# Patient Record
Sex: Female | Born: 1995 | Race: Black or African American | Hispanic: No | Marital: Single | Smoking: Never smoker
Health system: Southern US, Community
[De-identification: ages and names within clinical notes are randomized; demographics above are authoritative.]

## PROBLEM LIST (undated history)

## (undated) ENCOUNTER — Inpatient Hospital Stay (HOSPITAL_COMMUNITY): Payer: Self-pay

## (undated) ENCOUNTER — Ambulatory Visit (HOSPITAL_COMMUNITY): Admission: EM | Discharge status: Absent without leave | Payer: Managed Care, Other (non HMO)

## (undated) DIAGNOSIS — A549 Gonococcal infection, unspecified: Secondary | ICD-10-CM

## (undated) DIAGNOSIS — B999 Unspecified infectious disease: Secondary | ICD-10-CM

## (undated) DIAGNOSIS — A599 Trichomoniasis, unspecified: Secondary | ICD-10-CM

## (undated) DIAGNOSIS — N200 Calculus of kidney: Secondary | ICD-10-CM

## (undated) DIAGNOSIS — N390 Urinary tract infection, site not specified: Secondary | ICD-10-CM

## (undated) DIAGNOSIS — Z206 Contact with and (suspected) exposure to human immunodeficiency virus [HIV]: Secondary | ICD-10-CM

## (undated) HISTORY — PX: INDUCED ABORTION: SHX677

## (undated) HISTORY — DX: Contact with and (suspected) exposure to human immunodeficiency virus (hiv): Z20.6

## (undated) HISTORY — PX: WISDOM TOOTH EXTRACTION: SHX21

## (undated) HISTORY — DX: Urinary tract infection, site not specified: N39.0

---
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
## 2013-09-26 ENCOUNTER — Encounter (HOSPITAL_COMMUNITY): Payer: Self-pay | Admitting: Emergency Medicine

## 2013-09-26 ENCOUNTER — Other Ambulatory Visit (HOSPITAL_COMMUNITY)
Admission: RE | Admit: 2013-09-26 | Discharge: 2013-09-26 | Disposition: A | Discharge status: Home / Self Care | Payer: Managed Care, Other (non HMO) | Source: Ambulatory Visit | Attending: Family Medicine | Admitting: Family Medicine

## 2013-09-26 ENCOUNTER — Emergency Department (INDEPENDENT_AMBULATORY_CARE_PROVIDER_SITE_OTHER)
Admission: EM | Admit: 2013-09-26 | Discharge: 2013-09-26 | Disposition: A | Discharge status: Home / Self Care | Payer: Managed Care, Other (non HMO) | Source: Home / Self Care

## 2013-09-26 ENCOUNTER — Emergency Department (INDEPENDENT_AMBULATORY_CARE_PROVIDER_SITE_OTHER): Payer: Managed Care, Other (non HMO)

## 2013-09-26 DIAGNOSIS — N760 Acute vaginitis: Secondary | ICD-10-CM | POA: Insufficient documentation

## 2013-09-26 DIAGNOSIS — R109 Unspecified abdominal pain: Secondary | ICD-10-CM

## 2013-09-26 DIAGNOSIS — Z113 Encounter for screening for infections with a predominantly sexual mode of transmission: Secondary | ICD-10-CM | POA: Insufficient documentation

## 2013-09-26 DIAGNOSIS — R52 Pain, unspecified: Secondary | ICD-10-CM

## 2013-09-26 LAB — POCT URINALYSIS DIP (DEVICE)
Bilirubin Urine: NEGATIVE
Glucose, UA: NEGATIVE mg/dL
Ketones, ur: NEGATIVE mg/dL
Nitrite: NEGATIVE
Protein, ur: 30 mg/dL — AB
Specific Gravity, Urine: >=1.03 (ref 1.005–1.030)
Urobilinogen, UA: 1 mg/dL (ref 0.0–1.0)
pH: 5.5 (ref 5.0–8.0)

## 2013-09-26 LAB — POCT PREGNANCY, URINE: Preg Test, Ur: NEGATIVE

## 2013-09-26 MED ORDER — CIPROFLOXACIN HCL 500 MG PO TABS: 500.0000 mg | ORAL_TABLET | Freq: Two times a day (BID) | ORAL | Status: DC

## 2013-09-26 MED ORDER — ALIGN 4 MG PO CAPS: 1.0000 | ORAL_CAPSULE | Freq: Every day | ORAL | Status: DC

## 2013-09-26 MED ORDER — CEFTRIAXONE SODIUM 1 G IJ SOLR
1.0000 g | Freq: Once | INTRAMUSCULAR | Status: AC
  Administered 2013-09-26: 1 g via INTRAMUSCULAR

## 2013-09-26 MED ORDER — ACETAMINOPHEN 325 MG PO TABS
650.0000 mg | ORAL_TABLET | Freq: Once | ORAL | Status: AC
  Administered 2013-09-26: 650 mg via ORAL

## 2013-09-26 MED ORDER — CEFTRIAXONE SODIUM 1 G IJ SOLR
INTRAMUSCULAR | Status: AC
  Filled 2013-09-26: qty 10

## 2013-09-26 MED ORDER — ACETAMINOPHEN 325 MG PO TABS
ORAL_TABLET | ORAL | Status: AC
  Filled 2013-09-26: qty 2

## 2013-09-26 MED ORDER — LIDOCAINE HCL (PF) 1 % IJ SOLN
INTRAMUSCULAR | Status: AC
  Filled 2013-09-26: qty 5

## 2013-09-26 NOTE — ED Notes (Signed)
Call back number for lab issues verified 

## 2013-09-26 NOTE — Discharge Instructions (Signed)
Pyelonephritis, Adult °Pyelonephritis is a kidney infection. In general, there are 2 main types of pyelonephritis: °· Infections that come on quickly without any warning (acute pyelonephritis). °· Infections that persist for a long period of time (chronic pyelonephritis). °CAUSES  °Two main causes of pyelonephritis are: °· Bacteria traveling from the bladder to the kidney. This is a problem especially in pregnant women. The urine in the bladder can become filled with bacteria from multiple causes, including: °¨ Inflammation of the prostate gland (prostatitis). °¨ Sexual intercourse in females. °¨ Bladder infection (cystitis). °· Bacteria traveling from the bloodstream to the tissue part of the kidney. °Problems that may increase your risk of getting a kidney infection include: °· Diabetes. °· Kidney stones or bladder stones. °· Cancer. °· Catheters placed in the bladder. °· Other abnormalities of the kidney or ureter. °SYMPTOMS  °· Abdominal pain. °· Pain in the side or flank area. °· Fever. °· Chills. °· Upset stomach. °· Blood in the urine (dark urine). °· Frequent urination. °· Strong or persistent urge to urinate. °· Burning or stinging when urinating. °DIAGNOSIS  °Your caregiver may diagnose your kidney infection based on your symptoms. A urine sample may also be taken. °TREATMENT  °In general, treatment depends on how severe the infection is.  °· If the infection is mild and caught early, your caregiver may treat you with oral antibiotics and send you home. °· If the infection is more severe, the bacteria may have gotten into the bloodstream. This will require intravenous (IV) antibiotics and a hospital stay. Symptoms may include: °¨ High fever. °¨ Severe flank pain. °¨ Shaking chills. °· Even after a hospital stay, your caregiver may require you to be on oral antibiotics for a period of time. °· Other treatments may be required depending upon the cause of the infection. °HOME CARE INSTRUCTIONS  °· Take your  antibiotics as directed. Finish them even if you start to feel better. °· Make an appointment to have your urine checked to make sure the infection is gone. °· Drink enough fluids to keep your urine clear or pale yellow. °· Take medicines for the bladder if you have urgency and frequency of urination as directed by your caregiver. °SEEK IMMEDIATE MEDICAL CARE IF:  °· You have a fever or persistent symptoms for more than 2-3 days. °· You have a fever and your symptoms suddenly get worse. °· You are unable to take your antibiotics or fluids. °· You develop shaking chills. °· You experience extreme weakness or fainting. °· There is no improvement after 2 days of treatment. °MAKE SURE YOU: °· Understand these instructions. °· Will watch your condition. °· Will get help right away if you are not doing well or get worse. °Document Released: 02/18/2005 Document Revised: 08/20/2011 Document Reviewed: 07/25/2010 °ExitCare® Patient Information ©2015 [employer]. This information is not intended to replace advice given to you by your health care provider. Make sure you discuss any questions you have with your health care provider. ° °

## 2013-09-26 NOTE — ED Notes (Signed)
Concern for poss STD; has pain w urination and vaginal d/c; NAD

## 2013-09-26 NOTE — ED Provider Notes (Signed)
CSN: [id number]     Arrival date & time 09/26/13  1140 History   None    Chief Complaint  Patient presents with  . SEXUALLY TRANSMITTED DISEASE   (Consider location/radiation/quality/duration/timing/severity/associated sxs/prior Treatment)  HPI  Patient is a 18 year old female presenting today with complaints of left-sided abdominal pain.  Patient states she had an onset of abdominal pain with fever and blood in her urine approximately 2 weeks ago. Patient states she thought it was "getting better", but now has left flank pain.  History reviewed. No pertinent past medical history. History reviewed. No pertinent past surgical history. History reviewed. No pertinent family history. History  Substance Use Topics  . Smoking status: Passive Smoke Exposure - Never Smoker  . Smokeless tobacco: Not on file  . Alcohol Use: No   OB History   Grav Para Term Preterm Abortions TAB SAB Ect Mult Living   1              Review of Systems  Constitutional: Positive for fever, chills and fatigue.  HENT: Negative.   Eyes: Negative.   Respiratory: Negative.  Negative for shortness of breath.   Gastrointestinal: Positive for abdominal pain. Negative for nausea, vomiting, diarrhea and constipation.  Endocrine: Negative.   Genitourinary: Positive for vaginal discharge. Negative for dysuria, vaginal pain, menstrual problem and pelvic pain.       Patient denies burning or frequency with urination.  Reports some vaginal discharge, with history of unprotected sex.  Musculoskeletal: Negative.   Skin: Negative.   Allergic/Immunologic: Negative.   Neurological: Negative.   Hematological: Negative.   Psychiatric/Behavioral: Negative.     Allergies  Review of patient's allergies indicates no known allergies.  Home Medications   Prior to Admission medications   Medication Sig Start Date End Date Taking? Authorizing Provider  ciprofloxacin (CIPRO) 500 MG tablet Take 1 tablet (500 mg total) by mouth 2  (two) times daily. 09/26/13   Weber Cooksatherine Charlane Westry, NP  Probiotic Product (ALIGN) 4 MG CAPS Take 1 capsule (4 mg total) by mouth daily. 09/26/13   Weber Cooksatherine Tamre Cass, NP   BP 114/65  Pulse 105  Temp(Src) 101.5 F (38.6 C) (Oral)  Resp 24  SpO2 99%  LMP 09/19/2013  Breastfeeding? Unknown  Physical Exam  Nursing note and vitals reviewed. Cardiovascular: Regular rhythm, normal heart sounds and intact distal pulses.  Exam reveals no gallop and no friction rub.   No murmur heard. Patient is tachycardic, most likely related to fever.  Pulmonary/Chest: Effort normal and breath sounds normal. No respiratory distress. She has no wheezes. She has no rales. She exhibits no tenderness.  Abdominal: Soft. Bowel sounds are normal. She exhibits no distension and no mass. There is tenderness. There is no rebound and no guarding.  Patient reports tenderness with palpation of left kidney. Positive for CVA tenderness on left side.  Genitourinary: Vagina normal and uterus normal.  Patient has nulliparous appearing cervix consistent with a retroverted uterus. Scant amount of white discharge noted in posterior fornix. Negative for cervical motion tenderness. No evidence of discomfort with palpation of either ovary or fallopian tube.    Skin: Skin is warm and dry.    ED Course  Procedures (including critical care time) Labs Review Labs Reviewed  POCT URINALYSIS DIP (DEVICE) - Abnormal; Notable for the following:    Hgb urine dipstick TRACE (*)    Protein, ur 30 (*)    Leukocytes, UA TRACE (*)    All other components within normal limits  URINE CULTURE  POCT PREGNANCY, URINE  CERVICOVAGINAL ANCILLARY ONLY   Results for orders placed during the hospital encounter of 09/26/13  POCT URINALYSIS DIP (DEVICE)      Result Value Ref Range   Glucose, UA NEGATIVE  NEGATIVE mg/dL   Bilirubin Urine NEGATIVE  NEGATIVE   Ketones, ur NEGATIVE  NEGATIVE mg/dL   Specific Gravity, Urine >=1.030  1.005 - 1.030   Hgb urine  dipstick TRACE (*) NEGATIVE   pH 5.5  5.0 - 8.0   Protein, ur 30 (*) NEGATIVE mg/dL   Urobilinogen, UA 1.0  0.0 - 1.0 mg/dL   Nitrite NEGATIVE  NEGATIVE   Leukocytes, UA TRACE (*) NEGATIVE  POCT PREGNANCY, URINE      Result Value Ref Range   Preg Test, Ur NEGATIVE  NEGATIVE    Imaging Review Dg Abd 1 View  09/26/2013   CLINICAL DATA:  Lower abdominal pain  EXAM: ABDOMEN - 1 VIEW  COMPARISON:  None.  FINDINGS: There is moderate stool throughout the colon. The bowel gas pattern is unremarkable. No obstruction or free air is seen on this supine examination. There is a probable small phlebolith in the lower left pelvis.  IMPRESSION: Moderate stool in colon.  Overall bowel gas pattern unremarkable.   Electronically Signed   By: Bretta Bang M.D.   On: 09/26/2013 13:19    MDM   1. Flank pain, acute    Examination is consistent with probable pyelonephritis. Dr. Denyse Amass consulted on plan of care. Patient be treated with IM Rocephin and Cipro. Patient advised to return if symptoms fail to improve or worsen. Tylenol as needed for fever and discomfort. The patient verbalizes understanding and agrees to plan of care.       Weber Cooks, NP 09/26/13 1408

## 2013-09-27 LAB — URINE CULTURE: Colony Count: 4000

## 2013-09-28 NOTE — ED Notes (Addendum)
GC/chlamydia neg., Affirm: Candida and Trich neg., Gardnerella pos. Message sent to Dr. Denyse Amassorey. Vassie MoselleYork, Madeline Wilkins M 09/28/2013 Dr. Denyse Amassorey wrote that he called in Rx. and notified the pt. 10/01/2013

## 2013-09-30 NOTE — ED Provider Notes (Signed)
Medical screening examination/treatment/procedure(s) were performed by a resident physician or non-physician practitioner and as the supervising physician I was immediately available for consultation/collaboration.  Dirk Vanaman, MD    Tyshauna Finkbiner S Lanecia Sliva, MD 09/30/13 0752 

## 2013-10-01 ENCOUNTER — Telehealth (HOSPITAL_COMMUNITY): Payer: Self-pay | Admitting: Family Medicine

## 2013-10-01 MED ORDER — METRONIDAZOLE 500 MG PO TABS: 500.0000 mg | ORAL_TABLET | Freq: Two times a day (BID) | ORAL | Status: DC

## 2013-10-01 NOTE — ED Notes (Signed)
Flagyl called in for BV  Rodolph BongEvan S Casey Maxfield, MD 10/01/13 [phone]

## 2013-10-11 ENCOUNTER — Telehealth (HOSPITAL_COMMUNITY): Payer: Self-pay | Admitting: *Deleted

## 2014-01-03 ENCOUNTER — Encounter (HOSPITAL_COMMUNITY): Payer: Self-pay | Admitting: Emergency Medicine

## 2014-03-15 ENCOUNTER — Other Ambulatory Visit (HOSPITAL_COMMUNITY)
Admission: RE | Admit: 2014-03-15 | Discharge: 2014-03-15 | Disposition: A | Discharge status: Home / Self Care | Payer: Managed Care, Other (non HMO) | Source: Ambulatory Visit | Attending: Emergency Medicine | Admitting: Emergency Medicine

## 2014-03-15 ENCOUNTER — Emergency Department (INDEPENDENT_AMBULATORY_CARE_PROVIDER_SITE_OTHER)
Admission: EM | Admit: 2014-03-15 | Discharge: 2014-03-15 | Disposition: A | Discharge status: Home / Self Care | Payer: Managed Care, Other (non HMO) | Source: Home / Self Care | Attending: Emergency Medicine | Admitting: Emergency Medicine

## 2014-03-15 ENCOUNTER — Encounter (HOSPITAL_COMMUNITY): Payer: Self-pay | Admitting: *Deleted

## 2014-03-15 DIAGNOSIS — N760 Acute vaginitis: Secondary | ICD-10-CM

## 2014-03-15 DIAGNOSIS — Z113 Encounter for screening for infections with a predominantly sexual mode of transmission: Secondary | ICD-10-CM | POA: Diagnosis present

## 2014-03-15 LAB — POCT PREGNANCY, URINE: Preg Test, Ur: NEGATIVE

## 2014-03-15 MED ORDER — METRONIDAZOLE 500 MG PO TABS: 500.0000 mg | ORAL_TABLET | Freq: Two times a day (BID) | ORAL | Status: DC

## 2014-03-15 NOTE — ED Notes (Signed)
3 gold tubes drawn for HIV/RPR, labeled and taken to the lab.

## 2014-03-15 NOTE — ED Notes (Signed)
STates she wants a pregnancy check.  LMP 11/18.  Missed period in Dec. Tried [facility] pregnancy test  and was neg. X 2.  Also wants checked for STD.  C/o vaginal discharge for 1 week.  D/C is white and creamy.  No UTI symptoms.

## 2014-03-15 NOTE — ED Provider Notes (Signed)
   Chief Complaint   Exposure to STD and Possible Pregnancy   History of Present Illness   Madeline Wilkins is a 19 year old female who comes in tonight for a pregnancy test also be checked for STDs and vaginal discharge and her last menstrual period was November 17 or 18th. She is sexually active with inconsistent use of condoms. She denies any pregnancy symptoms such as morning sickness, breast tenderness, breast swelling. She also has had a one-week history of a white vaginal discharge. She denies any itching or odor. No pelvic or lower back pain. No urinary symptoms. No fever, chills, nausea, or vomiting. The patient has a history of BV in the past and Chlamydia or gonorrhea. She had a pregnancy termination about a year ago.  Review of Systems   Other than as noted above, the patient denies any of the following symptoms: Systemic:  No fever or chills GI:  No abdominal pain, nausea, vomiting, diarrhea, constipation, melena or hematochezia. GU:  No dysuria, frequency, urgency, hematuria, vaginal discharge, itching, or abnormal vaginal bleeding.  PMFSH   Past medical history, family history, social history, meds, and allergies were reviewed.    Physical Examination    Vital signs:  BP 118/58 mmHg  Pulse 82  Temp(Src) 98.4 F (36.9 C) (Oral)  Resp 18  SpO2 100%  LMP 01/21/2014  Breastfeeding? No General:  Alert, oriented and in no distress. Lungs:  Breath sounds clear and equal bilaterally.  No wheezes, rales or rhonchi. Heart:  Regular rhythm.  No gallops or murmers. Abdomen:  Soft, flat and non-distended.  No organomegaly or mass.  No tenderness, guarding or rebound.  Bowel sounds normally active. Pelvic exam:  Normal external genitalia, vaginal and cervical mucosa were normal. There was a small amount of white discharge, this was non-frothy and non-malodorous. No pain on cervical motion. Uterus was normal in size and shape and nontender. No adnexal masses or tenderness.  DNA probes  for gonorrhea, Chlamydia, Trichomonas, Gardnerella, Candida were obtained. Skin:  Clear, warm and dry.  Chaperoned by Cherly AndersonSuzanne York, RN who was present throughout the pelvic exam.   Labs   Results for orders placed or performed during the hospital encounter of 03/15/14  Pregnancy, urine POC  Result Value Ref Range   Preg Test, Ur NEGATIVE NEGATIVE    Serologies for HIV and RPR were obtained.  Assessment   The encounter diagnosis was Vaginitis.       Plan    1.  Meds:  The following meds were prescribed:   Discharge Medication List as of 03/15/2014  6:58 PM    START taking these medications   Details  !! metroNIDAZOLE (FLAGYL) 500 MG tablet Take 1 tablet (500 mg total) by mouth 2 (two) times daily., Starting 03/15/2014, Until Discontinued, Normal     !! - Potential duplicate medications found. Please discuss with provider.      2.  Patient Education/Counseling:  The patient was given appropriate handouts, self care instructions, and instructed in symptomatic relief.    3.  Follow up:  The patient was told to follow up here if no better in 3 to 4 days, or sooner if becoming worse in any way, and given some red flag symptoms such as worsening pain, fever, persistent vomiting, or heavy vaginal bleeding which would prompt immediate return.       Reuben Likesavid C Lige Lakeman, MD 03/15/14 [phone]

## 2014-03-15 NOTE — Discharge Instructions (Signed)
Bacterial Vaginosis °Bacterial vaginosis is a vaginal infection that occurs when the normal balance of bacteria in the vagina is disrupted. It results from an overgrowth of certain bacteria. This is the most common vaginal infection in women of childbearing age. Treatment is important to prevent complications, especially in pregnant women, as it can cause a premature delivery. °CAUSES  °Bacterial vaginosis is caused by an increase in harmful bacteria that are normally present in smaller amounts in the vagina. Several different kinds of bacteria can cause bacterial vaginosis. However, the reason that the condition develops is not fully understood. °RISK FACTORS °Certain activities or behaviors can put you at an increased risk of developing bacterial vaginosis, including: °· Having a new sex partner or multiple sex partners. °· Douching. °· Using an intrauterine device (IUD) for contraception. °Women do not get bacterial vaginosis from toilet seats, bedding, swimming pools, or contact with objects around them. °SIGNS AND SYMPTOMS  °Some women with bacterial vaginosis have no signs or symptoms. Common symptoms include: °· Grey vaginal discharge. °· A fishlike odor with discharge, especially after sexual intercourse. °· Itching or burning of the vagina and vulva. °· Burning or pain with urination. °DIAGNOSIS  °Your health care provider will take a medical history and examine the vagina for signs of bacterial vaginosis. A sample of vaginal fluid may be taken. Your health care provider will look at this sample under a microscope to check for bacteria and abnormal cells. A vaginal pH test may also be done.  °TREATMENT  °Bacterial vaginosis may be treated with antibiotic medicines. These may be given in the form of a pill or a vaginal cream. A second round of antibiotics may be prescribed if the condition comes back after treatment.  °HOME CARE INSTRUCTIONS  °· Only take over-the-counter or prescription medicines as  directed by your health care provider. °· If antibiotic medicine was prescribed, take it as directed. Make sure you finish it even if you start to feel better. °· Do not have sex until treatment is completed. °· Tell all sexual partners that you have a vaginal infection. They should see their health care provider and be treated if they have problems, such as a mild rash or itching. °· Practice safe sex by using condoms and only having one sex partner. °SEEK MEDICAL CARE IF:  °· Your symptoms are not improving after 3 days of treatment. °· You have increased discharge or pain. °· You have a fever. °MAKE SURE YOU:  °· Understand these instructions. °· Will watch your condition. °· Will get help right away if you are not doing well or get worse. °FOR MORE INFORMATION  °Centers for Disease Control and Prevention, Division of STD Prevention: www.cdc.gov/std °American Sexual Health Association (ASHA): www.ashastd.org  °Document Released: 02/18/2005 Document Revised: 12/09/2012 Document Reviewed: 09/30/2012 °ExitCare® Patient Information ©2015 [employer]. This information is not intended to replace advice given to you by your health care provider. Make sure you discuss any questions you have with your health care provider. ° °To restore the normal balance of "good bacteria" in your system.  Take a probiotic once daily.  These can be gotten over the counter at the drug store without a prescription and come under various brand names such as Culturelle, Align, Florastore, and Phillips.  The best thing to do is to ask your pharmacist to recommend a good probiotic that is not too expensive.  ° °

## 2014-03-16 LAB — CERVICOVAGINAL ANCILLARY ONLY
Chlamydia: NEGATIVE
Neisseria Gonorrhea: NEGATIVE

## 2014-03-17 LAB — CERVICOVAGINAL ANCILLARY ONLY
Wet Prep (BD Affirm): NEGATIVE
Wet Prep (BD Affirm): NEGATIVE
Wet Prep (BD Affirm): POSITIVE — AB

## 2014-03-17 LAB — RPR: RPR Ser Ql: NONREACTIVE

## 2014-03-17 LAB — HIV ANTIBODY (ROUTINE TESTING W REFLEX)
HIV 1/O/2 Abs-Index Value: <1 (ref <1.00)
HIV-1/HIV-2 Ab: NONREACTIVE

## 2014-03-19 NOTE — ED Notes (Addendum)
GC/Chlamydia neg., Affirm: Candida and Trich neg., Gardnerella pos., HIV/RPR non-reactive.  Pt. adequately treated with Flagyl. Madeline Wilkins, Cameryn Chrisley M 03/19/2014

## 2014-08-26 ENCOUNTER — Other Ambulatory Visit (HOSPITAL_COMMUNITY)
Admission: RE | Admit: 2014-08-26 | Discharge: 2014-08-26 | Disposition: A | Discharge status: Home / Self Care | Payer: Managed Care, Other (non HMO) | Source: Ambulatory Visit | Attending: Family Medicine | Admitting: Family Medicine

## 2014-08-26 ENCOUNTER — Emergency Department (INDEPENDENT_AMBULATORY_CARE_PROVIDER_SITE_OTHER)
Admission: EM | Admit: 2014-08-26 | Discharge: 2014-08-26 | Disposition: A | Discharge status: Home / Self Care | Payer: Managed Care, Other (non HMO) | Source: Home / Self Care | Attending: Family Medicine | Admitting: Family Medicine

## 2014-08-26 ENCOUNTER — Encounter (HOSPITAL_COMMUNITY): Payer: Self-pay | Admitting: Emergency Medicine

## 2014-08-26 DIAGNOSIS — N760 Acute vaginitis: Secondary | ICD-10-CM | POA: Diagnosis present

## 2014-08-26 DIAGNOSIS — Z113 Encounter for screening for infections with a predominantly sexual mode of transmission: Secondary | ICD-10-CM | POA: Diagnosis present

## 2014-08-26 DIAGNOSIS — A749 Chlamydial infection, unspecified: Secondary | ICD-10-CM

## 2014-08-26 LAB — POCT PREGNANCY, URINE: Preg Test, Ur: NEGATIVE

## 2014-08-26 MED ORDER — LIDOCAINE HCL (PF) 1 % IJ SOLN
INTRAMUSCULAR | Status: AC
  Filled 2014-08-26: qty 5

## 2014-08-26 MED ORDER — CEFTRIAXONE SODIUM 250 MG IJ SOLR
INTRAMUSCULAR | Status: AC
  Filled 2014-08-26: qty 250

## 2014-08-26 MED ORDER — CEFTRIAXONE SODIUM 250 MG IJ SOLR
250.0000 mg | Freq: Once | INTRAMUSCULAR | Status: AC
  Administered 2014-08-26: 250 mg via INTRAMUSCULAR

## 2014-08-26 MED ORDER — AZITHROMYCIN 250 MG PO TABS
ORAL_TABLET | ORAL | Status: AC
  Filled 2014-08-26: qty 4

## 2014-08-26 MED ORDER — AZITHROMYCIN 250 MG PO TABS
1000.0000 mg | ORAL_TABLET | Freq: Once | ORAL | Status: AC
  Administered 2014-08-26: 1000 mg via ORAL

## 2014-08-26 NOTE — ED Notes (Signed)
Pt did not wait the 20 minutes b/c her ride was leaving

## 2014-08-26 NOTE — Discharge Instructions (Signed)
Thank you for coming in today. If your belly pain worsens, or you have high fever, bad vomiting, blood in your stool or black tarry stool go to the Emergency Room.   Follow up with a primary or OBGYN doctor.  Consider using more reliable methods of birth control.    Chlamydia Chlamydia is an infection. It is spread through sexual contact. Chlamydia can be in different areas of the body. These areas include the cervix, urethra, throat, or rectum. You may not know you have chlamydia because many people never develop the symptoms. Chlamydia is not difficult to treat once you know you have it. However, if it is left untreated, chlamydia can lead to more serious health problems.  CAUSES  Chlamydia is caused by bacteria. It is a sexually transmitted disease. It is passed from an infected partner during intimate contact. This contact could be with the genitals, mouth, or rectal area. Chlamydia can also be passed from mothers to babies during birth. SIGNS AND SYMPTOMS  There may not be any symptoms. This is often the case early in the infection. If symptoms develop, they may include:  Mild pain and discomfort when urinating.  Redness, soreness, and swelling (inflammation) of the rectum.  Vaginal discharge.  Painful intercourse.  Abdominal pain.  Bleeding between menstrual periods. DIAGNOSIS  To diagnose this infection, your health care provider will do a pelvic exam. Cultures will be taken of the vagina, cervix, urine, and possibly the rectum to verify the diagnosis.  TREATMENT You will be given antibiotic medicines. If you are pregnant, certain types of antibiotics will need to be avoided. Any sexual partners should also be treated, even if they do not show symptoms.  HOME CARE INSTRUCTIONS   Take your antibiotic medicine as directed by your health care provider. Finish the antibiotic even if you start to feel better.  Take medicines only as directed by your health care provider.  Inform  any sexual partners about the infection. They should also be treated.  Do not have sexual contact until your health care provider tells you it is okay.  Get plenty of rest.  Eat a well-balanced diet.  Drink enough fluids to keep your urine clear or pale yellow.  Keep all follow-up visits as directed by your health care provider. SEEK MEDICAL CARE IF:  You have painful urination.  You have abdominal pain.  You have vaginal discharge.  You have painful sexual intercourse.  You have bleeding between periods and after sex.  You have a fever. SEEK IMMEDIATE MEDICAL CARE IF:   You experience nausea or vomiting.  You experience excessive sweating (diaphoresis).  You have difficulty swallowing. MAKE SURE YOU:   Understand these instructions.  Will watch your condition.  Will get help right away if you are not doing well or get worse. Document Released: 11/28/2004 Document Revised: 07/05/2013 Document Reviewed: 10/26/2012 [facility] Patient Information 2015 [employer], [place]. This information is not intended to replace advice given to you by your health care provider. Make sure you discuss any questions you have with your health care provider.  [facility] [address] [place], [place] [phone]  [facility] [address] [place], [place] [phone]  [facility]: Silverio Lay MD [address] [place], [place] [phone]  [facility] [address] [place], [place] [phone]  [facility]: Marcelle Overlie MD [address] [place], [place] [phone]  [facility]: Ambrose Mantle  Malachi Pro MD [address] [place], [place] [phone]  [facility] [address] [place], [place] [phone]   [facility]: [address], [place], [place] [address] [phone]

## 2014-08-26 NOTE — ED Notes (Signed)
Patient unable to void at this time to provide specimens

## 2014-08-26 NOTE — ED Notes (Signed)
Here for a STD exposure... Partner called and notified he is being treated for Chlamydia She is asymptomatic Alert, no signs of acute distress.

## 2014-08-26 NOTE — ED Provider Notes (Signed)
Madeline Wilkins is a 19 y.o. female who presents to Urgent Care today for chlamydia. Patient notes that her boyfriend recently tested positive for Chlamydia. She suspects that she has Chlamydia. She currently is asymptomatic. She currently is having her menstrual period she uses condoms intermittently for protection. No fevers chills nausea vomiting or diarrhea. She feels well otherwise.   History reviewed. No pertinent past medical history. History reviewed. No pertinent past surgical history. History  Substance Use Topics  . Smoking status: Passive Smoke Exposure - Never Smoker  . Smokeless tobacco: Not on file  . Alcohol Use: No   ROS as above Medications: No current facility-administered medications for this encounter.   Current Outpatient Prescriptions  Medication Sig Dispense Refill  . ciprofloxacin (CIPRO) 500 MG tablet Take 1 tablet (500 mg total) by mouth 2 (two) times daily. 14 tablet 0  . metroNIDAZOLE (FLAGYL) 500 MG tablet Take 1 tablet (500 mg total) by mouth 2 (two) times daily. 14 tablet 0  . metroNIDAZOLE (FLAGYL) 500 MG tablet Take 1 tablet (500 mg total) by mouth 2 (two) times daily. 14 tablet 0  . Probiotic Product (ALIGN) 4 MG CAPS Take 1 capsule (4 mg total) by mouth daily. 30 capsule 2   No Known Allergies   Exam:  BP 118/86 mmHg  Pulse 78  Temp(Src) 99.2 F (37.3 C) (Oral)  Resp 12  SpO2 99%  LMP 08/26/2014 Gen: Well NAD HEENT: EOMI,  MMM Lungs: Normal work of breathing. CTABL Heart: RRR no MRG Abd: NABS, Soft. Nondistended, Nontender Exts: Brisk capillary refill, warm and well perfused.   Patient was given 1 g oral azithromycin, and 250 mg intermuscular ceftriaxone  Results for orders placed or performed during the hospital encounter of 08/26/14 (from the past 24 hour(s))  Pregnancy, urine POC     Status: None   Collection Time: 08/26/14  3:06 PM  Result Value Ref Range   Preg Test, Ur NEGATIVE NEGATIVE   No results found.  Assessment and  Plan: 19 y.o. female with Chlamydia. Treatment as above. Urine cytology for gonorrhea Chlamydia Trichomonas HIV and RPR serology pending. Patient declined pelvic exam today.  Discussed warning signs or symptoms. Please see discharge instructions. Patient expresses understanding.     Rodolph Bong, MD 08/26/14 [phone]

## 2014-08-27 LAB — HIV ANTIBODY (ROUTINE TESTING W REFLEX): HIV Screen 4th Generation wRfx: NONREACTIVE

## 2014-08-27 LAB — RPR: RPR Ser Ql: NONREACTIVE

## 2014-08-29 LAB — URINE CYTOLOGY ANCILLARY ONLY
Chlamydia: NEGATIVE
Neisseria Gonorrhea: NEGATIVE
Trichomonas: NEGATIVE

## 2014-08-29 NOTE — ED Notes (Signed)
Review of labs negative for GC, chlamydia,

## 2014-08-29 NOTE — ED Notes (Signed)
Still waiting on RPR and HIV reports

## 2014-08-29 NOTE — ED Notes (Signed)
RPR and HIV reports are negative

## 2014-10-04 ENCOUNTER — Encounter (HOSPITAL_COMMUNITY): Payer: Self-pay | Admitting: Family

## 2014-10-04 ENCOUNTER — Inpatient Hospital Stay (HOSPITAL_COMMUNITY)
Admission: AD | Admit: 2014-10-04 | Discharge: 2014-10-04 | Disposition: A | Discharge status: Home / Self Care | Payer: Managed Care, Other (non HMO) | Source: Ambulatory Visit | Attending: Family Medicine | Admitting: Family Medicine

## 2014-10-04 DIAGNOSIS — Z3202 Encounter for pregnancy test, result negative: Secondary | ICD-10-CM | POA: Insufficient documentation

## 2014-10-04 DIAGNOSIS — Z3200 Encounter for pregnancy test, result unknown: Secondary | ICD-10-CM | POA: Diagnosis present

## 2014-10-04 DIAGNOSIS — B9689 Other specified bacterial agents as the cause of diseases classified elsewhere: Secondary | ICD-10-CM

## 2014-10-04 DIAGNOSIS — A499 Bacterial infection, unspecified: Secondary | ICD-10-CM | POA: Diagnosis not present

## 2014-10-04 DIAGNOSIS — N760 Acute vaginitis: Secondary | ICD-10-CM | POA: Diagnosis not present

## 2014-10-04 LAB — WET PREP, GENITAL
Trich, Wet Prep: NONE SEEN
Yeast Wet Prep HPF POC: NONE SEEN

## 2014-10-04 LAB — POCT PREGNANCY, URINE: Preg Test, Ur: NEGATIVE

## 2014-10-04 MED ORDER — METRONIDAZOLE 500 MG PO TABS: 500.0000 mg | ORAL_TABLET | Freq: Two times a day (BID) | ORAL | Status: DC

## 2014-10-04 NOTE — MAU Note (Signed)
Missed period last month or 2.  Neg home test.

## 2014-10-04 NOTE — MAU Note (Signed)
Pt not in lobby.  

## 2014-10-04 NOTE — Discharge Instructions (Signed)
Bacterial Vaginosis Bacterial vaginosis is a vaginal infection that occurs when the normal balance of bacteria in the vagina is disrupted. It results from an overgrowth of certain bacteria. This is the most common vaginal infection in women of childbearing age. Treatment is important to prevent complications, especially in pregnant women, as it can cause a premature delivery. CAUSES  Bacterial vaginosis is caused by an increase in harmful bacteria that are normally present in smaller amounts in the vagina. Several different kinds of bacteria can cause bacterial vaginosis. However, the reason that the condition develops is not fully understood. RISK FACTORS Certain activities or behaviors can put you at an increased risk of developing bacterial vaginosis, including:  Having a new sex partner or multiple sex partners.  Douching.  Using an intrauterine device (IUD) for contraception. Women do not get bacterial vaginosis from toilet seats, bedding, swimming pools, or contact with objects around them. SIGNS AND SYMPTOMS  Some women with bacterial vaginosis have no signs or symptoms. Common symptoms include:  Grey vaginal discharge.  A fishlike odor with discharge, especially after sexual intercourse.  Itching or burning of the vagina and vulva.  Burning or pain with urination. DIAGNOSIS  Your health care provider will take a medical history and examine the vagina for signs of bacterial vaginosis. A sample of vaginal fluid may be taken. Your health care provider will look at this sample under a microscope to check for bacteria and abnormal cells. A vaginal pH test may also be done.  TREATMENT  Bacterial vaginosis may be treated with antibiotic medicines. These may be given in the form of a pill or a vaginal cream. A second round of antibiotics may be prescribed if the condition comes back after treatment.  HOME CARE INSTRUCTIONS   Only take over-the-counter or prescription medicines as  directed by your health care provider.  If antibiotic medicine was prescribed, take it as directed. Make sure you finish it even if you start to feel better.  Do not have sex until treatment is completed.  Tell all sexual partners that you have a vaginal infection. They should see their health care provider and be treated if they have problems, such as a mild rash or itching.  Practice safe sex by using condoms and only having one sex partner. SEEK MEDICAL CARE IF:   Your symptoms are not improving after 3 days of treatment.  You have increased discharge or pain.  You have a fever. MAKE SURE YOU:   Understand these instructions.  Will watch your condition.  Will get help right away if you are not doing well or get worse. FOR MORE INFORMATION  Centers for Disease Control and Prevention, Division of STD Prevention: www.cdc.gov/std American Sexual Health Association (ASHA): www.ashastd.org  Document Released: 02/18/2005 Document Revised: 12/09/2012 Document Reviewed: 09/30/2012 [employer] Patient Information 2015 [employer]. This information is not intended to replace advice given to you by your health care provider. Make sure you discuss any questions you have with your health care provider.  

## 2014-10-04 NOTE — MAU Provider Note (Signed)
S:  Madeline Wilkins is a 19 y.o.  here for confirmation of pregnancy.  Patient's Patient's last menstrual period was 08/24/2014.  Denies any vaginal bleeding or abdominal pain.  +white vaginal discharge with odor.    O:  No past medical history on file.  No family history on file.   Filed Vitals:   10/04/14 1446  BP: 112/64  Pulse: 81  Temp: 97.4 F (36.3 C)  Resp: 20   General:  A&OX3 with no signs of acute distress. She appears well-developed and well-nourished. No distress.  Neck: Normal range of motion.  Pulmonary/Chest: Effort normal. No respiratory distress.  Musculoskeletal: Normal range of motion.  GU:  White creamy discharge, no vaginal bleeding Neurological: She is alert and oriented to person, place, and time.  Skin: Skin is warm and dry.   A: Bacterial Vaginosis  Plan: Discharge to home Pt declines Family Planning appt - desires pregnancy RX Flagyl 500 mg BID x 7 days Follow-up for worsening or no improvement in symptoms Marlis Edelson, CNM

## 2014-12-08 ENCOUNTER — Other Ambulatory Visit (HOSPITAL_COMMUNITY)
Admission: RE | Admit: 2014-12-08 | Discharge: 2014-12-08 | Disposition: A | Discharge status: Home / Self Care | Payer: Managed Care, Other (non HMO) | Source: Ambulatory Visit | Attending: Family Medicine | Admitting: Family Medicine

## 2014-12-08 ENCOUNTER — Emergency Department (INDEPENDENT_AMBULATORY_CARE_PROVIDER_SITE_OTHER)
Admission: EM | Admit: 2014-12-08 | Discharge: 2014-12-08 | Disposition: A | Discharge status: Home / Self Care | Payer: Managed Care, Other (non HMO) | Source: Home / Self Care | Attending: Family Medicine | Admitting: Family Medicine

## 2014-12-08 DIAGNOSIS — B373 Candidiasis of vulva and vagina: Secondary | ICD-10-CM

## 2014-12-08 DIAGNOSIS — N760 Acute vaginitis: Secondary | ICD-10-CM | POA: Insufficient documentation

## 2014-12-08 DIAGNOSIS — Z113 Encounter for screening for infections with a predominantly sexual mode of transmission: Secondary | ICD-10-CM | POA: Diagnosis present

## 2014-12-08 DIAGNOSIS — B3731 Acute candidiasis of vulva and vagina: Secondary | ICD-10-CM

## 2014-12-08 LAB — POCT URINALYSIS DIP (DEVICE)
Bilirubin Urine: NEGATIVE
Glucose, UA: NEGATIVE mg/dL
Ketones, ur: NEGATIVE mg/dL
Nitrite: NEGATIVE
Protein, ur: NEGATIVE mg/dL
Specific Gravity, Urine: 1.02 (ref 1.005–1.030)
Urobilinogen, UA: 0.2 mg/dL (ref 0.0–1.0)
pH: 6 (ref 5.0–8.0)

## 2014-12-08 LAB — POCT PREGNANCY, URINE: Preg Test, Ur: NEGATIVE

## 2014-12-08 MED ORDER — FLUCONAZOLE 150 MG PO TABS: 150.0000 mg | ORAL_TABLET | Freq: Once | ORAL | Status: DC

## 2014-12-08 MED ORDER — TERCONAZOLE 80 MG VA SUPP: 80.0000 mg | Freq: Every day | VAGINAL | Status: DC

## 2014-12-08 NOTE — ED Notes (Signed)
Pt    Reports      Vag     Discharge         X  2   Days           denys   Any  Other  Symptoms  -  Pt  Ambulated  To  Room  With steady  Fluid  Gait     Appears   In no   Acute  Distress         Sitting   Upright on the  Exam table  Speaking    In   Complete   sentances

## 2014-12-08 NOTE — ED Provider Notes (Signed)
CSN: [id number]     Arrival date & time 12/08/14  1308 History   First MD Initiated Contact with Patient 12/08/14 1342     Chief Complaint  Patient presents with  . Vaginal Discharge   (Consider location/radiation/quality/duration/timing/severity/associated sxs/prior Treatment) Patient is a 19 y.o. female presenting with vaginal discharge. The history is provided by the patient.  Vaginal Discharge Quality:  White and thick Severity:  Moderate Onset quality:  Sudden Duration:  2 days Chronicity:  New Relieved by:  None tried Worsened by:  Nothing tried Ineffective treatments:  None tried Associated symptoms: no abdominal pain, no fever and no rash   Risk factors: unprotected sex     No past medical history on file. No past surgical history on file. No family history on file. Social History  Substance Use Topics  . Smoking status: Passive Smoke Exposure - Never Smoker  . Smokeless tobacco: Not on file  . Alcohol Use: No   OB History    Gravida Para Term Preterm AB TAB SAB Ectopic Multiple Living   1              Review of Systems  Constitutional: Negative for fever.  Gastrointestinal: Negative.  Negative for abdominal pain.  Genitourinary: Positive for vaginal discharge. Negative for vaginal bleeding and menstrual problem.  All other systems reviewed and are negative.   Allergies  Review of patient's allergies indicates no known allergies.  Home Medications   Prior to Admission medications   Medication Sig Start Date End Date Taking? Authorizing Provider  fluconazole (DIFLUCAN) 150 MG tablet Take 1 tablet (150 mg total) by mouth once. Repeat in 1 wk if needed 12/08/14   Linna Hoff, MD  metroNIDAZOLE (FLAGYL) 500 MG tablet Take 1 tablet (500 mg total) by mouth 2 (two) times daily. 10/04/14   Marlis Edelson, CNM  terconazole (TERAZOL 3) 80 MG vaginal suppository Place 1 suppository (80 mg total) vaginally at bedtime. 12/08/14   Linna Hoff, MD   Meds Ordered and  Administered this Visit  Medications - No data to display  BP 115/76 mmHg  Pulse 80  Temp(Src) 98.4 F (36.9 C) (Oral)  Resp 16  SpO2 100%  LMP 11/24/2014 No data found.   Physical Exam  Constitutional: She is oriented to person, place, and time. She appears well-developed and well-nourished.  Genitourinary: Uterus normal. Pelvic exam was performed with patient supine. Cervix exhibits discharge. Right adnexum displays no tenderness. Left adnexum displays no tenderness. No erythema or tenderness in the vagina. No signs of injury around the vagina. Vaginal discharge found.    Neurological: She is alert and oriented to person, place, and time.  Skin: Skin is warm and dry.  Nursing note and vitals reviewed.   ED Course  Procedures (including critical care time)  Labs Review Labs Reviewed  POCT URINALYSIS DIP (DEVICE) - Abnormal; Notable for the following:    Hgb urine dipstick TRACE (*)    Leukocytes, UA SMALL (*)    All other components within normal limits  POCT PREGNANCY, URINE  CERVICOVAGINAL ANCILLARY ONLY    Imaging Review No results found.   Visual Acuity Review  Right Eye Distance:   Left Eye Distance:   Bilateral Distance:    Right Eye Near:   Left Eye Near:    Bilateral Near:         MDM   1. Candida vaginitis        Linna Hoff, MD 12/08/14 1419

## 2014-12-09 LAB — CERVICOVAGINAL ANCILLARY ONLY
Chlamydia: NEGATIVE
Neisseria Gonorrhea: NEGATIVE
Wet Prep (BD Affirm): POSITIVE — AB

## 2014-12-12 NOTE — ED Notes (Signed)
Final report of vaginal swab positive for gardnerella only . Treatment  Adequate w Rx provided day of visit

## 2015-02-05 ENCOUNTER — Emergency Department (INDEPENDENT_AMBULATORY_CARE_PROVIDER_SITE_OTHER)
Admission: EM | Admit: 2015-02-05 | Discharge: 2015-02-05 | Disposition: A | Discharge status: Home / Self Care | Payer: Managed Care, Other (non HMO) | Source: Home / Self Care

## 2015-02-05 ENCOUNTER — Other Ambulatory Visit (HOSPITAL_COMMUNITY)
Admission: RE | Admit: 2015-02-05 | Discharge: 2015-02-05 | Disposition: A | Discharge status: Home / Self Care | Payer: Managed Care, Other (non HMO) | Source: Ambulatory Visit | Attending: Family Medicine | Admitting: Family Medicine

## 2015-02-05 ENCOUNTER — Encounter (HOSPITAL_COMMUNITY): Payer: Self-pay | Admitting: Emergency Medicine

## 2015-02-05 DIAGNOSIS — Z113 Encounter for screening for infections with a predominantly sexual mode of transmission: Secondary | ICD-10-CM | POA: Insufficient documentation

## 2015-02-05 DIAGNOSIS — B373 Candidiasis of vulva and vagina: Secondary | ICD-10-CM

## 2015-02-05 DIAGNOSIS — Z3490 Encounter for supervision of normal pregnancy, unspecified, unspecified trimester: Secondary | ICD-10-CM

## 2015-02-05 DIAGNOSIS — Z3201 Encounter for pregnancy test, result positive: Secondary | ICD-10-CM

## 2015-02-05 DIAGNOSIS — N760 Acute vaginitis: Secondary | ICD-10-CM | POA: Diagnosis present

## 2015-02-05 DIAGNOSIS — B3731 Acute candidiasis of vulva and vagina: Secondary | ICD-10-CM

## 2015-02-05 LAB — POCT URINALYSIS DIP (DEVICE)
Bilirubin Urine: NEGATIVE
Glucose, UA: NEGATIVE mg/dL
Hgb urine dipstick: NEGATIVE
Ketones, ur: NEGATIVE mg/dL
Nitrite: POSITIVE — AB
Protein, ur: NEGATIVE mg/dL
Specific Gravity, Urine: 1.025 (ref 1.005–1.030)
Urobilinogen, UA: 0.2 mg/dL (ref 0.0–1.0)
pH: 5.5 (ref 5.0–8.0)

## 2015-02-05 LAB — POCT PREGNANCY, URINE: Preg Test, Ur: POSITIVE — AB

## 2015-02-05 MED ORDER — CLOTRIMAZOLE 1 % VA CREA
1.0000 | TOPICAL_CREAM | Freq: Every day | VAGINAL | Status: DC
Start: 2015-02-05 — End: 2015-03-23

## 2015-02-05 NOTE — Discharge Instructions (Signed)
Monilial Vaginitis Vaginitis in a soreness, swelling and redness (inflammation) of the vagina and vulva. Monilial vaginitis is not a sexually transmitted infection. CAUSES  Yeast vaginitis is caused by yeast (candida) that is normally found in your vagina. With a yeast infection, the candida has overgrown in number to a point that upsets the chemical balance. SYMPTOMS   White, thick vaginal discharge.  Swelling, itching, redness and irritation of the vagina and possibly the lips of the vagina (vulva).  Burning or painful urination.  Painful intercourse. DIAGNOSIS  Things that may contribute to monilial vaginitis are:  Postmenopausal and virginal states.  Pregnancy.  Infections.  Being tired, sick or stressed, especially if you had monilial vaginitis in the past.  Diabetes. Good control will help lower the chance.  Birth control pills.  Tight fitting garments.  Using bubble bath, feminine sprays, douches or deodorant tampons.  Taking certain medications that kill germs (antibiotics).  Sporadic recurrence can occur if you become ill. TREATMENT  Your caregiver will give you medication.  There are several kinds of anti monilial vaginal creams and suppositories specific for monilial vaginitis. For recurrent yeast infections, use a suppository or cream in the vagina 2 times a week, or as directed.  Anti-monilial or steroid cream for the itching or irritation of the vulva may also be used. Get your caregiver's permission.  Painting the vagina with methylene blue solution may help if the monilial cream does not work.  Eating yogurt may help prevent monilial vaginitis. HOME CARE INSTRUCTIONS   Finish all medication as prescribed.  Do not have sex until treatment is completed or after your caregiver tells you it is okay.  Take warm sitz baths.  Do not douche.  Do not use tampons, especially scented ones.  Wear cotton underwear.  Avoid tight pants and panty  hose.  Tell your sexual partner that you have a yeast infection. They should go to their caregiver if they have symptoms such as mild rash or itching.  Your sexual partner should be treated as well if your infection is difficult to eliminate.  Practice safer sex. Use condoms.  Some vaginal medications cause latex condoms to fail. Vaginal medications that harm condoms are:  Cleocin cream.  Butoconazole (Femstat).  Terconazole (Terazol) vaginal suppository.  Miconazole (Monistat) (may be purchased over the counter). SEEK MEDICAL CARE IF:   You have a temperature by mouth above 102 F (38.9 C).  The infection is getting worse after 2 days of treatment.  The infection is not getting better after 3 days of treatment.  You develop blisters in or around your vagina.  You develop vaginal bleeding, and it is not your menstrual period.  You have pain when you urinate.  You develop intestinal problems.  You have pain with sexual intercourse.   This information is not intended to replace advice given to you by your health care provider. Make sure you discuss any questions you have with your health care provider.   Document Released: 11/28/2004 Document Revised: 05/13/2011 Document Reviewed: 08/22/2014 Elsevier Interactive Patient Education 2016 [employer]. First Trimester of Pregnancy The first trimester of pregnancy is from week 1 until the end of week 12 (months 1 through 3). During this time, your baby will begin to develop inside you. At 6-8 weeks, the eyes and face are formed, and the heartbeat can be seen on ultrasound. At the end of 12 weeks, all the baby's organs are formed. Prenatal care is all the medical care you receive before the  birth of your baby. Make sure you get good prenatal care and follow all of your doctor's instructions. HOME CARE  Medicines  Take medicine only as told by your doctor. Some medicines are safe and some are not during pregnancy.  Take your  prenatal vitamins as told by your doctor.  Take medicine that helps you poop (stool softener) as needed if your doctor says it is okay. Diet  Eat regular, healthy meals.  Your doctor will tell you the amount of weight gain that is right for you.  Avoid raw meat and uncooked cheese.  If you feel sick to your stomach (nauseous) or throw up (vomit):  Eat 4 or 5 small meals a day instead of 3 large meals.  Try eating a few soda crackers.  Drink liquids between meals instead of during meals.  If you have a hard time pooping (constipation):  Eat high-fiber foods like fresh vegetables, fruit, and whole grains.  Drink enough fluids to keep your pee (urine) clear or pale yellow. Activity and Exercise  Exercise only as told by your doctor. Stop exercising if you have cramps or pain in your lower belly (abdomen) or low back.  Try to avoid standing for long periods of time. Move your legs often if you must stand in one place for a long time.  Avoid heavy lifting.  Wear low-heeled shoes. Sit and stand up straight.  You can have sex unless your doctor tells you not to. Relief of Pain or Discomfort  Wear a good support bra if your breasts are sore.  Take warm water baths (sitz baths) to soothe pain or discomfort caused by hemorrhoids. Use hemorrhoid cream if your doctor says it is okay.  Rest with your legs raised if you have leg cramps or low back pain.  Wear support hose if you have puffy, bulging veins (varicose veins) in your legs. Raise (elevate) your feet for 15 minutes, 3-4 times a day. Limit salt in your diet. Prenatal Care  Schedule your prenatal visits by the twelfth week of pregnancy.  Write down your questions. Take them to your prenatal visits.  Keep all your prenatal visits as told by your doctor. Safety  Wear your seat belt at all times when driving.  Make a list of emergency phone numbers. The list should include numbers for family, friends, the hospital, and  police and fire departments. General Tips  Ask your doctor for a referral to a local prenatal class. Begin classes no later than at the start of month 6 of your pregnancy.  Ask for help if you need counseling or help with nutrition. Your doctor can give you advice or tell you where to go for help.  Do not use hot tubs, steam rooms, or saunas.  Do not douche or use tampons or scented sanitary pads.  Do not cross your legs for long periods of time.  Avoid litter boxes and soil used by cats.  Avoid all smoking, herbs, and alcohol. Avoid drugs not approved by your doctor.  Do not use any tobacco products, including cigarettes, chewing tobacco, and electronic cigarettes. If you need help quitting, ask your doctor. You may get counseling or other support to help you quit.  Visit your dentist. At home, brush your teeth with a soft toothbrush. Be gentle when you floss. GET HELP IF:  You are dizzy.  You have mild cramps or pressure in your lower belly.  You have a nagging pain in your belly area.  You continue  to feel sick to your stomach, throw up, or have watery poop (diarrhea).  You have a bad smelling fluid coming from your vagina.  You have pain with peeing (urination).  You have increased puffiness (swelling) in your face, hands, legs, or ankles. GET HELP RIGHT AWAY IF:   You have a fever.  You are leaking fluid from your vagina.  You have spotting or bleeding from your vagina.  You have very bad belly cramping or pain.  You gain or lose weight rapidly.  You throw up blood. It may look like coffee grounds.  You are around people who have [place] measles, fifth disease, or chickenpox.  You have a very bad headache.  You have shortness of breath.  You have any kind of trauma, such as from a fall or a car accident.   This information is not intended to replace advice given to you by your health care provider. Make sure you discuss any questions you have with your  health care provider.   Document Released: 08/07/2007 Document Revised: 03/11/2014 Document Reviewed: 12/29/2012 Elsevier Interactive Patient Education [employer] Elsevier Inc.

## 2015-02-05 NOTE — ED Notes (Signed)
Pt returns today with vaginal d/c with odor  Last seen here 10/6 for same sx's and prescribed Diflucan and Terazol supp States the medication only worked for a few days Denies itch, burn or vag swelling LMP- 11/20 c/o nausea with vomiting in the morning only

## 2015-02-05 NOTE — ED Provider Notes (Signed)
CSN: [id number]     Arrival date & time 02/05/15  1359 History   None    Chief Complaint  Patient presents with  . Vaginal Discharge   (Consider location/radiation/quality/duration/timing/severity/associated sxs/prior Treatment) HPI 19 year old female with history of vaginal discharge. No past medical history on file. No past surgical history on file. No family history on file. Social History  Substance Use Topics  . Smoking status: Passive Smoke Exposure - Never Smoker  . Smokeless tobacco: Not on file  . Alcohol Use: No   OB History    Gravida Para Term Preterm AB TAB SAB Ectopic Multiple Living   1              Review of Systems Positive for vaginal discharge Allergies  Review of patient's allergies indicates no known allergies.  Home Medications   Prior to Admission medications   Medication Sig Start Date End Date Taking? Authorizing Provider  fluconazole (DIFLUCAN) 150 MG tablet Take 1 tablet (150 mg total) by mouth once. Repeat in 1 wk if needed 12/08/14   Linna Hoff, MD  metroNIDAZOLE (FLAGYL) 500 MG tablet Take 1 tablet (500 mg total) by mouth 2 (two) times daily. 10/04/14   Marlis Edelson, CNM  terconazole (TERAZOL 3) 80 MG vaginal suppository Place 1 suppository (80 mg total) vaginally at bedtime. 12/08/14   Linna Hoff, MD   Meds Ordered and Administered this Visit  Medications - No data to display  There were no vitals taken for this visit. No data found.   Physical Exam  Constitutional: She is oriented to person, place, and time. She appears well-developed and well-nourished.  HENT:  Head: Atraumatic.  Pulmonary/Chest: Effort normal.  Genitourinary:  Vaginal examination performed with female chaperone there is a known of white curdish discharge noted from the vagina. Cervical os is closed. And nontender.  Musculoskeletal: Normal range of motion.  Neurological: She is alert and oriented to person, place, and time.  Skin: Skin is warm and dry.   Nursing note and vitals reviewed.   ED Course  Procedures (including critical care time)  Labs Review Labs Reviewed  POCT URINALYSIS DIP (DEVICE) - Abnormal; Notable for the following:    Nitrite POSITIVE (*)    Leukocytes, UA TRACE (*)    All other components within normal limits  POCT PREGNANCY, URINE - Abnormal; Notable for the following:    Preg Test, Ur POSITIVE (*)    All other components within normal limits  CERVICOVAGINAL ANCILLARY ONLY    Imaging Review No results found.   Visual Acuity Review  Right Eye Distance:   Left Eye Distance:   Bilateral Distance:    Right Eye Near:   Left Eye Near:    Bilateral Near:         MDM   1. Vaginal candidiasis   2. Pregnancy    Urinalysis is positive for nitrite and trace leukocyte. Patient is having no symptoms and urinary tract infection at this time. Treatment of UTI will be withheld until urine culture returns. Urine hCG is positive which was surprising to the patient. Due to the fact that she is asymptomatic vaginal candidiasis patient vaginal candidiasis will be treated with Chlortrimazole as suggested by up to date. Patient is also provided with the prenatal clinic referral number at [facility]. This is patient is about to leave she states that she is also concerned about the fact that her boyfriend is HIV positive and she is worried now although this may  affect her baby. She states that she is known that her boyfriend is HIV positive for a little over a couple months now she has been tested but is due for information test in January. I have advised her that she needs [facility] as [facility] as possible to get her referrals as needed or start medications as needed. Sensation states that she would prefer to go [facility] [place] I have advised patient that they did not have any referral numbers for [facility] and that she would have to obtain those on her on. Instructions of care  provided discharged home in stable condition.    Tharon AquasFrank C Jakolby Sedivy, PA 02/05/15 [phone]

## 2015-02-06 LAB — CERVICOVAGINAL ANCILLARY ONLY
Chlamydia: NEGATIVE
Neisseria Gonorrhea: NEGATIVE
Wet Prep (BD Affirm): POSITIVE — AB

## 2015-02-07 NOTE — ED Notes (Addendum)
Discussed positive gardnerella  finding in a pregnant patient w Dr Randal BubaErin Honig, who authorized Rx Flagyl 500 mg PO , BID, x 7 days , NR. Called patient , and after verifying ID discussed positive findings. Advised she will need to discuss w her partner, so they can be treated and she can avoid being reinfected. Patient verbalized understanding of plan. Called Rx to [facility] [address] , left detailed voice message on store answering machine

## 2015-02-21 ENCOUNTER — Encounter: Payer: Managed Care, Other (non HMO) | Admitting: Student

## 2015-02-23 ENCOUNTER — Encounter: Payer: Self-pay | Admitting: Certified Nurse Midwife

## 2015-02-23 ENCOUNTER — Ambulatory Visit (INDEPENDENT_AMBULATORY_CARE_PROVIDER_SITE_OTHER): Payer: Managed Care, Other (non HMO) | Admitting: Certified Nurse Midwife

## 2015-02-23 VITALS — BP 123/93 | HR 98 | Wt 148.8 lb

## 2015-02-23 DIAGNOSIS — N390 Urinary tract infection, site not specified: Secondary | ICD-10-CM | POA: Insufficient documentation

## 2015-02-23 DIAGNOSIS — Z206 Contact with and (suspected) exposure to human immunodeficiency virus [HIV]: Secondary | ICD-10-CM

## 2015-02-23 DIAGNOSIS — N898 Other specified noninflammatory disorders of vagina: Secondary | ICD-10-CM

## 2015-02-23 DIAGNOSIS — Z1151 Encounter for screening for human papillomavirus (HPV): Secondary | ICD-10-CM | POA: Diagnosis not present

## 2015-02-23 DIAGNOSIS — Z3481 Encounter for supervision of other normal pregnancy, first trimester: Secondary | ICD-10-CM

## 2015-02-23 DIAGNOSIS — Z124 Encounter for screening for malignant neoplasm of cervix: Secondary | ICD-10-CM

## 2015-02-23 DIAGNOSIS — Z3401 Encounter for supervision of normal first pregnancy, first trimester: Secondary | ICD-10-CM

## 2015-02-23 HISTORY — DX: Contact with and (suspected) exposure to human immunodeficiency virus (hiv): Z20.6

## 2015-02-23 HISTORY — DX: Urinary tract infection, site not specified: N39.0

## 2015-02-23 MED ORDER — METRONIDAZOLE 500 MG PO TABS: 500.0000 mg | ORAL_TABLET | Freq: Two times a day (BID) | ORAL | Status: DC

## 2015-02-23 MED ORDER — NITROFURANTOIN MONOHYD MACRO 100 MG PO CAPS: 100.0000 mg | ORAL_CAPSULE | Freq: Two times a day (BID) | ORAL | Status: DC

## 2015-02-23 NOTE — Addendum Note (Signed)
Addended by: Kathee DeltonHILLMAN, Yulianna Folse L on: 02/23/2015 04:55 PM   Modules accepted: Orders

## 2015-02-23 NOTE — Patient Instructions (Signed)
Safe Medications in Pregnancy   Acne: Benzoyl Peroxide Salicylic Acid  Backache/Headache: Tylenol: 2 regular strength every 4 hours OR              2 Extra strength every 6 hours  Colds/Coughs/Allergies: Benadryl (alcohol free) 25 mg every 6 hours as needed Breath right strips Claritin Cepacol throat lozenges Chloraseptic throat spray Cold-Eeze- up to three times per day Cough drops, alcohol free Flonase (by prescription only) Guaifenesin Mucinex Robitussin DM (plain only, alcohol free) Saline nasal spray/drops Sudafed (pseudoephedrine) & Actifed ** use only after [redacted] weeks gestation and if you do not have high blood pressure Tylenol Vicks Vaporub Zinc lozenges Zyrtec   Constipation: Colace Ducolax suppositories Fleet enema Glycerin suppositories Metamucil Milk of magnesia Miralax Senokot Smooth move tea  Diarrhea: Kaopectate Imodium A-D  *NO pepto Bismol  Hemorrhoids: Anusol Anusol HC Preparation H Tucks  Indigestion: Tums Maalox Mylanta Zantac  Pepcid  Insomnia: Benadryl (alcohol free) 25mg every 6 hours as needed Tylenol PM Unisom, no Gelcaps  Leg Cramps: Tums MagGel  Nausea/Vomiting:  Bonine Dramamine Emetrol Ginger extract Sea bands Meclizine  Nausea medication to take during pregnancy:  Unisom (doxylamine succinate 25 mg tablets) Take one tablet daily at bedtime. If symptoms are not adequately controlled, the dose can be increased to a maximum recommended dose of two tablets daily (1/2 tablet in the morning, 1/2 tablet mid-afternoon and one at bedtime). Vitamin B6 100mg tablets. Take one tablet twice a day (up to 200 mg per day).  Skin Rashes: Aveeno products Benadryl cream or 25mg every 6 hours as needed Calamine Lotion 1% cortisone cream  Yeast infection: Gyne-lotrimin 7 Monistat 7   **If taking multiple medications, please check labels to avoid duplicating the same active ingredients **take medication as directed on  the label ** Do not exceed 4000 mg of tylenol in 24 hours **Do not take medications that contain aspirin or ibuprofen       Second Trimester of Pregnancy The second trimester is from week 13 through week 28, months 4 through 6. The second trimester is often a time when you feel your best. Your body has also adjusted to being pregnant, and you begin to feel better physically. Usually, morning sickness has lessened or quit completely, you may have more energy, and you may have an increase in appetite. The second trimester is also a time when the fetus is growing rapidly. At the end of the sixth month, the fetus is about 9 inches long and weighs about 1 pounds. You will likely begin to feel the baby move (quickening) between 18 and 20 weeks of the pregnancy. BODY CHANGES Your body goes through many changes during pregnancy. The changes vary from woman to woman.   Your weight will continue to increase. You will notice your lower abdomen bulging out.  You may begin to get stretch marks on your hips, abdomen, and breasts.  You may develop headaches that can be relieved by medicines approved by your health care provider.  You may urinate more often because the fetus is pressing on your bladder.  You may develop or continue to have heartburn as a result of your pregnancy.  You may develop constipation because certain hormones are causing the muscles that push waste through your intestines to slow down.  You may develop hemorrhoids or swollen, bulging veins (varicose veins).  You may have back pain because of the weight gain and pregnancy hormones relaxing your joints between the bones in your pelvis and as   a result of a shift in weight and the muscles that support your balance.  Your breasts will continue to grow and be tender.  Your gums may bleed and may be sensitive to brushing and flossing.  Dark spots or blotches (chloasma, mask of pregnancy) may develop on your face. This will likely  fade after the baby is born.  A dark line from your belly button to the pubic area (linea nigra) may appear. This will likely fade after the baby is born.  You may have changes in your hair. These can include thickening of your hair, rapid growth, and changes in texture. Some women also have hair loss during or after pregnancy, or hair that feels dry or thin. Your hair will most likely return to normal after your baby is born. WHAT TO EXPECT AT YOUR PRENATAL VISITS During a routine prenatal visit:  You will be weighed to make sure you and the fetus are growing normally.  Your blood pressure will be taken.  Your abdomen will be measured to track your baby's growth.  The fetal heartbeat will be listened to.  Any test results from the previous visit will be discussed. Your health care provider may ask you:  How you are feeling.  If you are feeling the baby move.  If you have had any abnormal symptoms, such as leaking fluid, bleeding, severe headaches, or abdominal cramping.  If you are using any tobacco products, including cigarettes, chewing tobacco, and electronic cigarettes.  If you have any questions. Other tests that may be performed during your second trimester include:  Blood tests that check for:  Low iron levels (anemia).  Gestational diabetes (between 24 and 28 weeks).  Rh antibodies.  Urine tests to check for infections, diabetes, or protein in the urine.  An ultrasound to confirm the proper growth and development of the baby.  An amniocentesis to check for possible genetic problems.  Fetal screens for spina bifida and Down syndrome.  HIV (human immunodeficiency virus) testing. Routine prenatal testing includes screening for HIV, unless you choose not to have this test. HOME CARE INSTRUCTIONS   Avoid all smoking, herbs, alcohol, and unprescribed drugs. These chemicals affect the formation and growth of the baby.  Do not use any tobacco products, including  cigarettes, chewing tobacco, and electronic cigarettes. If you need help quitting, ask your health care provider. You may receive counseling support and other resources to help you quit.  Follow your health care provider's instructions regarding medicine use. There are medicines that are either safe or unsafe to take during pregnancy.  Exercise only as directed by your health care provider. Experiencing uterine cramps is a good sign to stop exercising.  Continue to eat regular, healthy meals.  Wear a good support bra for breast tenderness.  Do not use hot tubs, steam rooms, or saunas.  Wear your seat belt at all times when driving.  Avoid raw meat, uncooked cheese, cat litter boxes, and soil used by cats. These carry germs that can cause birth defects in the baby.  Take your prenatal vitamins.  Take 1500-2000 mg of calcium daily starting at the 20th week of pregnancy until you deliver your baby.  Try taking a stool softener (if your health care provider approves) if you develop constipation. Eat more high-fiber foods, such as fresh vegetables or fruit and whole grains. Drink plenty of fluids to keep your urine clear or pale yellow.  Take warm sitz baths to soothe any pain or discomfort caused by   hemorrhoids. Use hemorrhoid cream if your health care provider approves.  If you develop varicose veins, wear support hose. Elevate your feet for 15 minutes, 3-4 times a day. Limit salt in your diet.  Avoid heavy lifting, wear low heel shoes, and practice good posture.  Rest with your legs elevated if you have leg cramps or low back pain.  Visit your dentist if you have not gone yet during your pregnancy. Use a soft toothbrush to brush your teeth and be gentle when you floss.  A sexual relationship may be continued unless your health care provider directs you otherwise.  Continue to go to all your prenatal visits as directed by your health care provider. SEEK MEDICAL CARE IF:   You have  dizziness.  You have mild pelvic cramps, pelvic pressure, or nagging pain in the abdominal area.  You have persistent nausea, vomiting, or diarrhea.  You have a bad smelling vaginal discharge.  You have pain with urination. SEEK IMMEDIATE MEDICAL CARE IF:   You have a fever.  You are leaking fluid from your vagina.  You have spotting or bleeding from your vagina.  You have severe abdominal cramping or pain.  You have rapid weight gain or loss.  You have shortness of breath with chest pain.  You notice sudden or extreme swelling of your face, hands, ankles, feet, or legs.  You have not felt your baby move in over an hour.  You have severe headaches that do not go away with medicine.  You have vision changes.   This information is not intended to replace advice given to you by your health care provider. Make sure you discuss any questions you have with your health care provider.   Document Released: 02/12/2001 Document Revised: 03/11/2014 Document Reviewed: 04/21/2012 Elsevier Interactive Patient Education 2016 Elsevier Inc.  

## 2015-02-23 NOTE — Progress Notes (Signed)
   Subjective:    Madeline FlatteryDayjai Wilkins is a G3P0010 5268w1d being seen today for her first obstetrical visit.  Her obstetrical history is significant for exposure to HIV. Patient does intend to breast feed. Pregnancy history fully reviewed.  Patient reports no complaints.  Filed Vitals:   02/23/15 1318  BP: 123/93  Pulse: 98  Weight: 148 lb 12.8 oz (67.495 kg)    HISTORY: OB History  Gravida Para Term Preterm AB SAB TAB Ectopic Multiple Living  3    1         # Outcome Date GA Lbr Len/2nd Weight Sex Delivery Anes PTL Lv  3 Current           2 AB           1 Gravida              Comments: System Generated. Please review and update pregnancy details.     No past medical history on file. History reviewed. No pertinent past surgical history. History reviewed. No pertinent family history.   Exam    Uterus:     Pelvic Exam:    Perineum: No Hemorrhoids   Vulva: normal   Vagina:  curdlike discharge   pH:    Cervix: no bleeding following Pap   Adnexa:    Bony Pelvis: gynecoid  System: Breast:     Skin: normal coloration and turgor, no rashes    Neurologic: oriented, normal   Extremities: normal strength, tone, and muscle mass   HEENT    Mouth/Teeth mucous membranes moist, pharynx normal without lesions   Neck supple and no masses   Cardiovascular: regular rate and rhythm   Respiratory:  appears well, vitals normal, no respiratory distress, acyanotic, normal RR, ear and throat exam is normal, neck free of mass or lymphadenopathy, chest clear, no wheezing, crepitations, rhonchi, normal symmetric air entry   Abdomen: soft, non-tender; bowel sounds normal; no masses,  no organomegaly   Urinary: urethral meatus normal      Assessment:    Pregnancy: G3P0010 Patient Active Problem List   Diagnosis Date Noted  . Encounter for supervision of other normal pregnancy in first trimester 02/23/2015  . 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 Urinary tract infection 02/23/2015        Plan:      Initial labs drawn. Prenatal vitamins. Problem list reviewed and updated. Genetic Screening discussed Quad Screen: ordered.  Ultrasound discussed; fetal survey: requested.ordered Pt will need HIV testing every 3 months  Follow up in 4 weeks. 50% of 30 min visit spent on counseling and coordination of care.     Riven Beebe Grissett 02/23/2015

## 2015-02-23 NOTE — Addendum Note (Signed)
Addended by: Garret ReddishBARNES, Annis Lagoy M on: 02/23/2015 04:35 PM   Modules accepted: Orders

## 2015-02-24 LAB — PRENATAL PROFILE (SOLSTAS)
Antibody Screen: NEGATIVE
Basophils Absolute: 0 10*3/uL (ref 0.0–0.1)
Basophils Relative: 0 % (ref 0–1)
Eosinophils Absolute: 0 10*3/uL (ref 0.0–0.7)
Eosinophils Relative: 0 % (ref 0–5)
HCT: 35.1 % — ABNORMAL LOW (ref 36.0–46.0)
HIV 1&2 Ab, 4th Generation: NONREACTIVE
Hemoglobin: 11.7 g/dL — ABNORMAL LOW (ref 12.0–15.0)
Hepatitis B Surface Ag: NEGATIVE
Lymphocytes Relative: 23 % (ref 12–46)
Lymphs Abs: 1.6 10*3/uL (ref 0.7–4.0)
MCH: 30.9 pg (ref 26.0–34.0)
MCHC: 33.3 g/dL (ref 30.0–36.0)
MCV: 92.6 fL (ref 78.0–100.0)
MPV: 8.3 fL — ABNORMAL LOW (ref 8.6–12.4)
Monocytes Absolute: 0.6 10*3/uL (ref 0.1–1.0)
Monocytes Relative: 8 % (ref 3–12)
Neutro Abs: 4.9 10*3/uL (ref 1.7–7.7)
Neutrophils Relative %: 69 % (ref 43–77)
Platelets: 251 10*3/uL (ref 150–400)
RBC: 3.79 MIL/uL — ABNORMAL LOW (ref 3.87–5.11)
RDW: 13.4 % (ref 11.5–15.5)
Rh Type: POSITIVE
Rubella: 3.13 Index — ABNORMAL HIGH (ref <0.90)
WBC: 7.1 10*3/uL (ref 4.0–10.5)

## 2015-02-24 LAB — WET PREP, GENITAL
Trich, Wet Prep: NONE SEEN
WBC, Wet Prep HPF POC: NONE SEEN
Yeast Wet Prep HPF POC: NONE SEEN

## 2015-02-26 LAB — CULTURE, OB URINE: Colony Count: >=100000

## 2015-02-27 LAB — HEMOGLOBINOPATHY EVALUATION
Hemoglobin Other: 0 %
Hgb A2 Quant: 2.9 % (ref 2.2–3.2)
Hgb A: 97.1 % (ref 96.8–97.8)
Hgb F Quant: 0 % (ref 0.0–2.0)
Hgb S Quant: 0 %

## 2015-03-01 LAB — PRESCRIPTION MONITORING PROFILE (19 PANEL)
Amphetamine/Meth: NEGATIVE ng/mL
Barbiturate Screen, Urine: NEGATIVE ng/mL
Benzodiazepine Screen, Urine: NEGATIVE ng/mL
Buprenorphine, Urine: NEGATIVE ng/mL
Cannabinoid Scrn, Ur: NEGATIVE ng/mL
Carisoprodol, Urine: NEGATIVE ng/mL
Cocaine Metabolites: NEGATIVE ng/mL
Creatinine, Urine: 184.25 mg/dL (ref >20.0)
Fentanyl, Ur: NEGATIVE ng/mL
MDMA URINE: NEGATIVE ng/mL
Meperidine, Ur: NEGATIVE ng/mL
Methadone Screen, Urine: NEGATIVE ng/mL
Methaqualone: NEGATIVE ng/mL
Nitrites, Initial: NEGATIVE ug/mL
Opiate Screen, Urine: NEGATIVE ng/mL
Oxycodone Screen, Ur: NEGATIVE ng/mL
Phencyclidine, Ur: NEGATIVE ng/mL
Propoxyphene: NEGATIVE ng/mL
Tapentadol, urine: NEGATIVE ng/mL
Tramadol Scrn, Ur: NEGATIVE ng/mL
Zolpidem, Urine: NEGATIVE ng/mL
pH, Initial: 6.1 pH (ref 4.5–8.9)

## 2015-03-02 LAB — CYTOLOGY - PAP

## 2015-03-05 NOTE — L&D Delivery Note (Signed)
Delivery Note At 3:43 PM a viable female was delivered via Vaginal, Spontaneous Delivery (Presentation: ; Occiput Anterior).  APGAR: 9, 9; weight 7 lb 12.5 oz (3530 g).   Placenta status: Intact, Spontaneous.  Cord: 3 vessels with the following complications: None.  Cord pH: n/a  Anesthesia: Epidural  Episiotomy: None Lacerations: Perineal Suture Repair: 2.0 vicryl Est. Blood Loss (mL): 200  Mom to postpartum.  Baby to Couplet care / Skin to Skin.  Madeline Wilkins 09/17/2015, 4:05 PM

## 2015-03-23 ENCOUNTER — Encounter: Payer: Self-pay | Admitting: Physician Assistant

## 2015-03-23 ENCOUNTER — Ambulatory Visit (INDEPENDENT_AMBULATORY_CARE_PROVIDER_SITE_OTHER): Payer: Managed Care, Other (non HMO) | Admitting: Physician Assistant

## 2015-03-23 VITALS — BP 111/67 | HR 78 | Wt 154.6 lb

## 2015-03-23 DIAGNOSIS — Z3481 Encounter for supervision of other normal pregnancy, first trimester: Secondary | ICD-10-CM

## 2015-03-23 LAB — POCT URINALYSIS DIP (DEVICE)
Bilirubin Urine: NEGATIVE
Glucose, UA: NEGATIVE mg/dL
Ketones, ur: NEGATIVE mg/dL
Nitrite: POSITIVE — AB
Protein, ur: NEGATIVE mg/dL
Specific Gravity, Urine: 1.025 (ref 1.005–1.030)
Urobilinogen, UA: 0.2 mg/dL (ref 0.0–1.0)
pH: 6 (ref 5.0–8.0)

## 2015-03-23 MED ORDER — PRENATAL VITAMINS PLUS 27-1 MG PO TABS: 1.0000 | ORAL_TABLET | Freq: Every day | ORAL | Status: DC

## 2015-03-23 NOTE — Progress Notes (Signed)
Pt did not pick up Macrobid last month - did not know it was sent to pharmacy.

## 2015-03-23 NOTE — Patient Instructions (Signed)

## 2015-03-23 NOTE — Progress Notes (Signed)
U/S scheduled for 04/07/2015 @ 8:00AM

## 2015-03-28 NOTE — Progress Notes (Signed)
Subjective:  Madeline Wilkins is a 20 y.o. G3P0010 at [redacted]w[redacted]d being seen today for ongoing prenatal care.  Patient reports no complaints.  Contractions: Not present.  Vag. Bleeding: None. Movement: Present. Denies leaking of fluid.   The following portions of the patient's history were reviewed and updated as appropriate: allergies, current medications, past family history, past medical history, past social history, past surgical history and problem list.   Objective:   Filed Vitals:   03/23/15 1048  BP: 111/67  Pulse: 78  Weight: 154 lb 9.6 oz (70.126 kg)    Fetal Status:     Movement: Present     General:  Alert, oriented and cooperative. Patient is in no acute distress.  Skin: Skin is warm and dry. No rash noted.   Cardiovascular: Normal heart rate noted  Respiratory: Normal respiratory effort, no problems with respiration noted  Abdomen: Soft, gravid, appropriate for gestational age. Pain/Pressure: Absent     Pelvic: Vag. Bleeding: None Vag D/C Character: White   Cervical exam deferred        Extremities: Normal range of motion.  Edema: None  Mental Status: Normal mood and affect. Normal behavior. Normal judgment and thought content.   Urinalysis:      Assessment and Plan:  Pregnancy: G3P0010 at [redacted]w[redacted]d  1. Encounter for supervision of other normal pregnancy in first trimester  2. Exposure to HIV. Pt had negative test 12/16.  Will need again 03/17.    Preterm labor symptoms and general obstetric precautions including but not limited to vaginal bleeding, contractions, leaking of fluid and fetal movement were reviewed in detail with the patient. Please refer to After Visit Summary for other counseling recommendations.  Return in about 4 weeks (around 04/20/2015).   Bertram Denver, PA-C

## 2015-04-06 ENCOUNTER — Other Ambulatory Visit: Payer: Self-pay | Admitting: Certified Nurse Midwife

## 2015-04-06 DIAGNOSIS — Z206 Contact with and (suspected) exposure to human immunodeficiency virus [HIV]: Secondary | ICD-10-CM

## 2015-04-06 DIAGNOSIS — Z3689 Encounter for other specified antenatal screening: Secondary | ICD-10-CM

## 2015-04-06 DIAGNOSIS — Z3A19 19 weeks gestation of pregnancy: Secondary | ICD-10-CM

## 2015-04-07 ENCOUNTER — Encounter (HOSPITAL_COMMUNITY): Payer: Self-pay

## 2015-04-07 ENCOUNTER — Other Ambulatory Visit: Payer: Self-pay | Admitting: Certified Nurse Midwife

## 2015-04-07 ENCOUNTER — Ambulatory Visit (HOSPITAL_COMMUNITY)
Admission: RE | Admit: 2015-04-07 | Discharge: 2015-04-07 | Disposition: A | Discharge status: Home / Self Care | Payer: Managed Care, Other (non HMO) | Source: Ambulatory Visit | Attending: Certified Nurse Midwife | Admitting: Certified Nurse Midwife

## 2015-04-07 DIAGNOSIS — Z206 Contact with and (suspected) exposure to human immunodeficiency virus [HIV]: Secondary | ICD-10-CM

## 2015-04-07 DIAGNOSIS — Z3A18 18 weeks gestation of pregnancy: Secondary | ICD-10-CM | POA: Diagnosis not present

## 2015-04-07 DIAGNOSIS — Z36 Encounter for antenatal screening of mother: Secondary | ICD-10-CM | POA: Insufficient documentation

## 2015-04-07 DIAGNOSIS — Z3689 Encounter for other specified antenatal screening: Secondary | ICD-10-CM

## 2015-04-07 DIAGNOSIS — Z3A19 19 weeks gestation of pregnancy: Secondary | ICD-10-CM

## 2015-04-19 ENCOUNTER — Ambulatory Visit (INDEPENDENT_AMBULATORY_CARE_PROVIDER_SITE_OTHER): Payer: Managed Care, Other (non HMO) | Admitting: Family

## 2015-04-19 VITALS — BP 93/70 | HR 87 | Temp 98.2°F | Wt 158.2 lb

## 2015-04-19 DIAGNOSIS — Z202 Contact with and (suspected) exposure to infections with a predominantly sexual mode of transmission: Secondary | ICD-10-CM

## 2015-04-19 DIAGNOSIS — Z206 Contact with and (suspected) exposure to human immunodeficiency virus [HIV]: Secondary | ICD-10-CM

## 2015-04-19 DIAGNOSIS — O2342 Unspecified infection of urinary tract in pregnancy, second trimester: Secondary | ICD-10-CM

## 2015-04-19 DIAGNOSIS — Z3481 Encounter for supervision of other normal pregnancy, first trimester: Secondary | ICD-10-CM

## 2015-04-19 LAB — POCT URINALYSIS DIP (DEVICE)
Bilirubin Urine: NEGATIVE
Glucose, UA: NEGATIVE mg/dL
Ketones, ur: NEGATIVE mg/dL
Nitrite: NEGATIVE
Protein, ur: NEGATIVE mg/dL
Specific Gravity, Urine: 1.025 (ref 1.005–1.030)
Urobilinogen, UA: 0.2 mg/dL (ref 0.0–1.0)
pH: 5.5 (ref 5.0–8.0)

## 2015-04-19 NOTE — Progress Notes (Signed)
Subjective:  Madeline Wilkins is a 20 y.o. G3P0010 at [redacted]w[redacted]d being seen today for ongoing prenatal care.  She is currently monitored for the following issues for this low-risk pregnancy and has Encounter for supervision of other normal pregnancy in first trimester; HIV exposure; and Urinary tract infection on her problem list.  Patient reports concern due to not feeling the baby moved throughout the day.  Reports feeling movement every other day.  Pt taking medication for UTI and what she thought was trich.  However, being treated for bacterial vaginosis for few clue.  Pt desires to take only one medication.   No report of abnormal vaginal discharge.   Contractions: Not present. Vag. Bleeding: None.  Movement: Present. Denies leaking of fluid.   The following portions of the patient's history were reviewed and updated as appropriate: allergies, current medications, past family history, past medical history, past social history, past surgical history and problem list. Problem list updated.  Objective:   Filed Vitals:   04/19/15 0950  BP: 93/70  Pulse: 87  Temp: 98.2 F (36.8 C)  Weight: 158 lb 3.2 oz (71.759 kg)    Fetal Status: Fetal Heart Rate (bpm): 154 Fundal Height: 20 cm Movement: Present     General:  Alert, oriented and cooperative. Patient is in no acute distress.  Skin: Skin is warm and dry. No rash noted.   Cardiovascular: Normal heart rate noted  Respiratory: Normal respiratory effort, no problems with respiration noted  Abdomen: Soft, gravid, appropriate for gestational age. Pain/Pressure: Absent     Pelvic: Vag. Bleeding: None     Cervical exam deferred        Extremities: Normal range of motion.  Edema: None  Mental Status: Normal mood and affect. Normal behavior. Normal judgment and thought content.   Urinalysis:     Protein neg  Glucose neg   Assessment and Plan:  Pregnancy: G3P0010 at [redacted]w[redacted]d  1. HIV exposure - Exposure in Summer, pt desires to get screen until Summer;  Explained typically if negative 6 months out there is a low chance of testing positive in future if not reexposed.    2. Encounter for supervision of other normal pregnancy in first trimester - Reviewed ultrasound results - Discussed the normalcy in feeling intermittent fetal movement at this point in pregnancy  3.  UTI - Continue Macrobid; may discontinue Flagyl to help with compliance with taking Macrobid.  General obstetric precautions including but not limited to vaginal bleeding and pelvic pain reviewed in detail with the patient. Return in about 4 weeks (around 05/17/2015).   Eino Farber Kennith Gain, CNM

## 2015-04-19 NOTE — Progress Notes (Signed)
Pt states she is having decrease movement. She did not complete her treatment for Trich. She would like to be recheck today.

## 2015-04-25 ENCOUNTER — Telehealth: Payer: Self-pay | Admitting: General Practice

## 2015-04-25 NOTE — Telephone Encounter (Signed)
Contacted pt and pt asked if she could get a Hepatitis A vaccine.  I advised pt to not get the vaccine during pregnancy and to wait until she has delivered in which she will need to contact her PCP for the injection because we do not administer the vaccine here at this office.  Pt stated understanding with no further questions.

## 2015-04-25 NOTE — Telephone Encounter (Signed)
Patient called and left message stating she is calling to see if she can get her Hepatitis a #2 shot. Patient states the [facility] at work office on [address] wants to know.

## 2015-05-17 ENCOUNTER — Encounter: Payer: Managed Care, Other (non HMO) | Admitting: Family

## 2015-05-24 ENCOUNTER — Ambulatory Visit (INDEPENDENT_AMBULATORY_CARE_PROVIDER_SITE_OTHER): Payer: Managed Care, Other (non HMO) | Admitting: Family Medicine

## 2015-05-24 ENCOUNTER — Encounter: Payer: Self-pay | Admitting: Family Medicine

## 2015-05-24 VITALS — BP 118/82 | HR 73 | Temp 98.4°F | Wt 166.7 lb

## 2015-05-24 DIAGNOSIS — Z3481 Encounter for supervision of other normal pregnancy, first trimester: Secondary | ICD-10-CM

## 2015-05-24 DIAGNOSIS — Z206 Contact with and (suspected) exposure to human immunodeficiency virus [HIV]: Secondary | ICD-10-CM

## 2015-05-24 LAB — POCT URINALYSIS DIP (DEVICE)
Bilirubin Urine: NEGATIVE
Glucose, UA: NEGATIVE mg/dL
Hgb urine dipstick: NEGATIVE
Ketones, ur: NEGATIVE mg/dL
Leukocytes, UA: NEGATIVE
Nitrite: NEGATIVE
Protein, ur: NEGATIVE mg/dL
Specific Gravity, Urine: 1.025 (ref 1.005–1.030)
Urobilinogen, UA: 0.2 mg/dL (ref 0.0–1.0)
pH: 6.5 (ref 5.0–8.0)

## 2015-05-24 LAB — HIV ANTIBODY (ROUTINE TESTING W REFLEX): HIV 1&2 Ab, 4th Generation: NONREACTIVE

## 2015-05-24 NOTE — Patient Instructions (Signed)

## 2015-05-24 NOTE — Progress Notes (Signed)
Breastfeeding tip of the week reviewed. 

## 2015-05-24 NOTE — Progress Notes (Signed)
Subjective:  Madeline Wilkins is a 20 y.o. G3P0010 at [redacted]w[redacted]d being seen today for ongoing prenatal care.  She is currently monitored for the following issues for this low-risk pregnancy and has Encounter for supervision of other normal pregnancy in first trimester; HIV exposure; and Urinary tract infection on her problem list.  Patient reports no complaints.  Contractions: Not present. Vag. Bleeding: None.  Movement: Present. Denies leaking of fluid.   The following portions of the patient's history were reviewed and updated as appropriate: allergies, current medications, past family history, past medical history, past social history, past surgical history and problem list. Problem list updated.  Objective:   Filed Vitals:   05/24/15 1041  BP: 118/82  Pulse: 73  Temp: 98.4 F (36.9 C)  Weight: 166 lb 11.2 oz (75.615 kg)    Fetal Status: Fetal Heart Rate (bpm): 145 Fundal Height: 24 cm Movement: Present     General:  Alert, oriented and cooperative. Patient is in no acute distress.  Skin: Skin is warm and dry. No rash noted.   Cardiovascular: Normal heart rate noted  Respiratory: Normal respiratory effort, no problems with respiration noted  Abdomen: Soft, gravid, appropriate for gestational age. Pain/Pressure: Absent     Pelvic: Vag. Bleeding: None Vag D/C Character: White   Cervical exam deferred        Extremities: Normal range of motion.  Edema: None  Mental Status: Normal mood and affect. Normal behavior. Normal judgment and thought content.   Urinalysis:      Assessment and Plan:  Pregnancy: G3P0010 at 752w5d  1. Encounter for supervision of other normal pregnancy in first trimester -updated box - AFP today  2. HIV exposure - exposed in summer 2016, Dec testing neg - HIV antibody (with reflex)  Preterm labor symptoms and general obstetric precautions including but not limited to vaginal bleeding, contractions, leaking of fluid and fetal movement were reviewed in detail with  the patient. Please refer to After Visit Summary for other counseling recommendations.  Return in about 4 weeks (around 06/21/2015) for Routine prenatal care.   Federico FlakeKimberly Niles Aum Caggiano, MD

## 2015-05-30 LAB — ALPHA FETOPROTEIN, MATERNAL

## 2015-06-21 ENCOUNTER — Ambulatory Visit (INDEPENDENT_AMBULATORY_CARE_PROVIDER_SITE_OTHER): Payer: Managed Care, Other (non HMO) | Admitting: Advanced Practice Midwife

## 2015-06-21 ENCOUNTER — Encounter: Payer: Self-pay | Admitting: Advanced Practice Midwife

## 2015-06-21 VITALS — BP 118/67 | HR 82 | Wt 170.5 lb

## 2015-06-21 DIAGNOSIS — Z23 Encounter for immunization: Secondary | ICD-10-CM | POA: Diagnosis not present

## 2015-06-21 DIAGNOSIS — Z3481 Encounter for supervision of other normal pregnancy, first trimester: Secondary | ICD-10-CM

## 2015-06-21 DIAGNOSIS — Z0489 Encounter for examination and observation for other specified reasons: Secondary | ICD-10-CM

## 2015-06-21 DIAGNOSIS — Z1389 Encounter for screening for other disorder: Secondary | ICD-10-CM

## 2015-06-21 DIAGNOSIS — IMO0002 Reserved for concepts with insufficient information to code with codable children: Secondary | ICD-10-CM

## 2015-06-21 DIAGNOSIS — Z3483 Encounter for supervision of other normal pregnancy, third trimester: Secondary | ICD-10-CM

## 2015-06-21 DIAGNOSIS — Z206 Contact with and (suspected) exposure to human immunodeficiency virus [HIV]: Secondary | ICD-10-CM

## 2015-06-21 LAB — POCT URINALYSIS DIP (DEVICE)
Bilirubin Urine: NEGATIVE
Glucose, UA: NEGATIVE mg/dL
Ketones, ur: NEGATIVE mg/dL
Nitrite: NEGATIVE
Protein, ur: NEGATIVE mg/dL
Specific Gravity, Urine: >=1.03 (ref 1.005–1.030)
Urobilinogen, UA: 0.2 mg/dL (ref 0.0–1.0)
pH: 5.5 (ref 5.0–8.0)

## 2015-06-21 LAB — CBC
HCT: 34 % — ABNORMAL LOW (ref 35.0–45.0)
Hemoglobin: 11.1 g/dL — ABNORMAL LOW (ref 11.7–15.5)
MCH: 30.9 pg (ref 27.0–33.0)
MCHC: 32.6 g/dL (ref 32.0–36.0)
MCV: 94.7 fL (ref 80.0–100.0)
MPV: 8.8 fL (ref 7.5–12.5)
Platelets: 238 10*3/uL (ref 140–400)
RBC: 3.59 MIL/uL — ABNORMAL LOW (ref 3.80–5.10)
RDW: 13.9 % (ref 11.0–15.0)
WBC: 6 10*3/uL (ref 3.8–10.8)

## 2015-06-21 MED ORDER — TETANUS-DIPHTH-ACELL PERTUSSIS 5-2.5-18.5 LF-MCG/0.5 IM SUSP
0.5000 mL | Freq: Once | INTRAMUSCULAR | Status: AC
  Administered 2015-06-21: 0.5 mL via INTRAMUSCULAR

## 2015-06-21 NOTE — Patient Instructions (Signed)
Tdap Vaccine (Tetanus, Diphtheria and Pertussis): What You Need to Know 1. Why get vaccinated? Tetanus, diphtheria and pertussis are very serious diseases. Tdap vaccine can protect us from these diseases. And, Tdap vaccine given to pregnant women can protect newborn babies against pertussis. TETANUS (Lockjaw) is rare in the [place] today. It causes painful muscle tightening and stiffness, usually all over the body.  It can lead to tightening of muscles in the head and neck so you can't open your mouth, swallow, or sometimes even breathe. Tetanus kills about 1 out of 10 people who are infected even after receiving the best medical care. DIPHTHERIA is also rare in the [place] today. It can cause a thick coating to form in the back of the throat.  It can lead to breathing problems, heart failure, paralysis, and death. PERTUSSIS (Whooping Cough) causes severe coughing spells, which can cause difficulty breathing, vomiting and disturbed sleep.  It can also lead to weight loss, incontinence, and rib fractures. Up to 2 in 100 adolescents and 5 in 100 adults with pertussis are hospitalized or have complications, which could include pneumonia or death. These diseases are caused by bacteria. Diphtheria and pertussis are spread from person to person through secretions from coughing or sneezing. Tetanus enters the body through cuts, scratches, or wounds. Before vaccines, as many as 200,000 cases of diphtheria, 200,000 cases of pertussis, and hundreds of cases of tetanus, were reported in the [place] each year. Since vaccination began, reports of cases for tetanus and diphtheria have dropped by about 99% and for pertussis by about 80%. 2. Tdap vaccine Tdap vaccine can protect adolescents and adults from tetanus, diphtheria, and pertussis. One dose of Tdap is routinely given at age 11 or 12. People who did not get Tdap at that age should get it as soon as possible. Tdap is especially important  for healthcare professionals and anyone having close contact with a baby younger than 12 months. Pregnant women should get a dose of Tdap during every pregnancy, to protect the newborn from pertussis. Infants are most at risk for severe, life-threatening complications from pertussis. Another vaccine, called Td, protects against tetanus and diphtheria, but not pertussis. A Td booster should be given every 10 years. Tdap may be given as one of these boosters if you have never gotten Tdap before. Tdap may also be given after a severe cut or burn to prevent tetanus infection. Your doctor or the person giving you the vaccine can give you more information. Tdap may safely be given at the same time as other vaccines. 3. Some people should not get this vaccine  A person who has ever had a life-threatening allergic reaction after a previous dose of any diphtheria, tetanus or pertussis containing vaccine, OR has a severe allergy to any part of this vaccine, should not get Tdap vaccine. Tell the person giving the vaccine about any severe allergies.  Anyone who had coma or long repeated seizures within 7 days after a childhood dose of DTP or DTaP, or a previous dose of Tdap, should not get Tdap, unless a cause other than the vaccine was found. They can still get Td.  Talk to your doctor if you:  have seizures or another nervous system problem,  had severe pain or swelling after any vaccine containing diphtheria, tetanus or pertussis,  ever had a condition called Guillain-Barr Syndrome (GBS),  aren't feeling well on the day the shot is scheduled. 4. Risks With any medicine, including vaccines, there is   a chance of side effects. These are usually mild and go away on their own. Serious reactions are also possible but are rare. Most people who get Tdap vaccine do not have any problems with it. Mild problems following Tdap (Did not interfere with activities)  Pain where the shot was given (about 3 in 4  adolescents or 2 in 3 adults)  Redness or swelling where the shot was given (about 1 person in 5)  Mild fever of at least 100.4F (up to about 1 in 25 adolescents or 1 in 100 adults)  Headache (about 3 or 4 people in 10)  Tiredness (about 1 person in 3 or 4)  Nausea, vomiting, diarrhea, stomach ache (up to 1 in 4 adolescents or 1 in 10 adults)  Chills, sore joints (about 1 person in 10)  Body aches (about 1 person in 3 or 4)  Rash, swollen glands (uncommon) Moderate problems following Tdap (Interfered with activities, but did not require medical attention)  Pain where the shot was given (up to 1 in 5 or 6)  Redness or swelling where the shot was given (up to about 1 in 16 adolescents or 1 in 12 adults)  Fever over 102F (about 1 in 100 adolescents or 1 in 250 adults)  Headache (about 1 in 7 adolescents or 1 in 10 adults)  Nausea, vomiting, diarrhea, stomach ache (up to 1 or 3 people in 100)  Swelling of the entire arm where the shot was given (up to about 1 in 500). Severe problems following Tdap (Unable to perform usual activities; required medical attention)  Swelling, severe pain, bleeding and redness in the arm where the shot was given (rare). Problems that could happen after any vaccine:  People sometimes faint after a medical procedure, including vaccination. Sitting or lying down for about 15 minutes can help prevent fainting, and injuries caused by a fall. Tell your doctor if you feel dizzy, or have vision changes or ringing in the ears.  Some people get severe pain in the shoulder and have difficulty moving the arm where a shot was given. This happens very rarely.  Any medication can cause a severe allergic reaction. Such reactions from a vaccine are very rare, estimated at fewer than 1 in a million doses, and would happen within a few minutes to a few hours after the vaccination. As with any medicine, there is a very remote chance of a vaccine causing a serious  injury or death. The safety of vaccines is always being monitored. For more information, visit: www.cdc.gov/vaccinesafety/ 5. What if there is a serious problem? What should I look for?  Look for anything that concerns you, such as signs of a severe allergic reaction, very high fever, or unusual behavior.  Signs of a severe allergic reaction can include hives, swelling of the face and throat, difficulty breathing, a fast heartbeat, dizziness, and weakness. These would usually start a few minutes to a few hours after the vaccination. What should I do?  If you think it is a severe allergic reaction or other emergency that can't wait, call 9-1-1 or get the person to the nearest hospital. Otherwise, call your doctor.  Afterward, the reaction should be reported to the Vaccine Adverse Event Reporting System (VAERS). Your doctor might file this report, or you can do it yourself through the VAERS web site at www.vaers.hhs.gov, or by calling 1-800-822-7967. VAERS does not give medical advice.  6. The National Vaccine Injury Compensation Program The National Vaccine Injury Compensation Program (  VICP) is a federal program that was created to compensate people who may have been injured by certain vaccines. Persons who believe they may have been injured by a vaccine can learn about the program and about filing a claim by calling 1-800-338-2382 or visiting the VICP website at www.hrsa.gov/vaccinecompensation. There is a time limit to file a claim for compensation. 7. How can I learn more?  Ask your doctor. He or she can give you the vaccine package insert or suggest other sources of information.  Call your local or state health department.  Contact the Centers for Disease Control and Prevention (CDC):  Call 1-800-232-4636 (1-800-CDC-INFO) or  Visit CDC's website at www.cdc.gov/vaccines CDC Tdap Vaccine VIS (04/27/13)   This information is not intended to replace advice given to you by your health care  provider. Make sure you discuss any questions you have with your health care provider.   Document Released: 08/20/2011 Document Revised: 03/11/2014 Document Reviewed: 06/02/2013 Elsevier Interactive Patient Education 2016 Elsevier Inc.  

## 2015-06-21 NOTE — Progress Notes (Signed)
1hr due at 1125

## 2015-06-21 NOTE — Progress Notes (Signed)
Discussed BF with patient

## 2015-06-22 LAB — GLUCOSE TOLERANCE, 1 HOUR (50G) W/O FASTING: Glucose, 1 Hr, gestational: 85 mg/dL (ref <140)

## 2015-06-23 ENCOUNTER — Ambulatory Visit (HOSPITAL_COMMUNITY): Payer: Managed Care, Other (non HMO)

## 2015-06-23 LAB — RPR

## 2015-06-26 NOTE — Progress Notes (Signed)
Subjective:  Madeline Wilkins is a 20 y.o. G2P0010 at 3758w5d being seen today for ongoing prenatal care.  She is currently monitored for the following issues for this low-risk pregnancy and has Encounter for supervision of other normal pregnancy in first trimester; HIV exposure; and Urinary tract infection on her problem list.  Patient reports no complaints.  Contractions: Not present. Vag. Bleeding: None.  Movement: Present. Denies leaking of fluid.   The following portions of the patient's history were reviewed and updated as appropriate: allergies, current medications, past family history, past medical history, past social history, past surgical history and problem list. Problem list updated.  Objective:   Filed Vitals:   06/21/15 1019  BP: 118/67  Pulse: 82  Weight: 170 lb 8 oz (77.338 kg)    Fetal Status:   Fundal Height: 29 cm Movement: Present     General:  Alert, oriented and cooperative. Patient is in no acute distress.  Skin: Skin is warm and dry. No rash noted.   Cardiovascular: Normal heart rate noted  Respiratory: Normal respiratory effort, no problems with respiration noted  Abdomen: Soft, gravid, appropriate for gestational age. Pain/Pressure: Absent     Pelvic: Vag. Bleeding: None Vag D/C Character: White   Cervical exam deferred        Extremities: Normal range of motion.  Edema: None  Mental Status: Normal mood and affect. Normal behavior. Normal judgment and thought content.   Urinalysis:    tr Hgb and Leuks  Assessment and Plan:  Pregnancy: G2P0010 at 2552w3d  1. Prenatal care, subsequent pregnancy, third trimester  - Glucose Tolerance, 1 HR (50g) w/o Fasting - CBC - RPR - Tdap (BOOSTRIX) injection 0.5 mL; Inject 0.5 mLs into the muscle once.  2. HIV exposure - HIV neg 05/2015 - Recheck June 2017  3. Encounter for supervision of other normal pregnancy in first trimester   4. Evaluate anatomy not seen on prior sonogram  - US MFM OB FOLLOW UP; Future  5.  Encounter for routine screening for malformation using ultrasonics  - US MFM OB FOLLOW UP; Future  Preterm labor symptoms and general obstetric precautions including but not limited to vaginal bleeding, contractions, leaking of fluid and fetal movement were reviewed in detail with the patient. Please refer to After Visit Summary for other counseling recommendations.  Return in about 2 weeks (around 07/05/2015).   Madeline Wilkins, CNM

## 2015-06-28 ENCOUNTER — Ambulatory Visit (HOSPITAL_COMMUNITY)
Admission: RE | Admit: 2015-06-28 | Discharge: 2015-06-28 | Disposition: A | Discharge status: Home / Self Care | Payer: Managed Care, Other (non HMO) | Source: Ambulatory Visit | Attending: Advanced Practice Midwife | Admitting: Advanced Practice Midwife

## 2015-06-28 DIAGNOSIS — Z0489 Encounter for examination and observation for other specified reasons: Secondary | ICD-10-CM

## 2015-06-28 DIAGNOSIS — Z1389 Encounter for screening for other disorder: Secondary | ICD-10-CM

## 2015-06-28 DIAGNOSIS — Z3A29 29 weeks gestation of pregnancy: Secondary | ICD-10-CM | POA: Insufficient documentation

## 2015-06-28 DIAGNOSIS — Z36 Encounter for antenatal screening of mother: Secondary | ICD-10-CM | POA: Insufficient documentation

## 2015-06-28 DIAGNOSIS — IMO0002 Reserved for concepts with insufficient information to code with codable children: Secondary | ICD-10-CM

## 2015-06-28 DIAGNOSIS — Z3481 Encounter for supervision of other normal pregnancy, first trimester: Secondary | ICD-10-CM

## 2015-07-06 ENCOUNTER — Encounter: Payer: Self-pay | Admitting: Certified Nurse Midwife

## 2015-07-06 ENCOUNTER — Encounter: Payer: Managed Care, Other (non HMO) | Admitting: Certified Nurse Midwife

## 2015-07-14 ENCOUNTER — Ambulatory Visit (INDEPENDENT_AMBULATORY_CARE_PROVIDER_SITE_OTHER): Payer: Managed Care, Other (non HMO) | Admitting: Family Medicine

## 2015-07-14 VITALS — BP 117/64 | HR 90 | Temp 98.4°F | Wt 171.2 lb

## 2015-07-14 DIAGNOSIS — Z3483 Encounter for supervision of other normal pregnancy, third trimester: Secondary | ICD-10-CM

## 2015-07-14 DIAGNOSIS — Z3481 Encounter for supervision of other normal pregnancy, first trimester: Secondary | ICD-10-CM

## 2015-07-14 LAB — POCT URINALYSIS DIP (DEVICE)
Bilirubin Urine: NEGATIVE
Bilirubin Urine: NEGATIVE
Glucose, UA: NEGATIVE mg/dL
Glucose, UA: NEGATIVE mg/dL
Hgb urine dipstick: NEGATIVE
Hgb urine dipstick: NEGATIVE
Ketones, ur: NEGATIVE mg/dL
Ketones, ur: NEGATIVE mg/dL
Nitrite: NEGATIVE
Nitrite: NEGATIVE
Protein, ur: 30 mg/dL — AB
Protein, ur: 30 mg/dL — AB
Specific Gravity, Urine: >=1.03 (ref 1.005–1.030)
Specific Gravity, Urine: >=1.03 (ref 1.005–1.030)
Urobilinogen, UA: 0.2 mg/dL (ref 0.0–1.0)
Urobilinogen, UA: 0.2 mg/dL (ref 0.0–1.0)
pH: 6 (ref 5.0–8.0)
pH: 6 (ref 5.0–8.0)

## 2015-07-14 NOTE — Progress Notes (Signed)
Small Leukocytes in urine

## 2015-07-14 NOTE — Progress Notes (Signed)
Subjective:  Madeline Wilkins is a 20 y.o. G2P0010 at [redacted]w[redacted]d being seen today for ongoing prenatal care.  She is currently monitored for the following issues for this low-risk pregnancy and has Encounter for supervision of other normal pregnancy in first trimester; HIV exposure; and Urinary tract infection on her problem list.  Patient reports no complaints.  Contractions: Not present. Vag. Bleeding: None.  Movement: Present. Denies leaking of fluid.   The following portions of the patient's history were reviewed and updated as appropriate: allergies, current medications, past family history, past medical history, past social history, past surgical history and problem list. Problem list updated.  Objective:   Filed Vitals:   07/14/15 1037  BP: 117/64  Pulse: 90  Temp: 98.4 F (36.9 C)  Weight: 171 lb 3.2 oz (77.656 kg)    Fetal Status: Fetal Heart Rate (bpm): 141   Movement: Present     General:  Alert, oriented and cooperative. Patient is in no acute distress.  Skin: Skin is warm and dry. No rash noted.   Cardiovascular: Normal heart rate noted  Respiratory: Normal respiratory effort, no problems with respiration noted  Abdomen: Soft, gravid, appropriate for gestational age. Pain/Pressure: Absent     Pelvic: Vag. Bleeding: None Vag D/C Character: White   Cervical exam deferred        Extremities: Normal range of motion.  Edema: Trace  Mental Status: Normal mood and affect. Normal behavior. Normal judgment and thought content.   Urinalysis:      Assessment and Plan:  Pregnancy: G2P0010 at [redacted]w[redacted]d  1.  Supervision normal first pregnancy. FHT and FH normal.  No concerns.  Preterm labor symptoms and general obstetric precautions including but not limited to vaginal bleeding, contractions, leaking of fluid and fetal movement were reviewed in detail with the patient. Please refer to After Visit Summary for other counseling recommendations.  No Follow-up on file.   Levie HeritageJacob J Stinson,  DO

## 2015-07-14 NOTE — Progress Notes (Deleted)
Trace Leukocytes in urine.  

## 2015-08-08 ENCOUNTER — Encounter: Payer: Managed Care, Other (non HMO) | Admitting: Family

## 2015-08-11 ENCOUNTER — Inpatient Hospital Stay (HOSPITAL_COMMUNITY)
Admission: AD | Admit: 2015-08-11 | Discharge: 2015-08-11 | Disposition: A | Discharge status: Home / Self Care | Payer: Managed Care, Other (non HMO) | Source: Ambulatory Visit | Attending: Obstetrics & Gynecology | Admitting: Obstetrics & Gynecology

## 2015-08-11 ENCOUNTER — Encounter (HOSPITAL_COMMUNITY): Payer: Self-pay

## 2015-08-11 DIAGNOSIS — R42 Dizziness and giddiness: Secondary | ICD-10-CM | POA: Diagnosis present

## 2015-08-11 DIAGNOSIS — Z206 Contact with and (suspected) exposure to human immunodeficiency virus [HIV]: Secondary | ICD-10-CM

## 2015-08-11 DIAGNOSIS — R109 Unspecified abdominal pain: Secondary | ICD-10-CM | POA: Diagnosis not present

## 2015-08-11 DIAGNOSIS — Z7722 Contact with and (suspected) exposure to environmental tobacco smoke (acute) (chronic): Secondary | ICD-10-CM | POA: Insufficient documentation

## 2015-08-11 DIAGNOSIS — O26893 Other specified pregnancy related conditions, third trimester: Secondary | ICD-10-CM | POA: Insufficient documentation

## 2015-08-11 DIAGNOSIS — Z3481 Encounter for supervision of other normal pregnancy, first trimester: Secondary | ICD-10-CM

## 2015-08-11 DIAGNOSIS — Z3A36 36 weeks gestation of pregnancy: Secondary | ICD-10-CM | POA: Insufficient documentation

## 2015-08-11 DIAGNOSIS — O9989 Other specified diseases and conditions complicating pregnancy, childbirth and the puerperium: Secondary | ICD-10-CM

## 2015-08-11 DIAGNOSIS — O26899 Other specified pregnancy related conditions, unspecified trimester: Secondary | ICD-10-CM

## 2015-08-11 LAB — CBC
HCT: 31.1 % — ABNORMAL LOW (ref 36.0–46.0)
Hemoglobin: 10.4 g/dL — ABNORMAL LOW (ref 12.0–15.0)
MCH: 31.1 pg (ref 26.0–34.0)
MCHC: 33.4 g/dL (ref 30.0–36.0)
MCV: 93.1 fL (ref 78.0–100.0)
Platelets: 209 10*3/uL (ref 150–400)
RBC: 3.34 MIL/uL — ABNORMAL LOW (ref 3.87–5.11)
RDW: 13 % (ref 11.5–15.5)
WBC: 6.6 10*3/uL (ref 4.0–10.5)

## 2015-08-11 LAB — URINALYSIS, ROUTINE W REFLEX MICROSCOPIC
Bilirubin Urine: NEGATIVE
Glucose, UA: 100 mg/dL — AB
Hgb urine dipstick: NEGATIVE
Ketones, ur: NEGATIVE mg/dL
Leukocytes, UA: NEGATIVE
Nitrite: NEGATIVE
Protein, ur: NEGATIVE mg/dL
Specific Gravity, Urine: >1.03 — ABNORMAL HIGH (ref 1.005–1.030)
pH: 5.5 (ref 5.0–8.0)

## 2015-08-11 NOTE — MAU Note (Signed)
Notified Dr. Ashok PallWouk patient in room 6 MAU for evaluation of stomach pain and dizziness, MD in delivery will evaluate patient when finished.

## 2015-08-11 NOTE — MAU Provider Note (Signed)
MAU HISTORY AND PHYSICAL  Chief Complaint:  Dizzy and abd pain  Marshal Shirlee LatchMcLean is a 20 y.o.  G2P0010 with IUP at 3243w0d presenting for the above.  Dizziness happened today. Lightheaded. No room spinning. No n/v. No fevers. No palpitations or chest pain. Now resolved. Normal PO.  Abd pain resolved. Comes and goes from time to time for past few weeks. One spot right upper quadrant. Mild. No n/v. Resolves spontaneously. No bleeding or lof. Normal fm   History reviewed. No pertinent past medical history.  History reviewed. No pertinent past surgical history.  No family history on file.  Social History  Substance Use Topics  . Smoking status: Passive Smoke Exposure - Never Smoker  . Smokeless tobacco: None  . Alcohol Use: No    No Known Allergies  Prescriptions prior to admission  Medication Sig Dispense Refill Last Dose  . Prenatal Vit-Fe Fumarate-FA (PRENATAL VITAMINS PLUS) 27-1 MG TABS Take 1 tablet by mouth daily. 30 tablet 11 Past Month at Unknown time    Review of Systems - Negative except for what is mentioned in HPI.  Physical Exam  Blood pressure 118/72, pulse 99, temperature 98.1 F (36.7 C), temperature source Oral, resp. rate 18, last menstrual period 11/23/2014, SpO2 100 %. GENERAL: Well-developed, well-nourished female in no acute distress.  LUNGS: Clear to auscultation bilaterally.  HEART: Regular rate and rhythm. ABDOMEN: Soft, nontender, nondistended, gravid.  EXTREMITIES: Nontender, no edema, 2+ distal pulses. Cervical Exam: closed/thick/high FHT:  145/mod/+a/-d Contractions: 1uiet   Labs: Results for orders placed or performed during the hospital encounter of 08/11/15 (from the past 24 hour(s))  Urinalysis, Routine w reflex microscopic ([facility])   Collection Time: 08/11/15  5:54 PM  Result Value Ref Range   Color, Urine YELLOW YELLOW   APPearance HAZY (A) CLEAR   Specific Gravity, Urine >1.030 (H) 1.005 - 1.030   pH 5.5 5.0 - 8.0   Glucose, UA 100  (A) NEGATIVE mg/dL   Hgb urine dipstick NEGATIVE NEGATIVE   Bilirubin Urine NEGATIVE NEGATIVE   Ketones, ur NEGATIVE NEGATIVE mg/dL   Protein, ur NEGATIVE NEGATIVE mg/dL   Nitrite NEGATIVE NEGATIVE   Leukocytes, UA NEGATIVE NEGATIVE  CBC   Collection Time: 08/11/15  7:40 PM  Result Value Ref Range   WBC 6.6 4.0 - 10.5 K/uL   RBC 3.34 (L) 3.87 - 5.11 MIL/uL   Hemoglobin 10.4 (L) 12.0 - 15.0 g/dL   HCT 29.531.1 (L) 62.136.0 - 30.846.0 %   MCV 93.1 78.0 - 100.0 fL   MCH 31.1 26.0 - 34.0 pg   MCHC 33.4 30.0 - 36.0 g/dL   RDW 65.713.0 84.611.5 - 96.215.5 %   Platelets 209 150 - 400 K/uL    Imaging Studies:  No results found.  Assessment/Plan: Baxter FlatteryDayjai Butson is  20 y.o. G2P0010 at 2243w0d presents with Dizziness and Abdominal Pain  # Dizziness - here asymptomatic. Well appearing. Afebrile. Appears adequately hydrated. Normal UA. No palpitations or chest pain. Does not appear to be describing vertigo. No anemia. - return precautions discussed  # Abdominal pain - also resolved at time of exam. Unlikely liver/pancreas/gallbladder given absence of associated symptoms. May very well be baby kicking. No s/s abruption. Not in labor. NST reactive - abdominal pain, ptl, abruption return precautions  # Pregnancy - overdue for f/u, strongly encouraged - GBS collected    Cherrie Gauzeoah B Dr [facility] Center[facility] 6/9/20178:00 PM

## 2015-08-11 NOTE — Discharge Instructions (Signed)
Dizziness Dizziness is a common problem. It is a feeling of unsteadiness or light-headedness. You may feel like you are about to faint. Dizziness can lead to injury if you stumble or fall. Anyone can become dizzy, but dizziness is more common in older adults. This condition can be caused by a number of things, including medicines, dehydration, or illness. HOME CARE INSTRUCTIONS Taking these steps may help with your condition: Eating and Drinking  Drink enough fluid to keep your urine clear or pale yellow. This helps to keep you from becoming dehydrated. Try to drink more clear fluids, such as water.  Do not drink alcohol.  Limit your caffeine intake if directed by your health care provider.  Limit your salt intake if directed by your health care provider. Activity  Avoid making quick movements.  Rise slowly from chairs and steady yourself until you feel okay.  In the morning, first sit up on the side of the bed. When you feel okay, stand slowly while you hold onto something until you know that your balance is fine.  Move your legs often if you need to stand in one place for a long time. Tighten and relax your muscles in your legs while you are standing.  Do not drive or operate heavy machinery if you feel dizzy.  Avoid bending down if you feel dizzy. Place items in your home so that they are easy for you to reach without leaning over. Lifestyle  Do not use any tobacco products, including cigarettes, chewing tobacco, or electronic cigarettes. If you need help quitting, ask your health care provider.  Try to reduce your stress level, such as with yoga or meditation. Talk with your health care provider if you need help. General Instructions  Watch your dizziness for any changes.  Take medicines only as directed by your health care provider. Talk with your health care provider if you think that your dizziness is caused by a medicine that you are taking.  Tell a friend or a family  member that you are feeling dizzy. If he or she notices any changes in your behavior, have this person call your health care provider.  Keep all follow-up visits as directed by your health care provider. This is important. SEEK MEDICAL CARE IF:  Your dizziness does not go away.  Your dizziness or light-headedness gets worse.  You feel nauseous.  You have reduced hearing.  You have new symptoms.  You are unsteady on your feet or you feel like the room is spinning. SEEK IMMEDIATE MEDICAL CARE IF:  You vomit or have diarrhea and are unable to eat or drink anything.  You have problems talking, walking, swallowing, or using your arms, hands, or legs.  You feel generally weak.  You are not thinking clearly or you have trouble forming sentences. It may take a friend or family member to notice this.  You have chest pain, abdominal pain, shortness of breath, or sweating.  Your vision changes.  You notice any bleeding.  You have a headache.  You have neck pain or a stiff neck.  You have a fever.   This information is not intended to replace advice given to you by your health care provider. Make sure you discuss any questions you have with your health care provider.   Document Released: 08/14/2000 Document Revised: 07/05/2014 Document Reviewed: 02/14/2014 Elsevier Interactive Patient Education 2016 Elsevier Inc. Abdominal Pain During Pregnancy Abdominal pain is common in pregnancy. Most of the time, it does not cause harm.  There are many causes of abdominal pain. Some causes are more serious than others. Some of the causes of abdominal pain in pregnancy are easily diagnosed. Occasionally, the diagnosis takes time to understand. Other times, the cause is not determined. Abdominal pain can be a sign that something is very wrong with the pregnancy, or the pain may have nothing to do with the pregnancy at all. For this reason, always tell your health care provider if you have any  abdominal discomfort. HOME CARE INSTRUCTIONS  Monitor your abdominal pain for any changes. The following actions may help to alleviate any discomfort you are experiencing:  Do not have sexual intercourse or put anything in your vagina until your symptoms go away completely.  Get plenty of rest until your pain improves.  Drink clear fluids if you feel nauseous. Avoid solid food as long as you are uncomfortable or nauseous.  Only take over-the-counter or prescription medicine as directed by your health care provider.  Keep all follow-up appointments with your health care provider. SEEK IMMEDIATE MEDICAL CARE IF:  You are bleeding, leaking fluid, or passing tissue from the vagina.  You have increasing pain or cramping.  You have persistent vomiting.  You have painful or bloody urination.  You have a fever.  You notice a decrease in your baby's movements.  You have extreme weakness or feel faint.  You have shortness of breath, with or without abdominal pain.  You develop a severe headache with abdominal pain.  You have abnormal vaginal discharge with abdominal pain.  You have persistent diarrhea.  You have abdominal pain that continues even after rest, or gets worse. MAKE SURE YOU:   Understand these instructions.  Will watch your condition.  Will get help right away if you are not doing well or get worse.   This information is not intended to replace advice given to you by your health care provider. Make sure you discuss any questions you have with your health care provider.   Document Released: 02/18/2005 Document Revised: 12/09/2012 Document Reviewed: 09/17/2012 Elsevier Interactive Patient Education [employer]

## 2015-08-11 NOTE — MAU Note (Signed)
Pt C/O dizziness, started today, also mid abdominal pain for the last 2 weeks, denies bleeding.

## 2015-08-13 LAB — CULTURE, BETA STREP (GROUP B ONLY)

## 2015-08-13 LAB — OB RESULTS CONSOLE GBS: GBS: NEGATIVE

## 2015-08-23 ENCOUNTER — Encounter: Payer: Self-pay | Admitting: Family

## 2015-08-23 ENCOUNTER — Encounter: Payer: Managed Care, Other (non HMO) | Admitting: Family

## 2015-08-31 ENCOUNTER — Encounter: Payer: Managed Care, Other (non HMO) | Admitting: Obstetrics and Gynecology

## 2015-09-04 ENCOUNTER — Ambulatory Visit (INDEPENDENT_AMBULATORY_CARE_PROVIDER_SITE_OTHER): Payer: Managed Care, Other (non HMO) | Admitting: Obstetrics & Gynecology

## 2015-09-04 VITALS — BP 102/61 | HR 102 | Temp 98.5°F | Wt 176.5 lb

## 2015-09-04 DIAGNOSIS — Z3483 Encounter for supervision of other normal pregnancy, third trimester: Secondary | ICD-10-CM

## 2015-09-04 DIAGNOSIS — Z113 Encounter for screening for infections with a predominantly sexual mode of transmission: Secondary | ICD-10-CM

## 2015-09-04 DIAGNOSIS — Z3493 Encounter for supervision of normal pregnancy, unspecified, third trimester: Secondary | ICD-10-CM

## 2015-09-04 LAB — POCT URINALYSIS DIP (DEVICE)
Bilirubin Urine: NEGATIVE
Glucose, UA: NEGATIVE mg/dL
Hgb urine dipstick: NEGATIVE
Ketones, ur: NEGATIVE mg/dL
Nitrite: NEGATIVE
Protein, ur: 30 mg/dL — AB
Specific Gravity, Urine: >=1.03 (ref 1.005–1.030)
Urobilinogen, UA: 0.2 mg/dL (ref 0.0–1.0)
pH: 5.5 (ref 5.0–8.0)

## 2015-09-04 NOTE — Progress Notes (Signed)
Subjective:  Madeline Wilkins is a 20 y.o. G2P0010 at [redacted]w[redacted]d being seen today for ongoing prenatal care.  She is currently monitored for the following issues for this low-risk pregnancy and has Encounter for supervision of other normal pregnancy in first trimester; HIV exposure; and Urinary tract infection on her problem list.  Patient reports no complaints.  Contractions: Not present. Vag. Bleeding: None.  Movement: Present. Denies leaking of fluid.   The following portions of the patient's history were reviewed and updated as appropriate: allergies, current medications, past family history, past medical history, past social history, past surgical history and problem list. Problem list updated.  Objective:   Filed Vitals:   09/04/15 0832  BP: 102/61  Pulse: 102  Temp: 98.5 F (36.9 C)  Weight: 176 lb 8 oz (80.06 kg)    Fetal Status: Fetal Heart Rate (bpm): 143   Movement: Present     General:  Alert, oriented and cooperative. Patient is in no acute distress.  Skin: Skin is warm and dry. No rash noted.   Cardiovascular: Normal heart rate noted  Respiratory: Normal respiratory effort, no problems with respiration noted  Abdomen: Soft, gravid, appropriate for gestational age. Pain/Pressure: Absent     Pelvic: Cervical exam deferred        Extremities: Normal range of motion.  Edema: Trace  Mental Status: Normal mood and affect. Normal behavior. Normal judgment and thought content.   Urinalysis: Urine Protein: 1+ Urine Glucose: Negative  Assessment and Plan:  Pregnancy: G2P0010 at [redacted]w[redacted]d  There are no diagnoses linked to this encounter. Term labor symptoms and general obstetric precautions including but not limited to vaginal bleeding, contractions, leaking of fluid and fetal movement were reviewed in detail with the patient. Please refer to After Visit Summary for other counseling recommendations.  1 week f/u  Adam PhenixJames G Sharmaine Bain, MD

## 2015-09-04 NOTE — Patient Instructions (Signed)
Vaginal Delivery °During delivery, your health care provider will help you give birth to your baby. During a vaginal delivery, you will work to push the baby out of your vagina. However, before you can push your baby out, a few things need to happen. The opening of your uterus (cervix) has to soften, thin out, and open up (dilate) all the way to 10 cm. Also, your baby has to move down from the uterus into your vagina.  °SIGNS OF LABOR  °Your health care provider will first need to make sure you are in labor. Signs of labor include:  °· Passing what is called the mucous plug before labor begins. This is a small amount of blood-stained mucus. °· Having regular, painful uterine contractions.   °· The time between contractions gets shorter.   °· The discomfort and pain gradually get more intense. °· Contraction pains get worse when walking and do not go away when resting.   °· Your cervix becomes thinner (effacement) and dilates. °BEFORE THE DELIVERY °Once you are in labor and admitted into the hospital or care center, your health care provider may do the following:  °· Perform a complete physical exam. °· Review any complications related to pregnancy or labor.  °· Check your blood pressure, pulse, temperature, and heart rate (vital signs).   °· Determine if, and when, the rupture of amniotic membranes occurred. °· Do a vaginal exam (using a sterile glove and lubricant) to determine:   °¨ The position (presentation) of the baby. Is the baby's head presenting first (vertex) in the birth canal (vagina), or are the feet or buttocks first (breech)?   °¨ The level (station) of the baby's head within the birth canal.   °¨ The effacement and dilatation of the cervix.   °· An electronic fetal monitor is usually placed on your abdomen when you first arrive. This is used to monitor your contractions and the baby's heart rate. °¨ When the monitor is on your abdomen (external fetal monitor), it can only pick up the frequency and  length of your contractions. It cannot tell the strength of your contractions. °¨ If it becomes necessary for your health care provider to know exactly how strong your contractions are or to see exactly what the baby's heart rate is doing, an internal monitor may be inserted into your vagina and uterus. Your health care provider will discuss the benefits and risks of using an internal monitor and obtain your permission before inserting the device. °¨ Continuous fetal monitoring may be needed if you have an epidural, are receiving certain medicines (such as oxytocin), or have pregnancy or labor complications. °· An IV access tube may be placed into a vein in your arm to deliver fluids and medicines if necessary. °THREE STAGES OF LABOR AND DELIVERY °Normal labor and delivery is divided into three stages. °First Stage °This stage starts when you begin to contract regularly and your cervix begins to efface and dilate. It ends when your cervix is completely open (fully dilated). The first stage is the longest stage of labor and can last from 3 hours to 15 hours.  °Several methods are available to help with labor pain. You and your health care provider will decide which option is best for you. Options include:  °· Opioid medicines. These are strong pain medicines that you can get through your IV tube or as a shot into your muscle. These medicines lessen pain but do not make it go away completely.  °· Epidural. A medicine is given through a thin tube that   is inserted in your back. The medicine numbs the lower part of your body and prevents any pain in that area. °· Paracervical pain medicine. This is an injection of an anesthetic on each side of your cervix.   °· You may request natural childbirth, which does not involve the use of pain medicines or an epidural during labor and delivery. Instead, you will use other things, such as breathing exercises, to help cope with the pain. °Second Stage °The second stage of labor  begins when your cervix is fully dilated at 10 cm. It continues until you push your baby down through the birth canal and the baby is born. This stage can take only minutes or several hours. °· The location of your baby's head as it moves through the birth canal is reported as a number called a station. If the baby's head has not started its descent, the station is described as being at minus 3 (-3). When your baby's head is at the zero station, it is at the middle of the birth canal and is engaged in the pelvis. The station of your baby helps indicate the progress of the second stage of labor. °· When your baby is born, your health care provider may hold the baby with his or her head lowered to prevent amniotic fluid, mucus, and blood from getting into the baby's lungs. The baby's mouth and nose may be suctioned with a small bulb syringe to remove any additional fluid. °· Your health care provider may then place the baby on your stomach. It is important to keep the baby from getting cold. To do this, the health care provider will dry the baby off, place the baby directly on your skin (with no blankets between you and the baby), and cover the baby with warm, dry blankets.   °· The umbilical cord is cut. °Third Stage °During the third stage of labor, your health care provider will deliver the placenta (afterbirth) and make sure your bleeding is under control. The delivery of the placenta usually takes about 5 minutes but can take up to 30 minutes. After the placenta is delivered, a medicine may be given either by IV or injection to help contract the uterus and control bleeding. If you are planning to breastfeed, you can try to do so now. °After you deliver the placenta, your uterus should contract and get very firm. If your uterus does not remain firm, your health care provider will massage it. This is important because the contraction of the uterus helps cut off bleeding at the site where the placenta was attached  to your uterus. If your uterus does not contract properly and stay firm, you may continue to bleed heavily. If there is a lot of bleeding, medicines may be given to contract the uterus and stop the bleeding.  °  °This information is not intended to replace advice given to you by your health care provider. Make sure you discuss any questions you have with your health care provider. °  °Document Released: 11/28/2007 Document Revised: 03/11/2014 Document Reviewed: 10/16/2011 °Elsevier Interactive Patient Education ©2016 [employer] ° °

## 2015-09-06 LAB — GC/CHLAMYDIA PROBE AMP (~~LOC~~) NOT AT ARMC
Chlamydia: NEGATIVE
Neisseria Gonorrhea: NEGATIVE

## 2015-09-06 LAB — OB RESULTS CONSOLE GC/CHLAMYDIA: Gonorrhea: NEGATIVE

## 2015-09-11 ENCOUNTER — Ambulatory Visit (INDEPENDENT_AMBULATORY_CARE_PROVIDER_SITE_OTHER): Payer: Managed Care, Other (non HMO) | Admitting: Certified Nurse Midwife

## 2015-09-11 VITALS — BP 110/68 | HR 84 | Wt 175.4 lb

## 2015-09-11 DIAGNOSIS — Z3481 Encounter for supervision of other normal pregnancy, first trimester: Secondary | ICD-10-CM

## 2015-09-11 DIAGNOSIS — Z206 Contact with and (suspected) exposure to human immunodeficiency virus [HIV]: Secondary | ICD-10-CM

## 2015-09-11 NOTE — Progress Notes (Signed)
Subjective:  Madeline Wilkins is a 20 y.o. G2P0010 at 5842w3d being seen today for ongoing prenatal care.  She is currently monitored for the following issues for this low-risk pregnancy and has Encounter for supervision of other normal pregnancy in first trimester; HIV exposure; and Urinary tract infection on her problem list.  Patient reports no complaints.  Contractions: Not present. Vag. Bleeding: None.  Movement: Present. Denies leaking of fluid.   The following portions of the patient's history were reviewed and updated as appropriate: allergies, current medications, past family history, past medical history, past social history, past surgical history and problem list. Problem list updated.  Objective:   Filed Vitals:   09/11/15 1500  BP: 110/68  Pulse: 84  Weight: 175 lb 6.4 oz (79.561 kg)    Fetal Status: Fetal Heart Rate (bpm): 140 Fundal Height: 38 cm Movement: Present     General:  Alert, oriented and cooperative. Patient is in no acute distress.  Skin: Skin is warm and dry. No rash noted.   Cardiovascular: Normal heart rate noted  Respiratory: Normal respiratory effort, no problems with respiration noted  Abdomen: Soft, gravid, appropriate for gestational age. Pain/Pressure: Absent     Pelvic: Vag. Bleeding: None     Cervical exam performed        Extremities: Normal range of motion.  Edema: Trace  Mental Status: Normal mood and affect. Normal behavior. Normal judgment and thought content.   Urinalysis: Urine Protein: Negative Urine Glucose: Negative   Assessment and Plan:  Pregnancy: G2P0010 at 3442w3d  1. 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 Encounter for supervision of other normal pregnancy in third trimester - NST this week - IOL 09/18/15 #0730   Term labor symptoms and general obstetric precautions including but not limited to vaginal bleeding, contractions, leaking of fluid and fetal movement were reviewed in detail with the patient. Please  refer to After Visit Summary for other counseling recommendations.    Donette LarryMelanie Tyshawn Ciullo, CNM

## 2015-09-12 ENCOUNTER — Telehealth (HOSPITAL_COMMUNITY): Payer: Self-pay | Admitting: *Deleted

## 2015-09-12 ENCOUNTER — Encounter: Payer: Self-pay | Admitting: *Deleted

## 2015-09-12 NOTE — Telephone Encounter (Signed)
Preadmission screen  

## 2015-09-13 ENCOUNTER — Ambulatory Visit (INDEPENDENT_AMBULATORY_CARE_PROVIDER_SITE_OTHER): Payer: Managed Care, Other (non HMO) | Admitting: Advanced Practice Midwife

## 2015-09-13 VITALS — BP 114/66 | HR 88

## 2015-09-13 DIAGNOSIS — O480 Post-term pregnancy: Secondary | ICD-10-CM | POA: Diagnosis not present

## 2015-09-13 DIAGNOSIS — Z36 Encounter for antenatal screening of mother: Secondary | ICD-10-CM | POA: Diagnosis not present

## 2015-09-13 NOTE — Progress Notes (Signed)
NST reactive.

## 2015-09-13 NOTE — Progress Notes (Signed)
Pt encouraged to increase po fluid intake.  

## 2015-09-17 ENCOUNTER — Inpatient Hospital Stay (HOSPITAL_COMMUNITY)
Admission: AD | Admit: 2015-09-17 | Discharge: 2015-09-17 | Disposition: A | Discharge status: Home / Self Care | Payer: Managed Care, Other (non HMO) | Source: Ambulatory Visit | Attending: Obstetrics & Gynecology | Admitting: Obstetrics & Gynecology

## 2015-09-17 ENCOUNTER — Encounter (HOSPITAL_COMMUNITY): Payer: Self-pay | Admitting: *Deleted

## 2015-09-17 ENCOUNTER — Inpatient Hospital Stay (HOSPITAL_COMMUNITY): Payer: Managed Care, Other (non HMO) | Admitting: Anesthesiology

## 2015-09-17 ENCOUNTER — Inpatient Hospital Stay (HOSPITAL_COMMUNITY)
Admission: AD | Admit: 2015-09-17 | Discharge: 2015-09-18 | DRG: 775 | Disposition: A | Discharge status: Home / Self Care | Payer: Managed Care, Other (non HMO) | Source: Ambulatory Visit | Attending: Obstetrics & Gynecology | Admitting: Obstetrics & Gynecology

## 2015-09-17 DIAGNOSIS — Z3A41 41 weeks gestation of pregnancy: Secondary | ICD-10-CM

## 2015-09-17 DIAGNOSIS — Z206 Contact with and (suspected) exposure to human immunodeficiency virus [HIV]: Secondary | ICD-10-CM

## 2015-09-17 DIAGNOSIS — O480 Post-term pregnancy: Principal | ICD-10-CM | POA: Diagnosis present

## 2015-09-17 DIAGNOSIS — IMO0001 Reserved for inherently not codable concepts without codable children: Secondary | ICD-10-CM

## 2015-09-17 DIAGNOSIS — Z3481 Encounter for supervision of other normal pregnancy, first trimester: Secondary | ICD-10-CM

## 2015-09-17 HISTORY — DX: Calculus of kidney: N20.0

## 2015-09-17 HISTORY — DX: Unspecified infectious disease: B99.9

## 2015-09-17 HISTORY — DX: Gonococcal infection, unspecified: A54.9

## 2015-09-17 HISTORY — DX: Trichomoniasis, unspecified: A59.9

## 2015-09-17 LAB — CBC
HCT: 34.6 % — ABNORMAL LOW (ref 36.0–46.0)
Hemoglobin: 11.7 g/dL — ABNORMAL LOW (ref 12.0–15.0)
MCH: 31.1 pg (ref 26.0–34.0)
MCHC: 33.8 g/dL (ref 30.0–36.0)
MCV: 92 fL (ref 78.0–100.0)
Platelets: 201 10*3/uL (ref 150–400)
RBC: 3.76 MIL/uL — ABNORMAL LOW (ref 3.87–5.11)
RDW: 12.8 % (ref 11.5–15.5)
WBC: 8.9 10*3/uL (ref 4.0–10.5)

## 2015-09-17 LAB — TYPE AND SCREEN
ABO/RH(D): A POS
Antibody Screen: NEGATIVE

## 2015-09-17 LAB — RAPID HIV SCREEN (HIV 1/2 AB+AG)
HIV 1/2 Antibodies: NONREACTIVE
HIV-1 P24 Antigen - HIV24: NONREACTIVE

## 2015-09-17 LAB — ABO/RH: ABO/RH(D): A POS

## 2015-09-17 MED ORDER — ACETAMINOPHEN 325 MG PO TABS: 650.0000 mg | ORAL_TABLET | ORAL | Status: DC | PRN

## 2015-09-17 MED ORDER — SODIUM CHLORIDE 0.9 % IV SOLN: 250.0000 mL | INTRAVENOUS | Status: DC | PRN

## 2015-09-17 MED ORDER — ONDANSETRON HCL 4 MG/2ML IJ SOLN
4.0000 mg | Freq: Four times a day (QID) | INTRAMUSCULAR | Status: DC | PRN
  Filled 2015-09-17: qty 2

## 2015-09-17 MED ORDER — OXYCODONE-ACETAMINOPHEN 5-325 MG PO TABS
1.0000 | ORAL_TABLET | ORAL | Status: DC | PRN
Start: 2015-09-17 — End: 2015-09-18

## 2015-09-17 MED ORDER — OXYTOCIN 40 UNITS IN LACTATED RINGERS INFUSION - SIMPLE MED
2.5000 [IU]/h | INTRAVENOUS | Status: DC
  Filled 2015-09-17: qty 1000

## 2015-09-17 MED ORDER — LACTATED RINGERS IV SOLN
INTRAVENOUS | Status: DC
  Administered 2015-09-17: 15:00:00 via INTRAVENOUS

## 2015-09-17 MED ORDER — MEASLES, MUMPS & RUBELLA VAC ~~LOC~~ INJ
0.5000 mL | INJECTION | Freq: Once | SUBCUTANEOUS | Status: DC
  Filled 2015-09-17: qty 0.5

## 2015-09-17 MED ORDER — FENTANYL CITRATE (PF) 100 MCG/2ML IJ SOLN
INTRAMUSCULAR | Status: AC
  Administered 2015-09-17: 100 ug via INTRAVENOUS
  Filled 2015-09-17: qty 2

## 2015-09-17 MED ORDER — LIDOCAINE HCL (PF) 1 % IJ SOLN
INTRAMUSCULAR | Status: DC | PRN
  Administered 2015-09-17 (×2): 5 mL via EPIDURAL

## 2015-09-17 MED ORDER — COCONUT OIL OIL: 1.0000 "application " | TOPICAL_OIL | Status: DC | PRN

## 2015-09-17 MED ORDER — SOD CITRATE-CITRIC ACID 500-334 MG/5ML PO SOLN: 30.0000 mL | ORAL | Status: DC | PRN

## 2015-09-17 MED ORDER — TETANUS-DIPHTH-ACELL PERTUSSIS 5-2.5-18.5 LF-MCG/0.5 IM SUSP: 0.5000 mL | Freq: Once | INTRAMUSCULAR | Status: DC

## 2015-09-17 MED ORDER — EPHEDRINE 5 MG/ML INJ
10.0000 mg | INTRAVENOUS | Status: DC | PRN
  Filled 2015-09-17: qty 2

## 2015-09-17 MED ORDER — OXYTOCIN BOLUS FROM INFUSION
500.0000 mL | INTRAVENOUS | Status: DC
  Administered 2015-09-17: 500 mL via INTRAVENOUS

## 2015-09-17 MED ORDER — OXYCODONE-ACETAMINOPHEN 5-325 MG PO TABS: 2.0000 | ORAL_TABLET | ORAL | Status: DC | PRN

## 2015-09-17 MED ORDER — ONDANSETRON HCL 4 MG PO TABS: 4.0000 mg | ORAL_TABLET | ORAL | Status: DC | PRN

## 2015-09-17 MED ORDER — PHENYLEPHRINE 40 MCG/ML (10ML) SYRINGE FOR IV PUSH (FOR BLOOD PRESSURE SUPPORT)
80.0000 ug | PREFILLED_SYRINGE | INTRAVENOUS | Status: DC | PRN
  Filled 2015-09-17: qty 5
  Filled 2015-09-17: qty 10

## 2015-09-17 MED ORDER — SODIUM CHLORIDE 0.9% FLUSH: 3.0000 mL | Freq: Two times a day (BID) | INTRAVENOUS | Status: DC

## 2015-09-17 MED ORDER — LACTATED RINGERS IV SOLN: 500.0000 mL | Freq: Once | INTRAVENOUS | Status: DC

## 2015-09-17 MED ORDER — IBUPROFEN 600 MG PO TABS
600.0000 mg | ORAL_TABLET | Freq: Four times a day (QID) | ORAL | Status: DC
  Administered 2015-09-17 – 2015-09-18 (×4): 600 mg via ORAL
  Filled 2015-09-17 (×4): qty 1

## 2015-09-17 MED ORDER — LIDOCAINE HCL (PF) 1 % IJ SOLN
30.0000 mL | INTRAMUSCULAR | Status: DC | PRN
  Administered 2015-09-17: 30 mL via SUBCUTANEOUS
  Filled 2015-09-17: qty 30

## 2015-09-17 MED ORDER — DIBUCAINE 1 % RE OINT: 1.0000 "application " | TOPICAL_OINTMENT | RECTAL | Status: DC | PRN

## 2015-09-17 MED ORDER — PRENATAL MULTIVITAMIN CH
1.0000 | ORAL_TABLET | Freq: Every day | ORAL | Status: DC
  Administered 2015-09-18: 1 via ORAL
  Filled 2015-09-17: qty 1

## 2015-09-17 MED ORDER — DIPHENHYDRAMINE HCL 50 MG/ML IJ SOLN: 12.5000 mg | INTRAMUSCULAR | Status: DC | PRN

## 2015-09-17 MED ORDER — BENZOCAINE-MENTHOL 20-0.5 % EX AERO
1.0000 "application " | INHALATION_SPRAY | CUTANEOUS | Status: DC | PRN
  Administered 2015-09-17: 1 via TOPICAL
  Filled 2015-09-17: qty 56

## 2015-09-17 MED ORDER — ONDANSETRON HCL 4 MG/2ML IJ SOLN: 4.0000 mg | INTRAMUSCULAR | Status: DC | PRN

## 2015-09-17 MED ORDER — SENNOSIDES-DOCUSATE SODIUM 8.6-50 MG PO TABS
2.0000 | ORAL_TABLET | ORAL | Status: DC
  Administered 2015-09-17: 2 via ORAL
  Filled 2015-09-17: qty 2

## 2015-09-17 MED ORDER — SIMETHICONE 80 MG PO CHEW: 80.0000 mg | CHEWABLE_TABLET | ORAL | Status: DC | PRN

## 2015-09-17 MED ORDER — LACTATED RINGERS IV SOLN: 500.0000 mL | INTRAVENOUS | Status: DC | PRN

## 2015-09-17 MED ORDER — ZOLPIDEM TARTRATE 5 MG PO TABS: 5.0000 mg | ORAL_TABLET | Freq: Every evening | ORAL | Status: DC | PRN

## 2015-09-17 MED ORDER — FENTANYL CITRATE (PF) 100 MCG/2ML IJ SOLN
100.0000 ug | Freq: Once | INTRAMUSCULAR | Status: AC
  Administered 2015-09-17: 100 ug via INTRAVENOUS

## 2015-09-17 MED ORDER — FLEET ENEMA 7-19 GM/118ML RE ENEM: 1.0000 | ENEMA | RECTAL | Status: DC | PRN

## 2015-09-17 MED ORDER — FENTANYL 2.5 MCG/ML BUPIVACAINE 1/10 % EPIDURAL INFUSION (WH - ANES)
14.0000 mL/h | INTRAMUSCULAR | Status: DC | PRN
  Administered 2015-09-17: 14 mL/h via EPIDURAL
  Filled 2015-09-17: qty 125

## 2015-09-17 MED ORDER — SODIUM CHLORIDE 0.9% FLUSH: 3.0000 mL | INTRAVENOUS | Status: DC | PRN

## 2015-09-17 MED ORDER — DIPHENHYDRAMINE HCL 25 MG PO CAPS: 25.0000 mg | ORAL_CAPSULE | Freq: Four times a day (QID) | ORAL | Status: DC | PRN

## 2015-09-17 MED ORDER — WITCH HAZEL-GLYCERIN EX PADS: 1.0000 "application " | MEDICATED_PAD | CUTANEOUS | Status: DC | PRN

## 2015-09-17 MED ORDER — PHENYLEPHRINE 40 MCG/ML (10ML) SYRINGE FOR IV PUSH (FOR BLOOD PRESSURE SUPPORT)
80.0000 ug | PREFILLED_SYRINGE | INTRAVENOUS | Status: DC | PRN
  Filled 2015-09-17: qty 5

## 2015-09-17 NOTE — Anesthesia Preprocedure Evaluation (Signed)
Anesthesia Evaluation  Patient identified by MRN, date of birth, ID band Patient awake    Reviewed: Allergy & Precautions, H&P , Patient's Chart, lab work & pertinent test results  Airway Mallampati: II  TM Distance: >3 FB Neck ROM: full    Dental no notable dental hx. (+) Teeth Intact   Pulmonary neg pulmonary ROS,    Pulmonary exam normal breath sounds clear to auscultation       Cardiovascular negative cardio ROS Normal cardiovascular exam Rhythm:regular Rate:Normal     Neuro/Psych negative neurological ROS  negative psych ROS   GI/Hepatic negative GI ROS, Neg liver ROS,   Endo/Other  negative endocrine ROS  Renal/GU negative Renal ROS  negative genitourinary   Musculoskeletal   Abdominal   Peds  Hematology  (+) anemia ,   Anesthesia Other Findings   Reproductive/Obstetrics (+) Pregnancy                             Anesthesia Physical Anesthesia Plan  ASA: II  Anesthesia Plan: Epidural   Post-op Pain Management:    Induction:   Airway Management Planned:   Additional Equipment:   Intra-op Plan:   Post-operative Plan:   Informed Consent: I have reviewed the patients History and Physical, chart, labs and discussed the procedure including the risks, benefits and alternatives for the proposed anesthesia with the patient or authorized representative who has indicated his/her understanding and acceptance.     Plan Discussed with: Anesthesiologist  Anesthesia Plan Comments:         Anesthesia Quick Evaluation  

## 2015-09-17 NOTE — H&P (Signed)
Madeline Wilkins is a 20 y.o. female presenting for contractions since early this morning. Maternal Medical History:  Reason for admission: Contractions.   Contractions: Onset was 6-12 hours ago.    Fetal activity: Perceived fetal activity is normal.    Prenatal complications: no prenatal complications Prenatal Complications - Diabetes: none.    OB History    Gravida Para Term Preterm AB TAB SAB Ectopic Multiple Living   2    1 1          Past Medical History  Diagnosis Date  . Infection     UTI  . Kidney stone   . Gonorrhea   . Trichomonal infection    Past Surgical History  Procedure Laterality Date  . Induced abortion     Family History: family history is negative for Cancer, Diabetes, Heart disease, Hypertension, Stroke, Hearing loss, and Asthma. Social History:  reports that she has been passively smoking.  She has never used smokeless tobacco. She reports that she does not drink alcohol or use illicit drugs.   Prenatal Transfer Tool  Maternal Diabetes: No Genetic Screening: Normal Maternal Ultrasounds/Referrals: Normal Fetal Ultrasounds or other Referrals:  None Maternal Substance Abuse:  No Significant Maternal Medications:  None Significant Maternal Lab Results:  None Other Comments:  None  Review of Systems  Constitutional: Negative.   HENT: Negative.   Eyes: Negative.   Respiratory: Negative.   Cardiovascular: Negative.   Gastrointestinal: Positive for abdominal pain.  Genitourinary: Negative.   Musculoskeletal: Negative.   Skin: Negative.   Neurological: Negative.   Endo/Heme/Allergies: Negative.   Psychiatric/Behavioral: Negative.     Dilation: 5 Effacement (%): 100 Station: -2 Exam by:: jolynn Blood pressure 113/87, pulse 92, temperature 98.2 F (36.8 C), temperature source Oral, resp. rate 20, last menstrual period 11/23/2014. Maternal Exam:  Uterine Assessment: Contraction strength is moderate.  Contraction frequency is regular.    Abdomen: Patient reports no abdominal tenderness. Fetal presentation: vertex  Introitus: Normal vulva. Normal vagina.  Amniotic fluid character: not assessed.  Pelvis: adequate for delivery.   Cervix: Cervix evaluated by digital exam.     Fetal Exam Fetal Monitor Review: Mode: ultrasound.   Variability: moderate (6-25 bpm).   Pattern: accelerations present.    Fetal State Assessment: Category I - tracings are normal.     Physical Exam  Constitutional: She is oriented to person, place, and time. She appears well-developed and well-nourished.  HENT:  Head: Normocephalic.  Eyes: Pupils are equal, round, and reactive to light.  Neck: Normal range of motion.  Cardiovascular: Normal rate, regular rhythm, normal heart sounds and intact distal pulses.   Respiratory: Effort normal and breath sounds normal.  GI: Soft. Bowel sounds are normal.  Genitourinary: Vagina normal and uterus normal.  Musculoskeletal: Normal range of motion.  Neurological: She is alert and oriented to person, place, and time. She has normal reflexes.  Skin: Skin is warm and dry.  Psychiatric: She has a normal mood and affect. Her behavior is normal. Judgment and thought content normal.    Prenatal labs: ABO, Rh: A/POS/-- (12/22 1458) Antibody: NEG (12/22 1458) Rubella: 3.13 (12/22 1458) RPR: NON REAC (04/19 1127)  HBsAg: NEGATIVE (12/22 1458)  HIV: NONREACTIVE (03/22 1118)  GBS:     Assessment/Plan: SVE 5/100/-2. Admit anticipate vag delivery.   Madeline Wilkins 09/17/2015, 2:12 PM

## 2015-09-17 NOTE — Anesthesia Procedure Notes (Signed)
Epidural Patient location during procedure: OB Start time: 09/17/2015 3:34 PM  Staffing Anesthesiologist: Mal AmabileFOSTER, Jaylyn Booher Performed by: anesthesiologist   Preanesthetic Checklist Completed: patient identified, site marked, surgical consent, pre-op evaluation, timeout performed, IV checked, risks and benefits discussed and monitors and equipment checked  Epidural Patient position: sitting Prep: site prepped and draped and DuraPrep Patient monitoring: continuous pulse ox and blood pressure Approach: midline Location: L3-L4 Injection technique: LOR air  Needle:  Needle type: Tuohy  Needle gauge: 17 G Needle length: 9 cm and 9 Needle insertion depth: 4 cm Catheter type: closed end flexible Catheter size: 19 Gauge Catheter at skin depth: 9 cm Test dose: negative and Other  Assessment Events: blood not aspirated, injection not painful, no injection resistance, negative IV test and no paresthesia  Additional Notes Patient identified. Risks and benefits discussed including failed block, incomplete  Pain control, post dural puncture headache, nerve damage, paralysis, blood pressure Changes, nausea, vomiting, reactions to medications-both toxic and allergic and post Partum back pain. All questions were answered. Patient expressed understanding and wished to proceed. Sterile technique was used throughout procedure. Epidural site was Dressed with sterile barrier dressing. No paresthesias, signs of intravascular injection Or signs of intrathecal spread were encountered.  Patient was more comfortable after the epidural was dosed. Please see RN's note for documentation of vital signs and FHR which are stable.

## 2015-09-17 NOTE — MAU Note (Signed)
Contractions and bleeding continues.  Pains are worse now.  Pt tearful. Has support people with her this trip

## 2015-09-17 NOTE — Discharge Instructions (Signed)
Braxton Hicks Contractions °Contractions of the uterus can occur throughout pregnancy. Contractions are not always a sign that you are in labor.  °WHAT ARE BRAXTON HICKS CONTRACTIONS?  °Contractions that occur before labor are called Braxton Hicks contractions, or false labor. Toward the end of pregnancy (32-34 weeks), these contractions can develop more often and may become more forceful. This is not true labor because these contractions do not result in opening (dilatation) and thinning of the cervix. They are sometimes difficult to tell apart from true labor because these contractions can be forceful and people have different pain tolerances. You should not feel embarrassed if you go to the hospital with false labor. Sometimes, the only way to tell if you are in true labor is for your health care provider to look for changes in the cervix. °If there are no prenatal problems or other health problems associated with the pregnancy, it is completely safe to be sent home with false labor and await the onset of true labor. °HOW CAN YOU TELL THE DIFFERENCE BETWEEN TRUE AND FALSE LABOR? °False Labor °· The contractions of false labor are usually shorter and not as hard as those of true labor.   °· The contractions are usually irregular.   °· The contractions are often felt in the front of the lower abdomen and in the groin.   °· The contractions may go away when you walk around or change positions while lying down.   °· The contractions get weaker and are shorter lasting as time goes on.   °· The contractions do not usually become progressively stronger, regular, and closer together as with true labor.   °True Labor °· Contractions in true labor last 30-70 seconds, become very regular, usually become more intense, and increase in frequency.   °· The contractions do not go away with walking.   °· The discomfort is usually felt in the top of the uterus and spreads to the lower abdomen and low back.   °· True labor can be  determined by your health care provider with an exam. This will show that the cervix is dilating and getting thinner.   °WHAT TO REMEMBER °· Keep up with your usual exercises and follow other instructions given by your health care provider.   °· Take medicines as directed by your health care provider.   °· Keep your regular prenatal appointments.   °· Eat and drink lightly if you think you are going into labor.   °· If Braxton Hicks contractions are making you uncomfortable:   °¨ Change your position from lying down or resting to walking, or from walking to resting.   °¨ Sit and rest in a tub of warm water.   °¨ Drink 2-3 glasses of water. Dehydration may cause these contractions.   °¨ Do slow and deep breathing several times an hour.   °WHEN SHOULD I SEEK IMMEDIATE MEDICAL CARE? °Seek immediate medical care if: °· Your contractions become stronger, more regular, and closer together.   °· You have fluid leaking or gushing from your vagina.   °· You have a fever.   °· You pass blood-tinged mucus.   °· You have vaginal bleeding.   °· You have continuous abdominal pain.   °· You have low back pain that you never had before.   °· You feel your baby's head pushing down and causing pelvic pressure.   °· Your baby is not moving as much as it used to.   °  °This information is not intended to replace advice given to you by your health care provider. Make sure you discuss any questions you have with your health care   provider. °  °Document Released: 02/18/2005 Document Revised: 02/23/2013 Document Reviewed: 11/30/2012 °Elsevier Interactive Patient Education ©2016 [employer] ° °

## 2015-09-17 NOTE — MAU Note (Signed)
Pain woke her this morning, unable to go back to sleep.  Has had some bleeding, no water coming out

## 2015-09-18 ENCOUNTER — Inpatient Hospital Stay (HOSPITAL_COMMUNITY): Admission: RE | Admit: 2015-09-18 | Payer: Managed Care, Other (non HMO) | Source: Ambulatory Visit

## 2015-09-18 LAB — RPR: RPR Ser Ql: NONREACTIVE

## 2015-09-18 MED ORDER — IBUPROFEN 600 MG PO TABS: 600.0000 mg | ORAL_TABLET | Freq: Four times a day (QID) | ORAL | Status: DC

## 2015-09-18 NOTE — Discharge Summary (Signed)
        OB Discharge Summary  Patient Name: Madeline Wilkins DOB: 04/28/1995 MRN: [id number]  Date of admission: 09/17/2015 Delivering MD: Wyvonnia DuskyLAWSON, Amberlin Utke D   Date of discharge: 09/18/2015  Admitting diagnosis: 42 WKS, BLEEDING, PAIN Intrauterine pregnancy: 3611w2d     Secondary diagnosis:Active Problems:   Active labor at term  Additional problems:none     Discharge diagnosis: Term Pregnancy Delivered                                                                     Post partum procedures:none  Augmentation: none  Complications: None  Hospital course:  Onset of Labor With Vaginal Delivery     20 y.o. yo G2P1011 at 6711w2d was admitted in Active Labor on 09/17/2015. Patient had an uncomplicated labor course as follows:  Membrane Rupture Time/Date: 3:30 PM ,09/17/2015   Intrapartum Procedures: Episiotomy: None [1]                                         Lacerations:  Perineal [11]  Patient had a delivery of a Viable infant. 09/17/2015  Information for the patient's newborn:  Madeline Wilkins, Girl Jo-Ann [[id number]][[id number]]  Delivery Method: Vaginal, Spontaneous Delivery (Filed from Delivery Summary)    Pateint had an uncomplicated postpartum course.  She is ambulating, tolerating a regular diet, passing flatus, and urinating well. Patient is discharged home in stable condition on 09/18/2015.    Physical exam  Filed Vitals:   09/17/15 1702 09/17/15 1721 09/17/15 1817 09/18/15 0515  BP: 119/87 118/75 127/72 109/68  Pulse: 96 91 96 98  Temp:  98.4 F (36.9 C) 98.5 F (36.9 C) 98.6 F (37 C)  TempSrc:  Oral Oral Oral  Resp: 18 18 17 18   Height:      Weight:       General: alert, cooperative and no distress Lochia: appropriate Uterine Fundus: firm Incision: N/A DVT Evaluation: Negative Homan's sign. No cords or calf tenderness. No significant calf/ankle edema. Labs: Lab Results  Component Value Date   WBC 8.9 09/17/2015   HGB 11.7* 09/17/2015   HCT 34.6* 09/17/2015   MCV 92.0  09/17/2015   PLT 201 09/17/2015   No flowsheet data found.  Discharge instruction: per After Visit Summary and "Baby and Me Booklet".  After Visit Meds:    Medication List    ASK your doctor about these medications        PRENATAL VITAMINS PLUS 27-1 MG Tabs  Take 1 tablet by mouth daily.        Diet: routine diet  Activity: Advance as tolerated. Pelvic rest for 6 weeks.   Outpatient follow up:6 weeks Follow up Appt:Future Appointments Date Time Provider Department Center  10/26/2015 9:40 AM Levie HeritageJacob J Stinson, DO [facility]   Follow up visit: No Follow-up on file.  Postpartum contraception: Nexplanon  Newborn Data: Live born female  Birth Weight: 7 lb 12.5 oz (3530 g) APGAR: 9, 9  Baby Feeding: Bottle Disposition:home with mother   09/18/2015 Wyvonnia DuskyMarie Winna Golla, CNM

## 2015-09-18 NOTE — Anesthesia Postprocedure Evaluation (Signed)
Anesthesia Post Note  Patient: Madeline Wilkins  Procedure(s) Performed: * No procedures listed *  Patient location during evaluation: Mother Baby Anesthesia Type: Epidural Level of consciousness: awake, awake and alert, oriented and patient cooperative Pain management: pain level controlled Vital Signs Assessment: post-procedure vital signs reviewed and stable Respiratory status: spontaneous breathing, nonlabored ventilation and respiratory function stable Cardiovascular status: stable Postop Assessment: no headache, no backache, patient able to bend at knees and no signs of nausea or vomiting Anesthetic complications: no     Last Vitals:  Filed Vitals:   09/17/15 1817 09/18/15 0515  BP: 127/72 109/68  Pulse: 96 98  Temp: 36.9 C 37 C  Resp: 17 18    Last Pain:  Filed Vitals:   09/18/15 0516  PainSc: 0-No pain   Pain Goal:                 Chrisopher Pustejovsky L

## 2015-09-18 NOTE — Lactation Note (Signed)
This note was copied from a baby's chart. Lactation Consultation Note Mom BF once right after delivery but hasn't BF since. Mom states she is only bottle formula feeding now.  Patient Name: Madeline Wilkins [id number]'s Date: 09/18/2015     Maternal Data    Feeding    LATCH Score/Interventions                      Lactation Tools Discussed/Used     Consult Status Consult Status: Complete Date: 09/18/15    Charyl DancerCARVER, Sylvia Helms G 09/18/2015, 4:31 AM

## 2015-10-26 ENCOUNTER — Ambulatory Visit: Payer: Managed Care, Other (non HMO) | Admitting: Family Medicine

## 2015-10-30 ENCOUNTER — Encounter: Payer: Self-pay | Admitting: Obstetrics and Gynecology

## 2015-11-17 ENCOUNTER — Encounter: Payer: Self-pay | Admitting: Obstetrics and Gynecology

## 2015-11-17 ENCOUNTER — Ambulatory Visit (INDEPENDENT_AMBULATORY_CARE_PROVIDER_SITE_OTHER): Payer: Managed Care, Other (non HMO) | Admitting: Obstetrics and Gynecology

## 2015-11-17 VITALS — BP 140/81 | HR 76 | Wt 147.1 lb

## 2015-11-17 DIAGNOSIS — Z113 Encounter for screening for infections with a predominantly sexual mode of transmission: Secondary | ICD-10-CM | POA: Diagnosis not present

## 2015-11-17 DIAGNOSIS — Z7251 High risk heterosexual behavior: Secondary | ICD-10-CM

## 2015-11-17 DIAGNOSIS — I10 Essential (primary) hypertension: Secondary | ICD-10-CM

## 2015-11-17 LAB — POCT PREGNANCY, URINE: Preg Test, Ur: NEGATIVE

## 2015-11-17 NOTE — Progress Notes (Signed)
Obstetrics Visit Postpartum Visit  Appointment Date: 11/17/2015  [facility]  Primary Care Provider: No PCP Per Patient  Chief Complaint:  Chief Complaint  Patient presents with  . Postpartum Care  unprotected sex Vaginal discharge  History of Present Illness: Madeline Wilkins is a 20 y.o. African-American G2P1011 (No LMP recorded.), seen for the above chief complaint. Her past medical history is significant for nothing.   She is s/p 7-16 on SVD/unknown laceration; she was discharged to home on PPD#1  LMP: No . Vaginal bleeding or discharge: No  Breast or formula feeding: formula Intercourse: Yes, most recently one week ago; no issues. Her partner doesn't have HIV but was exposed to it during her pregnancy.  Contraception after delivery: No  PP depression s/s: No  Any bowel or bladder issues: No  Pap smear: atypical squamous cellularity of undetermined significance (ASCUS) (date: 2017)  No s/s of pre-eclampsia  Review of Systems:  Her 12 point review of systems is negative or as noted in the History of Present Illness.  Medications None  Allergies Review of patient's allergies indicates no known allergies.  Physical Exam:  BP 140/81 (BP Location: Right Arm, Patient Position: Sitting, Cuff Size: Normal)   Pulse 76   Wt 66.7 kg (147 lb 1.6 oz)   Breastfeeding? No   BMI 24.48 kg/m  Body mass index is 24.48 kg/m. General appearance: Well nourished, well developed female in no acute distress.  Respiratory:  Clear to auscultation bilateral. Normal respiratory effort Abdomen: positive bowel sounds and no masses, hernias; diffusely non tender to palpation, non distended Neuro/Psych:  Normal mood and affect.  Skin:  Warm and dry.  Lymphatic:  No inguinal lymphadenopathy.   Pelvic exam: is not limited by body habitus EGBUS: within normal limits Vagina: within normal limits and with no blood in the vault, Cervix:  no lesions or cervical  motion tenderness Uterus:  nonenlarged and approximately 8 week sized Adnexa:  normal adnexa and no mass, fullness, tenderness Rectovaginal: deferred   Laboratory: UPT negative  PP Depression Screening:  PHQ9 zero  Assessment: normal vaginal discharge, unprotected sex  Plan:  Pt interested in nexplanon but will bring back in 2 weeks and UPT; pt told to abstain and told about barrier methods nv. Pt would like to get serum and swab testing Told to also expect period sometime in the next few weeks but may not get it back if gets nexplanon Rpt pap in one year F/u BP at nv.   Orders Placed This Encounter  Procedures  . GC/Chlamydia Probe Amp  . RPR  . HIV antibody (with reflex)  . Hepatitis B surface antigen  . Hepatitis C Antibody    RTC 2wks for nexplanon placement  Cornelia Copaharlie Chrisean Kloth, Jr MD Attending Center [facility](Faculty Practice)

## 2015-11-18 LAB — HIV ANTIBODY (ROUTINE TESTING W REFLEX): HIV 1&2 Ab, 4th Generation: NONREACTIVE

## 2015-11-18 LAB — HEPATITIS B SURFACE ANTIGEN: Hepatitis B Surface Ag: NEGATIVE

## 2015-11-18 LAB — HEPATITIS C ANTIBODY: HCV Ab: NEGATIVE

## 2015-11-18 LAB — RPR

## 2015-11-20 LAB — GC/CHLAMYDIA PROBE AMP (~~LOC~~) NOT AT ARMC
Chlamydia: NEGATIVE
Neisseria Gonorrhea: NEGATIVE

## 2015-12-07 ENCOUNTER — Ambulatory Visit: Payer: Managed Care, Other (non HMO) | Admitting: Obstetrics and Gynecology

## 2015-12-09 ENCOUNTER — Ambulatory Visit (HOSPITAL_COMMUNITY)
Admission: EM | Admit: 2015-12-09 | Discharge: 2015-12-09 | Disposition: A | Discharge status: Home / Self Care | Payer: Managed Care, Other (non HMO) | Attending: Internal Medicine | Admitting: Internal Medicine

## 2015-12-09 ENCOUNTER — Encounter (HOSPITAL_COMMUNITY): Payer: Self-pay | Admitting: *Deleted

## 2015-12-09 DIAGNOSIS — B373 Candidiasis of vulva and vagina: Secondary | ICD-10-CM | POA: Diagnosis not present

## 2015-12-09 DIAGNOSIS — Z7722 Contact with and (suspected) exposure to environmental tobacco smoke (acute) (chronic): Secondary | ICD-10-CM | POA: Insufficient documentation

## 2015-12-09 DIAGNOSIS — Z87442 Personal history of urinary calculi: Secondary | ICD-10-CM | POA: Insufficient documentation

## 2015-12-09 DIAGNOSIS — Z79899 Other long term (current) drug therapy: Secondary | ICD-10-CM | POA: Diagnosis not present

## 2015-12-09 DIAGNOSIS — N898 Other specified noninflammatory disorders of vagina: Secondary | ICD-10-CM | POA: Diagnosis present

## 2015-12-09 DIAGNOSIS — B3731 Acute candidiasis of vulva and vagina: Secondary | ICD-10-CM

## 2015-12-09 LAB — POCT URINALYSIS DIP (DEVICE)
Bilirubin Urine: NEGATIVE
Glucose, UA: NEGATIVE mg/dL
Ketones, ur: 15 mg/dL — AB
Nitrite: NEGATIVE
Protein, ur: NEGATIVE mg/dL
Specific Gravity, Urine: 1.025 (ref 1.005–1.030)
Urobilinogen, UA: 0.2 mg/dL (ref 0.0–1.0)
pH: 5 (ref 5.0–8.0)

## 2015-12-09 LAB — POCT PREGNANCY, URINE: Preg Test, Ur: NEGATIVE

## 2015-12-09 MED ORDER — FLUCONAZOLE 150 MG PO TABS: 150.0000 mg | ORAL_TABLET | Freq: Every day | ORAL | 0 refills | Status: DC

## 2015-12-09 NOTE — ED Provider Notes (Signed)
CSN: [id number]     Arrival date & time 12/09/15  1602 History   First MD Initiated Contact with Patient 12/09/15 1810     Chief Complaint  Patient presents with  . Vaginal Itching   (Consider location/radiation/quality/duration/timing/severity/associated sxs/prior Treatment) Madeline Wilkins is a well-appearing 20 y.o female, presents today for vaginal itchiness x 2 days and vaginal discharge x 4 days. She reports the vaginal discharge to be small amount and is white and thin in consistency. She denies dysuria but reports that it tingles to urinate. She is sexually active with 1 partner. She believes she has either yeast or BV. She recently delivered 10 weeks ago and is not breastfeeding. She is requesting for a pregnancy test; reports missing her period for 4 days now.       Past Medical History:  Diagnosis Date  . Gonorrhea   . Infection    UTI  . Kidney stone   . Trichomonal infection   . Urinary tract infection 02/23/2015   Past Surgical History:  Procedure Laterality Date  . INDUCED ABORTION     Family History  Problem Relation Age of Onset  . Cancer Neg Hx   . Diabetes Neg Hx   . Heart disease Neg Hx   . Hypertension Neg Hx   . Stroke Neg Hx   . Hearing loss Neg Hx   . Asthma Neg Hx    Social History  Substance Use Topics  . Smoking status: Passive Smoke Exposure - Never Smoker  . Smokeless tobacco: Never Used  . Alcohol use No   OB History    Gravida Para Term Preterm AB Living   2 1 1   1 1    SAB TAB Ectopic Multiple Live Births     1   0 1     Review of Systems  Constitutional: Negative for diaphoresis, fatigue and fever.  Respiratory: Negative for cough and shortness of breath.   Cardiovascular: Negative for chest pain and palpitations.  Gastrointestinal: Negative for abdominal pain, diarrhea, nausea and vomiting.  Genitourinary: Positive for vaginal discharge. Negative for difficulty urinating, dyspareunia, dysuria, flank pain, hematuria, urgency, vaginal  bleeding and vaginal pain.    Allergies  Review of patient's allergies indicates no known allergies.  Home Medications   Prior to Admission medications   Medication Sig Start Date End Date Taking? Authorizing Provider  fluconazole (DIFLUCAN) 150 MG tablet Take 1 tablet (150 mg total) by mouth daily. 12/09/15   Lucia EstelleFeng Deanda Ruddell, NP  ibuprofen (ADVIL,MOTRIN) 600 MG tablet Take 1 tablet (600 mg total) by mouth every 6 (six) hours. Patient not taking: Reported on 11/17/2015 09/18/15   Adam PhenixJames G Arnold, MD  Prenatal Vit-Fe Fumarate-FA (PRENATAL VITAMINS PLUS) 27-1 MG TABS Take 1 tablet by mouth daily. Patient not taking: Reported on 11/17/2015 03/23/15   Bertram DenverKaren E Teague Clark, PA-C   Meds Ordered and Administered this Visit  Medications - No data to display  BP 124/70 (BP Location: Left Arm)   Pulse 65   Temp 98 F (36.7 C) (Oral)   Resp 18   Ht 5\' 7"  (1.702 m)   Wt 145 lb (65.8 kg)   SpO2 99%   BMI 22.71 kg/m  No data found.   Physical Exam  Constitutional: She is oriented to person, place, and time. She appears well-developed and well-nourished.  Cardiovascular: Normal rate, regular rhythm and normal heart sounds.   Pulmonary/Chest: Effort normal and breath sounds normal.  Abdominal: Soft. Bowel sounds are normal. She exhibits  no distension. There is no tenderness.  No suprapubic tenderness  Genitourinary:  Genitourinary Comments: Labia majora and minora unremarkable with no lesion. Vaginal canal pink moist with no lesion. Small amt of white thick clumpy discharge noted consistent with yeast. -CMT, -adnexal tenderness, -uterine tendnerness  Neurological: She is alert and oriented to person, place, and time.  Skin: Skin is warm and dry.  Nursing note and vitals reviewed.   Urgent Care Course   Clinical Course    Procedures (including critical care time)  Labs Review Labs Reviewed  POCT URINALYSIS DIP (DEVICE) - Abnormal; Notable for the following:       Result Value   Ketones,  ur 15 (*)    Hgb urine dipstick TRACE (*)    Leukocytes, UA SMALL (*)    All other components within normal limits  URINE CULTURE  POCT PREGNANCY, URINE  CERVICOVAGINAL ANCILLARY ONLY    Imaging Review No results found.   MDM   1. Vaginal discharge   2. Yeast vaginitis    Physical examination reveals white thick clumpy discharge most consistent with yeast vaginitis. Will tx with Diflucan 150mg  orally once.   UA does have small amt of Leukocytes and trace amt of hgb, urine culture ordered and is pending, I don't suspect UTI therefore abx not given this visit. I do recommend f/u with PCP in 1-2 weeks for urine recheck due to presence of hgb.   Don't suspect STD as well. Patient informed to call back in 3 days for her cytology result. Patient denies any questions. Discharge paperwork given.     Lucia Estelle, NP 12/09/15 [phone]

## 2015-12-09 NOTE — ED Triage Notes (Signed)
Pt reports  Delivered  Baby    10  Weeks  Ago      Has  Been  Spotting  Recently  Has  Vaginal   Itching      And  White  Discharge     Pt wants a  Pregnancy  Test

## 2015-12-09 NOTE — ED Notes (Signed)
Phone  Number  Verified   

## 2015-12-11 LAB — URINE CULTURE: Culture: >=100000 — AB

## 2015-12-11 LAB — CERVICOVAGINAL ANCILLARY ONLY
Chlamydia: NEGATIVE
Neisseria Gonorrhea: NEGATIVE

## 2015-12-12 LAB — CERVICOVAGINAL ANCILLARY ONLY: Wet Prep (BD Affirm): POSITIVE — AB

## 2015-12-13 ENCOUNTER — Telehealth (HOSPITAL_COMMUNITY): Payer: Self-pay | Admitting: Emergency Medicine

## 2015-12-13 MED ORDER — NITROFURANTOIN MONOHYD MACRO 100 MG PO CAPS: 100.0000 mg | ORAL_CAPSULE | Freq: Two times a day (BID) | ORAL | 0 refills | Status: DC

## 2015-12-13 NOTE — Telephone Encounter (Signed)
Pt called needing lab results from visit on 10/7  Notified her that she was NEG for Chlam/Gon  Pos for BV and UTI (E. Coli)  Pt treated w/Diflucan at visit 10/7.... Per Mosetta PuttFeng, ok to call in Macrobid BID x5 days no refills for UTI  [facility]

## 2016-01-03 ENCOUNTER — Encounter (HOSPITAL_COMMUNITY): Payer: Self-pay | Admitting: Emergency Medicine

## 2016-01-03 ENCOUNTER — Ambulatory Visit (HOSPITAL_COMMUNITY)
Admission: EM | Admit: 2016-01-03 | Discharge: 2016-01-03 | Disposition: A | Discharge status: Home / Self Care | Payer: Managed Care, Other (non HMO) | Attending: Family Medicine | Admitting: Family Medicine

## 2016-01-03 DIAGNOSIS — S61209A Unspecified open wound of unspecified finger without damage to nail, initial encounter: Secondary | ICD-10-CM | POA: Diagnosis not present

## 2016-01-03 NOTE — ED Triage Notes (Signed)
[facility] a complaint of a laceration to the tip of her ring finger on her left hand. The patient advised that she cut her finger using a meat slicer at work. The patient's finger was bandaged and dressed at triage. The patient stated that she had a TDAP last year.

## 2016-01-03 NOTE — ED Provider Notes (Signed)
CSN: [id number]     Arrival date & time 01/03/16  1508 History   First MD Initiated Contact with Patient 01/03/16 1623     Chief Complaint  Patient presents with  . Laceration   (Consider location/radiation/quality/duration/timing/severity/associated sxs/prior Treatment) 20 year old female was at work and using a Administrator, artsmeat slicer. The slicer accidentally produced an avulsion type injury to the tip of the right ring finger. Patient's complaining of local tenderness and pain. There is continuous oozing of venous blood.      Past Medical History:  Diagnosis Date  . Gonorrhea   . Infection    UTI  . Kidney stone   . Trichomonal infection   . Urinary tract infection 02/23/2015   Past Surgical History:  Procedure Laterality Date  . INDUCED ABORTION     Family History  Problem Relation Age of Onset  . Cancer Neg Hx   . Diabetes Neg Hx   . Heart disease Neg Hx   . Hypertension Neg Hx   . Stroke Neg Hx   . Hearing loss Neg Hx   . Asthma Neg Hx    Social History  Substance Use Topics  . Smoking status: Passive Smoke Exposure - Never Smoker  . Smokeless tobacco: Never Used  . Alcohol use No   OB History    Gravida Para Term Preterm AB Living   2 1 1   1 1    SAB TAB Ectopic Multiple Live Births     1   0 1     Review of Systems  Constitutional: Negative.   Respiratory: Negative.   Gastrointestinal: Negative.   Musculoskeletal: Negative.   Skin: Positive for wound.       As per history of present illness  Neurological: Negative.   All other systems reviewed and are negative.   Allergies  Review of patient's allergies indicates no known allergies.  Home Medications   Prior to Admission medications   Medication Sig Start Date End Date Taking? Authorizing Provider  fluconazole (DIFLUCAN) 150 MG tablet Take 1 tablet (150 mg total) by mouth daily. 12/09/15   Lucia EstelleFeng Zheng, NP  nitrofurantoin, macrocrystal-monohydrate, (MACROBID) 100 MG capsule Take 1 capsule (100 mg total)  by mouth 2 (two) times daily. 12/13/15   Lucia EstelleFeng Zheng, NP   Meds Ordered and Administered this Visit  Medications - No data to display  BP 120/77 (BP Location: Left Arm)   Pulse 84   Temp 97.9 F (36.6 C) (Oral)   Resp 18   SpO2 100%   Breastfeeding? No  No data found.   Physical Exam  Constitutional: She is oriented to person, place, and time. She appears well-developed and well-nourished. No distress.  HENT:  Head: Normocephalic and atraumatic.  Neck: Neck supple.  Cardiovascular: Normal rate.   Pulmonary/Chest: Effort normal.  Musculoskeletal: She exhibits no edema or deformity.  4 range of motion of the involved digit. No other injury.  Neurological: She is alert and oriented to person, place, and time.  Skin: Skin is warm and dry.  5 mm x 4 mm ovoid dermal avulsion to the tip of the left ring finger. Does not appear to involve the nail. Does not involve the subcutaneous tissue. No laceration.  Psychiatric: She has a normal mood and affect.  Nursing note and vitals reviewed.   Urgent Care Course   Clinical Course    Procedures (including critical care time)  Labs Review Labs Reviewed - No data to display  Imaging Review No results found.  Visual Acuity Review  Right Eye Distance:   Left Eye Distance:   Bilateral Distance:    Right Eye Near:   Left Eye Near:    Bilateral Near:         MDM   1. Avulsion of skin of finger, initial encounter    Tetanus toxoid updated in less than a year and a half. 2% Xylocaine soaked 4 x 4 placed on the finger for pain control. This was followed by Betadine soak and wash. Surgicel was placed over the avulsion followed by sterile dressing. Keep this dressing on for 24 hours. Then gently rinse the finger and replace a nonadhesive dressing such as a Telfa pad. Wash the finger daily, otherwise keep it clean and dry. Watch for any signs of infection, swelling, redness, red streaks, pus like drainage and seek medical  attention promptly.      Hayden Rasmussenavid Geoff Dacanay, NP 01/03/16 [phone]

## 2016-01-03 NOTE — ED Notes (Signed)
Non   Adherent  Dressing  Given to  Pt

## 2016-01-03 NOTE — Discharge Instructions (Signed)
Keep this dressing on for 24 hours. Then gently rinse the finger and replace a nonadhesive dressing such as a Telfa pad. Wash the finger daily, otherwise keep it clean and dry. Watch for any signs of infection, swelling, redness, red streaks, pus like drainage and seek medical attention promptly.

## 2016-01-15 ENCOUNTER — Encounter: Payer: Self-pay | Admitting: Obstetrics and Gynecology

## 2016-01-15 ENCOUNTER — Ambulatory Visit (INDEPENDENT_AMBULATORY_CARE_PROVIDER_SITE_OTHER): Payer: Managed Care, Other (non HMO) | Admitting: Obstetrics and Gynecology

## 2016-01-15 VITALS — BP 113/71 | HR 92 | Wt 146.7 lb

## 2016-01-15 DIAGNOSIS — Z3201 Encounter for pregnancy test, result positive: Secondary | ICD-10-CM

## 2016-01-15 DIAGNOSIS — Z30017 Encounter for initial prescription of implantable subdermal contraceptive: Secondary | ICD-10-CM

## 2016-01-15 DIAGNOSIS — O3680X0 Pregnancy with inconclusive fetal viability, not applicable or unspecified: Secondary | ICD-10-CM | POA: Insufficient documentation

## 2016-01-15 LAB — POCT PREGNANCY, URINE: Preg Test, Ur: POSITIVE — AB

## 2016-01-15 MED ORDER — ETONOGESTREL 68 MG ~~LOC~~ IMPL
68.0000 mg | DRUG_IMPLANT | Freq: Once | SUBCUTANEOUS | Status: DC
Start: 2016-01-15 — End: 2016-01-15

## 2016-01-15 NOTE — Progress Notes (Addendum)
Obstetrics and Gynecology Visit Return Patient Evaluation  Appointment Date: 01/15/2016  Primary Care Provider: No PCP Per Patient  Chief Complaint: Nexplanon placement  History of Present Illness:  Baxter FlatteryDayjai Towles is a 20 y.o. G2P1011 (LMP: none since delivery). Patient seen at her 6wk PP visit on 9/15 and didn't have LARC placed due to recent sexual intercourse. Patient states she hasn't had sex in the past two weeks but hasn't had a period since her delivery. She is formula feeding. UPT on 10/7 ER visit for vaginitis was negative; she [facility]  Review of Systems: no VB, spotting or nausea/vomiting or abdominal pain  Medications: none Allergies: has No Known Allergies.  Physical Exam:  BP 113/71   Pulse 92   Wt 146 lb 11.2 oz (66.5 kg)   LMP  (LMP Unknown) Comment: spotting off and on  Breastfeeding? No   BMI 22.98 kg/m  Body mass index is 22.98 kg/m. General appearance: Well nourished, well developed female in no acute distress.    Labs: +UPT  Assessment: pregnancy of unknown location; pt stable  Plan: Beta quant today. Will call pt with results and order viability scan prn based on pt's decision below Ectopic precautions given Advised started PNV but pt states she wants to terminate pregnancy Patient told she needs repeat pap smear in December given ascus/hpv pos pap last year; deferred today due to new pregnancy dx.   RTC PRN  Cornelia Copaharlie Jenavee Laguardia, Jr MD Attending Center for [facility](Faculty Practice)

## 2016-01-16 ENCOUNTER — Telehealth: Payer: Self-pay | Admitting: *Deleted

## 2016-01-16 LAB — HCG, QUANTITATIVE, PREGNANCY: hCG, Beta Chain, Quant, S: 41709.6 m[IU]/mL — ABNORMAL HIGH

## 2016-01-16 NOTE — Progress Notes (Signed)
Pt called the front desk requesting results from yesterday.  Looking at pt's chart beta resulted confirming pregnancy.  Pt informed of beta results pt stated that she is not keeping the baby and hung up the phone.

## 2016-01-16 NOTE — Telephone Encounter (Signed)
Pt left message requesting test results from yesterday.

## 2016-01-16 NOTE — Telephone Encounter (Signed)
Madeline NaegeliJeanetta spoke with pt earlier this am.  Note documented in chart.

## 2016-01-23 ENCOUNTER — Telehealth: Payer: Self-pay | Admitting: *Deleted

## 2016-01-23 NOTE — Telephone Encounter (Signed)
Pt left message requesting assistance with reading My Chart.

## 2016-01-24 NOTE — Telephone Encounter (Signed)
Patient has been informed of lab results and has voice understanding at this time

## 2016-03-12 ENCOUNTER — Ambulatory Visit: Payer: Self-pay | Admitting: Obstetrics and Gynecology

## 2016-03-25 ENCOUNTER — Encounter: Payer: Self-pay | Admitting: Obstetrics and Gynecology

## 2016-03-25 ENCOUNTER — Ambulatory Visit (INDEPENDENT_AMBULATORY_CARE_PROVIDER_SITE_OTHER): Payer: Managed Care, Other (non HMO) | Admitting: Obstetrics and Gynecology

## 2016-03-25 VITALS — BP 107/81 | HR 75 | Wt 151.0 lb

## 2016-03-25 DIAGNOSIS — Z3201 Encounter for pregnancy test, result positive: Secondary | ICD-10-CM

## 2016-03-25 LAB — POCT PREGNANCY, URINE: Preg Test, Ur: POSITIVE — AB

## 2016-03-25 NOTE — Progress Notes (Signed)
Obstetrics and Gynecology Visit Established Patient Visit  Appointment Date: 03/25/2016  OBGYN Clinic: [facility] Chief Complaint:  Chief Complaint  Patient presents with  . Contraception    Nexplanon    History of Present Illness: Madeline Wilkins is a 21 y.o. African-American G2P1011 (No LMP recorded.), seen for the above chief complaint. Her past medical history is significant for h/o recent EAB (suction d&c), h/o STIs.  Patient seen mid November for nexplanon but had +UPT and beta. She went to an outside facility and states she had an in office d&c in mid December. No period yet, although she states she has mild cramps that feel like one is about to start. Last intercourse in early January and condom was used. No VB but rarely has some spotting.   Review of Systems: as noted in the History of Present Illness.  Past Medical History:  Past Medical History:  Diagnosis Date  . Gonorrhea   . 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 Infection    UTI  . Kidney stone   . Trichomonal infection   . Urinary tract infection 02/23/2015    Past Surgical History:  Past Surgical History:  Procedure Laterality Date  . INDUCED ABORTION      Past Obstetrical History:  OB History  Gravida Para Term Preterm AB Living  2 1 1   1 1   SAB TAB Ectopic Multiple Live Births    1   0 1    # Outcome Date GA Lbr Len/2nd Weight Sex Delivery Anes PTL Lv  2 Term 09/17/15 [redacted]w[redacted]d 08:09 / 00:04 7 lb 12.5 oz (3.53 kg) F Vag-Spont EPI  LIV  1 TAB  [redacted]w[redacted]d           Obstetric Comments  G2: mid December 2017 in office D&C (elective AB)    Past Gynecological History: As per HPI. 2016 pap: ASCUS/HPV neg 12/2015: negative GC/CT  Social History:  Social History   Social History  . Marital status: Single    Spouse name: N/A  .  Number of children: N/A  . Years of education: N/A   Occupational History  . Not on file.   Social History Main Topics  . Smoking status: Passive Smoke Exposure - Never Smoker  . Smokeless tobacco: Never Used  . Alcohol use No  . Drug use: No  . Sexual activity: Yes    Birth control/ protection: None   Other Topics Concern  . Not on file   Social History Narrative  . No narrative on file    Family History:  Family History  Problem Relation Age of Onset  . Cancer Neg Hx   . Diabetes Neg Hx   . Heart disease Neg Hx   . Hypertension Neg Hx   . Stroke Neg Hx   . Hearing loss Neg Hx   . Asthma Neg Hx    Medications None  Allergies Patient has no known allergies.   Physical Exam:  BP 107/81   Pulse 75   Wt 151 lb (68.5 kg)   Breastfeeding? No   BMI 23.65 kg/m  Body mass index is 23.65 kg/m. General appearance: Well nourished, well developed female in no acute distress.  Abdomen: NTTP  Laboratory:  ?faintly + UPT  Radiology: none  Assessment: ?pregnancy of unknown  location  Plan:  Beta hcg today. Will call with results tomorrow. Pt would like to wait until results back before r/s Nexplanon placement  RTC based on lab from today  Cornelia Copaharlie Nadiya Pieratt, Jr MD Attending Center for [facility](Faculty Practice)

## 2016-03-26 ENCOUNTER — Telehealth: Payer: Self-pay | Admitting: *Deleted

## 2016-03-26 ENCOUNTER — Encounter: Payer: Self-pay | Admitting: Obstetrics and Gynecology

## 2016-03-26 LAB — HCG, QUANTITATIVE, PREGNANCY: hCG, Beta Chain, Quant, S: 27.4 m[IU]/mL — ABNORMAL HIGH

## 2016-03-26 NOTE — Telephone Encounter (Addendum)
Per Dr. Vergie LivingPickens, pt needs to be informed that her BHCG is likely positive due to her past resolving pregnancy. He recommends that she have repeat BHCG (not stat) on 1/24 in the early to mid afternoon.  I called pt to discuss lab results from yesterday and there was no answer.   1455 - Called and spoke with pt.  I informed her of test results and recommendation from Dr. Vergie LivingPickens. She will be working tomorrow and agrees to lab appt on 1/25 @ 1500.  Pt had no additional questions.

## 2016-03-27 ENCOUNTER — Other Ambulatory Visit: Payer: Self-pay

## 2016-03-28 ENCOUNTER — Other Ambulatory Visit: Payer: Managed Care, Other (non HMO) | Admitting: *Deleted

## 2016-03-28 DIAGNOSIS — O3680X0 Pregnancy with inconclusive fetal viability, not applicable or unspecified: Secondary | ICD-10-CM

## 2016-03-29 LAB — HCG, QUANTITATIVE, PREGNANCY: hCG, Beta Chain, Quant, S: 24.9 m[IU]/mL — ABNORMAL HIGH

## 2016-03-29 NOTE — Progress Notes (Addendum)
error 

## 2016-04-04 ENCOUNTER — Other Ambulatory Visit: Payer: Self-pay

## 2016-04-10 ENCOUNTER — Other Ambulatory Visit: Payer: Managed Care, Other (non HMO)

## 2016-04-10 DIAGNOSIS — O3680X0 Pregnancy with inconclusive fetal viability, not applicable or unspecified: Secondary | ICD-10-CM

## 2016-04-11 ENCOUNTER — Telehealth: Payer: Self-pay | Admitting: General Practice

## 2016-04-11 LAB — HCG, QUANTITATIVE, PREGNANCY: hCG, Beta Chain, Quant, S: 9.2 m[IU]/mL — ABNORMAL HIGH

## 2016-04-11 NOTE — Telephone Encounter (Signed)
Per Dr Vergie LivingPickens, patient needs follow up bhcg in 7-10 days. Called patient, no answer- left message that we are trying to reach you with results, please check your mychart account. Will send message.

## 2016-04-19 ENCOUNTER — Other Ambulatory Visit: Payer: Self-pay

## 2016-04-24 ENCOUNTER — Other Ambulatory Visit: Payer: Managed Care, Other (non HMO)

## 2016-04-24 DIAGNOSIS — O3680X0 Pregnancy with inconclusive fetal viability, not applicable or unspecified: Secondary | ICD-10-CM

## 2016-04-25 LAB — BETA HCG QUANT (REF LAB): hCG Quant: 4 m[IU]/mL

## 2016-04-26 ENCOUNTER — Telehealth: Payer: Self-pay | Admitting: *Deleted

## 2016-04-26 NOTE — Telephone Encounter (Signed)
Called Madeline Wilkins and notified her of results. She is still interested in getting the Nexplanon. Advised her to refrain from intercourse until she comes in and I would have the front office call her to schedule asap. Patient voiced understanding.

## 2016-04-26 NOTE — Telephone Encounter (Signed)
-----   Message from [place] Bingharlie Pickens, MD sent at 04/26/2016  7:52 AM EST ----- Can you let her know that her beta level is now negative and if she still wants to schedule nexplanon placement. thanks

## 2016-04-29 ENCOUNTER — Ambulatory Visit: Payer: Self-pay | Admitting: Obstetrics and Gynecology

## 2016-05-10 ENCOUNTER — Ambulatory Visit (INDEPENDENT_AMBULATORY_CARE_PROVIDER_SITE_OTHER): Payer: Managed Care, Other (non HMO) | Admitting: Obstetrics and Gynecology

## 2016-05-10 ENCOUNTER — Encounter: Payer: Self-pay | Admitting: Obstetrics and Gynecology

## 2016-05-10 VITALS — BP 119/84 | HR 83 | Wt 154.9 lb

## 2016-05-10 DIAGNOSIS — Z3049 Encounter for surveillance of other contraceptives: Secondary | ICD-10-CM

## 2016-05-10 DIAGNOSIS — Z30017 Encounter for initial prescription of implantable subdermal contraceptive: Secondary | ICD-10-CM

## 2016-05-10 DIAGNOSIS — Z3202 Encounter for pregnancy test, result negative: Secondary | ICD-10-CM | POA: Diagnosis not present

## 2016-05-10 LAB — POCT PREGNANCY, URINE: Preg Test, Ur: NEGATIVE

## 2016-05-10 MED ORDER — ETONOGESTREL 68 MG ~~LOC~~ IMPL
68.0000 mg | DRUG_IMPLANT | Freq: Once | SUBCUTANEOUS | Status: AC
  Administered 2016-05-10: 68 mg via SUBCUTANEOUS

## 2016-05-10 NOTE — Addendum Note (Signed)
Addended by: Garret ReddishBARNES, Zeddie Njie M on: 05/10/2016 10:12 AM   Modules accepted: Orders

## 2016-05-10 NOTE — Procedures (Signed)
Nexplanon Insertion Procedure Note Prior to the procedure being performed, the patient (or guardian) was asked to state their full name, date of birth, type of procedure being performed and the exact location of the operative site. This information was then checked against the documentation in the patient's chart. Prior to the procedure being performed, a "time out" was performed by the physician that confirmed the correct patient, procedure and site.  After informed consent was obtained, the patient's non-dominant left arm was chosen for insertion. A site was marked approximately 8 cm proximal to the medial epicondyle in the sulcus between the biceps and triceps on the inner surface. The area was cleaned with alcohol then local anesthesia was infiltrated with 3 ml of 1% lidocaine with epi along the planned insertion track. The area was prepped with betadine. Using sterile technique the Nexplanon device was inserted per manufacturer's guidelines in the subdermal connective tissue using the standard insertion technique without difficulty. Pressure was applied and the insertion site was hemostatic. The presence of the Nexplanon was confirmed immediately after insertion by palpation by both me and the patient and by checking the tip of needle for the absence of the insert.  A pressure dressing was applied.  Lot #: [id number] Exp: 07/2018  The patient tolerated the procedure well.  Cornelia Copaharlie Luva Metzger, Jr MD Attending [facility](Faculty Practice)

## 2016-05-25 ENCOUNTER — Ambulatory Visit (HOSPITAL_COMMUNITY)
Admission: EM | Admit: 2016-05-25 | Discharge: 2016-05-25 | Disposition: A | Discharge status: Home / Self Care | Payer: Managed Care, Other (non HMO) | Attending: Internal Medicine | Admitting: Internal Medicine

## 2016-05-25 ENCOUNTER — Encounter (HOSPITAL_COMMUNITY): Payer: Self-pay | Admitting: *Deleted

## 2016-05-25 DIAGNOSIS — Z7722 Contact with and (suspected) exposure to environmental tobacco smoke (acute) (chronic): Secondary | ICD-10-CM | POA: Insufficient documentation

## 2016-05-25 DIAGNOSIS — B9689 Other specified bacterial agents as the cause of diseases classified elsewhere: Secondary | ICD-10-CM | POA: Insufficient documentation

## 2016-05-25 DIAGNOSIS — N760 Acute vaginitis: Secondary | ICD-10-CM | POA: Diagnosis not present

## 2016-05-25 DIAGNOSIS — N898 Other specified noninflammatory disorders of vagina: Secondary | ICD-10-CM | POA: Diagnosis not present

## 2016-05-25 LAB — POCT URINALYSIS DIP (DEVICE)
Bilirubin Urine: NEGATIVE
Glucose, UA: NEGATIVE mg/dL
Hgb urine dipstick: NEGATIVE
Ketones, ur: NEGATIVE mg/dL
Nitrite: NEGATIVE
Protein, ur: NEGATIVE mg/dL
Specific Gravity, Urine: 1.025 (ref 1.005–1.030)
Urobilinogen, UA: 0.2 mg/dL (ref 0.0–1.0)
pH: 5.5 (ref 5.0–8.0)

## 2016-05-25 LAB — POCT PREGNANCY, URINE: Preg Test, Ur: NEGATIVE

## 2016-05-25 MED ORDER — METRONIDAZOLE 500 MG PO TABS: 500.0000 mg | ORAL_TABLET | Freq: Two times a day (BID) | ORAL | 0 refills | Status: DC

## 2016-05-25 NOTE — ED Provider Notes (Signed)
CSN: [id number]     Arrival date & time 05/25/16  1238 History   First MD Initiated Contact with Patient 05/25/16 1345     Chief Complaint  Patient presents with  . Vaginal Discharge   (Consider location/radiation/quality/duration/timing/severity/associated sxs/prior Treatment) 21 year old female complaining of a vaginal discharge for 2 weeks. Denies pelvic pain. Denies other associated symptoms.      Past Medical History:  Diagnosis Date  . Gonorrhea   . 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 Infection    UTI  . Kidney stone   . Trichomonal infection   . Urinary tract infection 02/23/2015   Past Surgical History:  Procedure Laterality Date  . INDUCED ABORTION     Family History  Problem Relation Age of Onset  . Cancer Neg Hx   . Diabetes Neg Hx   . Heart disease Neg Hx   . Hypertension Neg Hx   . Stroke Neg Hx   . Hearing loss Neg Hx   . Asthma Neg Hx    Social History  Substance Use Topics  . Smoking status: Passive Smoke Exposure - Never Smoker  . Smokeless tobacco: Never Used  . Alcohol use No   OB History    Gravida Para Term Preterm AB Living   2 1 1   1 1    SAB TAB Ectopic Multiple Live Births     1   0 1      Obstetric Comments   G2: mid December 2017 in office D&C (elective AB)     Review of Systems  Constitutional: Negative.   Gastrointestinal: Negative.   Genitourinary: Positive for vaginal discharge. Negative for dysuria, flank pain, frequency, hematuria, menstrual problem and pelvic pain.  Musculoskeletal: Negative.   All other systems reviewed and are negative.   Allergies  Patient has no known allergies.  Home Medications   Prior to Admission medications   Medication Sig Start Date End Date Taking? Authorizing Provider  metroNIDAZOLE (FLAGYL) 500 MG tablet Take 1 tablet  (500 mg total) by mouth 2 (two) times daily. X 7 days 05/25/16   Hayden Rasmussen, NP   Meds Ordered and Administered this Visit  Medications - No data to display  BP 110/80 (BP Location: Right Arm)   Pulse 78   Temp 98.6 F (37 C) (Oral)   Resp 18   LMP 05/05/2016 (Exact Date)   SpO2 98%  No data found.   Physical Exam  Constitutional: She is oriented to person, place, and time. She appears well-developed and well-nourished. No distress.  Neck: Neck supple.  Cardiovascular: Normal rate.   Pulmonary/Chest: Effort normal.  Genitourinary:  Genitourinary Comments: Normal external female genitalia. There is a small amount of thin gray discharge in the introitus. There is a thick creamy, beige colored discharge and covering the cervix and vaginal walls. Cervix in midline, pink, no lesions. No CMT or adnexal tenderness.  Patrecia Pace RN present during exam  Neurological: She is alert and oriented to person, place, and time.  Skin: Skin is warm and dry.  Nursing note and vitals reviewed.   Urgent Care Course     Procedures (including critical care time)  Labs Review Labs Reviewed  POCT URINALYSIS DIP (DEVICE) - Abnormal; Notable for the following:       Result Value  Leukocytes, UA SMALL (*)    All other components within normal limits  HIV ANTIBODY (ROUTINE TESTING)  POCT PREGNANCY, URINE  CERVICOVAGINAL ANCILLARY ONLY   Results for orders placed or performed during the hospital encounter of 05/25/16  POCT urinalysis dip (device)  Result Value Ref Range   Glucose, UA NEGATIVE NEGATIVE mg/dL   Bilirubin Urine NEGATIVE NEGATIVE   Ketones, ur NEGATIVE NEGATIVE mg/dL   Specific Gravity, Urine 1.025 1.005 - 1.030   Hgb urine dipstick NEGATIVE NEGATIVE   pH 5.5 5.0 - 8.0   Protein, ur NEGATIVE NEGATIVE mg/dL   Urobilinogen, UA 0.2 0.0 - 1.0 mg/dL   Nitrite NEGATIVE NEGATIVE   Leukocytes, UA SMALL (A) NEGATIVE  Pregnancy, urine POC  Result Value Ref Range   Preg Test, Ur  NEGATIVE NEGATIVE     Imaging Review No results found.   Visual Acuity Review  Right Eye Distance:   Left Eye Distance:   Bilateral Distance:    Right Eye Near:   Left Eye Near:    Bilateral Near:         MDM   1. BV (bacterial vaginosis)   2. Vaginal discharge   3. Acute vaginitis    Take medication as directed. If any of the other test come back positive any need treatment we will call and let you know. Meds ordered this encounter  Medications  . metroNIDAZOLE (FLAGYL) 500 MG tablet    Sig: Take 1 tablet (500 mg total) by mouth 2 (two) times daily. X 7 days    Dispense:  14 tablet    Refill:  0    Order Specific Question:   Supervising Provider    Answer:   Eustace MooreMURRAY, LAURA W [[id number]]   cyto and HIV pending    Hayden Rasmussenavid Lopaka Karge, NP 05/25/16 1416

## 2016-05-25 NOTE — Discharge Instructions (Signed)
Take medication as directed. If any of the other test come back positive any need treatment we will call and let you know.

## 2016-05-25 NOTE — ED Triage Notes (Signed)
Pt  Reports  A  vagiinal  Discharge   For  Several   Weeks          denys  Any  Sores      She  Ambulated  To room  With     Steady     Fluid   Gait        denys  Any  Sores

## 2016-05-25 NOTE — ED Notes (Signed)
Call back number verified and updated in EPIC... Adv pt to not have SI until lab results comeback neg.... Also adv pt lab results will be on MyChart; instructions given .... Pt verb understanding.   

## 2016-05-26 LAB — HIV ANTIBODY (ROUTINE TESTING W REFLEX): HIV Screen 4th Generation wRfx: NONREACTIVE

## 2016-05-27 LAB — CERVICOVAGINAL ANCILLARY ONLY
Chlamydia: NEGATIVE
Neisseria Gonorrhea: NEGATIVE
Wet Prep (BD Affirm): POSITIVE — AB

## 2016-09-26 ENCOUNTER — Ambulatory Visit (HOSPITAL_COMMUNITY)
Admission: EM | Admit: 2016-09-26 | Discharge: 2016-09-26 | Disposition: A | Discharge status: Home / Self Care | Payer: Managed Care, Other (non HMO) | Attending: Emergency Medicine | Admitting: Emergency Medicine

## 2016-09-26 ENCOUNTER — Encounter (HOSPITAL_COMMUNITY): Payer: Self-pay

## 2016-09-26 DIAGNOSIS — Z975 Presence of (intrauterine) contraceptive device: Secondary | ICD-10-CM

## 2016-09-26 DIAGNOSIS — N898 Other specified noninflammatory disorders of vagina: Secondary | ICD-10-CM | POA: Diagnosis not present

## 2016-09-26 LAB — RAPID HIV SCREEN (HIV 1/2 AB+AG)
HIV 1/2 Antibodies: NONREACTIVE
HIV-1 P24 Antigen - HIV24: NONREACTIVE

## 2016-09-26 MED ORDER — CEFTRIAXONE SODIUM 250 MG IJ SOLR
INTRAMUSCULAR | Status: AC
  Filled 2016-09-26: qty 250

## 2016-09-26 MED ORDER — METRONIDAZOLE 500 MG PO TABS: 500.0000 mg | ORAL_TABLET | Freq: Two times a day (BID) | ORAL | 0 refills | Status: DC

## 2016-09-26 MED ORDER — AZITHROMYCIN 250 MG PO TABS
1000.0000 mg | ORAL_TABLET | Freq: Once | ORAL | Status: AC
  Administered 2016-09-26: 1000 mg via ORAL

## 2016-09-26 MED ORDER — LIDOCAINE HCL (PF) 1 % IJ SOLN
INTRAMUSCULAR | Status: AC
  Filled 2016-09-26: qty 2

## 2016-09-26 MED ORDER — AZITHROMYCIN 250 MG PO TABS
ORAL_TABLET | ORAL | Status: AC
  Filled 2016-09-26: qty 4

## 2016-09-26 MED ORDER — CEFTRIAXONE SODIUM 250 MG IJ SOLR
250.0000 mg | Freq: Once | INTRAMUSCULAR | Status: AC
  Administered 2016-09-26: 250 mg via INTRAMUSCULAR

## 2016-09-26 NOTE — ED Provider Notes (Signed)
CSN: [id number]     Arrival date & time 09/26/16  1208 History   None    Chief Complaint  Patient presents with  . Vaginal Discharge   (Consider location/radiation/quality/duration/timing/severity/associated sxs/prior Treatment) History of Present Illness  Patient Identification Madeline Wilkins is a 21 y.o. female.  Patient information was obtained from patient. History/Exam limitations: none. Patient presented to the UC by private vehicle.  Chief Complaint  Vaginal Discharge   The patient complains vaginal discharge white, thick and odorless. Onset of symptoms was gradual starting 1 weeks ago. Severity of symptoms moderate. Symptoms occur at rest, during/after urination and not pregnant (Pt has Nexplenon Implant) Symptoms have been constant. Symptoms are aggravated by urination, alleviated by nothing and no associated symptoms. Other modifying factors include none. Previous evaluation includes treated for BV.  Past Medical History: No date: Gonorrhea 02/23/2015: HIV exposure     Comment:  Patient will need to be screened every 3 months 02/2015               nonreactive; exposed in Summer 2016, former partner               incarcerated March 2017 (x) NR July 2017 (x) NR   Sept               2017 (x) NR No date: Infection     Comment:  UTI No date: Kidney stone No date: Trichomonal infection 02/23/2015: Urinary tract infection Review of patient's family history indicates: Problem: Cancer     Relation: Neg Hx      Age of Onset: (Not Specified)  Problem: Diabetes     Relation: Neg Hx      Age of Onset: (Not Specified)  Problem: Heart disease     Relation: Neg Hx      Age of Onset: (Not Specified)  Problem: Hypertension     Relation: Neg Hx      Age of Onset: (Not Specified)  Problem: Stroke     Relation: Neg Hx      Age of Onset: (Not Specified)  Problem: Hearing loss     Relation: Neg Hx      Age of Onset: (Not Specified)  Problem: Asthma     Relation: Neg Hx       Age of Onset: (Not Specified)   Scheduled Meds:azithromycin, 1,000 mg, Oral, Once cefTRIAXone, 250 mg, Intramuscular, Once   No Known Allergies Social History   Marital status: Single              Spouse name:                      Years of education:                 Number of children:             Occupational History   None on file  Social History Main Topics   Smoking status: Never Smoker                                                              Smokeless tobacco: Never Used                       Alcohol use:  No             Drug use: No             Sexual activity: Yes                    Birth control/protection: Nexplenon implant  Review of Systems Pertinent items noted in HPI and remainder of comprehensive ROS otherwise negative.   Physical Exam  BP 123/85 (BP Location: Right Arm)   Pulse 77   Temp 98.8 F (37.1 C) (Oral)   Resp 17   Ht 5\' 7"  (1.702 m)   Wt 150 lb (68 kg)   SpO2 99%   BMI 23.49 kg/m  General:   alert, no signs of distress, non-diaphoretic Heart: regular rate and rhythm, S1, S2 normal, no murmur, click, rub or gallop Lungs: clear to auscultation bilaterally Abdomen: soft, non-tender, without masses or organomegaly GU: Deferred, urine cytology will be obtained        Past Medical History:  Diagnosis Date  . Gonorrhea   . 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 Infection    UTI  . Kidney stone   . Trichomonal infection   . Urinary tract infection 02/23/2015   Past Surgical History:  Procedure Laterality Date  . INDUCED ABORTION     Family History  Problem Relation Age of Onset  . Cancer Neg Hx   . Diabetes Neg Hx   . Heart disease Neg Hx   . Hypertension Neg Hx   . Stroke Neg Hx   . Hearing loss Neg Hx   . Asthma Neg Hx    Social History  Substance Use Topics   . Smoking status: Never Smoker  . Smokeless tobacco: Never Used  . Alcohol use No   OB History    Gravida Para Term Preterm AB Living   2 1 1   1 1    SAB TAB Ectopic Multiple Live Births     1   0 1      Obstetric Comments   G2: mid December 2017 in office D&C (elective AB)     Review of Systems  Allergies  Patient has no known allergies.  Home Medications   Prior to Admission medications   Medication Sig Start Date End Date Taking? Authorizing Provider  metroNIDAZOLE (FLAGYL) 500 MG tablet Take 1 tablet (500 mg total) by mouth 2 (two) times daily. 09/26/16   Dorena Bodo, NP   Meds Ordered and Administered this Visit   Medications  azithromycin [facility]) tablet 1,000 mg (1,000 mg Oral Given 09/26/16 1245)  cefTRIAXone (ROCEPHIN) injection 250 mg (250 mg Intramuscular Given 09/26/16 1245)    BP 123/85 (BP Location: Right Arm)   Pulse 77   Temp 98.8 F (37.1 C) (Oral)   Resp 17   Ht 5\' 7"  (1.702 m)   Wt 150 lb (68 kg)   SpO2 99%   BMI 23.49 kg/m  No data found.   Physical Exam  Urgent Care Course     Procedures (including critical care time)  Labs Review Labs Reviewed  RAPID HIV SCREEN (HIV 1/2 AB+AG)  RPR  URINE CYTOLOGY ANCILLARY ONLY    Imaging Review No results found.   MDM   1. Vaginal discharge      UC Course: Urine  Cytology will be obtained along with  Records Reviewed: Old medical records. Treatments: Treating with Rocephin and ceftriaxone in clinic. Discharging home with Rx of metronidazole Disposition: Home: RX for metronidazole, will notify of results of cytology if there are positive findings     Dorena BodoKennard, Marquinn Meschke, NP 09/26/16 1331

## 2016-09-26 NOTE — Discharge Instructions (Signed)
To treat the bacteria that commonly calls bacterial vaginosis or vaginitis, I have prescribed metronidazole. Take 1 tablet twice a day for 7 days. Do not drink any alcohol while taking this medicine as it can make you very ill. . Also, your urine is being tested for gonorrhea, chlamydia, BV, Trichomonas, and yeast, if there are any positive findings, you will be contacted in 3-5 business days.  I also recommend safe sex practices such as abstinence or the use of barrier methods such as condoms while you are being treated. If your symptoms persist, follow up with your primary care provider, gynecologist, the health department, or return to clinic.

## 2016-09-26 NOTE — ED Triage Notes (Signed)
Pt. Here for white thick vaginal discharge x1 week. Pt. Also reports unprotected sex with boyfriend and wants to get tested for STI. Pt. Hx of bacterial vaginosis and thinks this is similar. Pt. Denies any itching or burning. Pt. Also denies urinary symptoms.

## 2016-09-27 LAB — URINE CYTOLOGY ANCILLARY ONLY
Chlamydia: NEGATIVE
Neisseria Gonorrhea: NEGATIVE
Trichomonas: NEGATIVE

## 2016-09-27 LAB — RPR: RPR Ser Ql: NONREACTIVE

## 2016-10-01 LAB — URINE CYTOLOGY ANCILLARY ONLY: Candida vaginitis: NEGATIVE

## 2017-03-05 ENCOUNTER — Ambulatory Visit (HOSPITAL_COMMUNITY)
Admission: EM | Admit: 2017-03-05 | Discharge: 2017-03-05 | Disposition: A | Discharge status: Home / Self Care | Payer: Managed Care, Other (non HMO) | Attending: Emergency Medicine | Admitting: Emergency Medicine

## 2017-03-05 ENCOUNTER — Encounter (HOSPITAL_COMMUNITY): Payer: Self-pay | Admitting: Family Medicine

## 2017-03-05 DIAGNOSIS — Z3202 Encounter for pregnancy test, result negative: Secondary | ICD-10-CM

## 2017-03-05 DIAGNOSIS — Z87442 Personal history of urinary calculi: Secondary | ICD-10-CM | POA: Insufficient documentation

## 2017-03-05 DIAGNOSIS — B9689 Other specified bacterial agents as the cause of diseases classified elsewhere: Secondary | ICD-10-CM

## 2017-03-05 DIAGNOSIS — Z202 Contact with and (suspected) exposure to infections with a predominantly sexual mode of transmission: Secondary | ICD-10-CM | POA: Insufficient documentation

## 2017-03-05 DIAGNOSIS — Z206 Contact with and (suspected) exposure to human immunodeficiency virus [HIV]: Secondary | ICD-10-CM | POA: Diagnosis not present

## 2017-03-05 DIAGNOSIS — N760 Acute vaginitis: Secondary | ICD-10-CM | POA: Diagnosis not present

## 2017-03-05 DIAGNOSIS — Z113 Encounter for screening for infections with a predominantly sexual mode of transmission: Secondary | ICD-10-CM

## 2017-03-05 DIAGNOSIS — A549 Gonococcal infection, unspecified: Secondary | ICD-10-CM | POA: Insufficient documentation

## 2017-03-05 DIAGNOSIS — N898 Other specified noninflammatory disorders of vagina: Secondary | ICD-10-CM | POA: Diagnosis present

## 2017-03-05 DIAGNOSIS — N3001 Acute cystitis with hematuria: Secondary | ICD-10-CM

## 2017-03-05 LAB — POCT URINALYSIS DIP (DEVICE)
Bilirubin Urine: NEGATIVE
Glucose, UA: NEGATIVE mg/dL
Ketones, ur: NEGATIVE mg/dL
Nitrite: POSITIVE — AB
Protein, ur: 30 mg/dL — AB
Specific Gravity, Urine: >=1.03 (ref 1.005–1.030)
Urobilinogen, UA: 0.2 mg/dL (ref 0.0–1.0)
pH: 5.5 (ref 5.0–8.0)

## 2017-03-05 LAB — POCT PREGNANCY, URINE: Preg Test, Ur: NEGATIVE

## 2017-03-05 MED ORDER — CEFTRIAXONE SODIUM 250 MG IJ SOLR
250.0000 mg | Freq: Once | INTRAMUSCULAR | Status: AC
  Administered 2017-03-05: 250 mg via INTRAMUSCULAR

## 2017-03-05 MED ORDER — METRONIDAZOLE 500 MG PO TABS: 500.0000 mg | ORAL_TABLET | Freq: Two times a day (BID) | ORAL | 0 refills | Status: AC

## 2017-03-05 MED ORDER — NITROFURANTOIN MONOHYD MACRO 100 MG PO CAPS: 100.0000 mg | ORAL_CAPSULE | Freq: Two times a day (BID) | ORAL | 0 refills | Status: DC

## 2017-03-05 MED ORDER — AZITHROMYCIN 250 MG PO TABS
ORAL_TABLET | ORAL | Status: AC
  Filled 2017-03-05: qty 4

## 2017-03-05 MED ORDER — CEFTRIAXONE SODIUM 250 MG IJ SOLR
INTRAMUSCULAR | Status: AC
  Filled 2017-03-05: qty 250

## 2017-03-05 MED ORDER — AZITHROMYCIN 250 MG PO TABS
1000.0000 mg | ORAL_TABLET | Freq: Once | ORAL | Status: AC
  Administered 2017-03-05: 1000 mg via ORAL

## 2017-03-05 MED ORDER — STERILE WATER FOR INJECTION IJ SOLN
INTRAMUSCULAR | Status: AC
  Filled 2017-03-05: qty 10

## 2017-03-05 NOTE — Discharge Instructions (Signed)
Take the medication as written. Give us a working phone number so that we can contact you if needed. Refrain from sexual contact until you know your results and your partner(s) are treated if necessary. Return to the ER if you get worse, have a fever >100.4, or for any concerns.  ° °Go to www.goodrx.com to look up your medications. This will give you a list of where you can find your prescriptions at the most affordable prices. Or ask the pharmacist what the cash price is, or if they have any other discount programs available to help make your medication more affordable. This can be less expensive than what you would pay with insurance.  ° °

## 2017-03-05 NOTE — ED Provider Notes (Signed)
HPI  SUBJECTIVE:  Madeline Wilkins is a 22 y.o. female who presents with 1 week of slightly odorous vaginal discharge.  She is requesting STD testing.  There are no aggravating or alleviating factors.  She has not tried anything for this.  She denies fevers, abdominal pain, back pain, pelvic pain.  No vaginal itching, rash, odor.  No dysuria, urgency, frequency, cloudy odorous urine, hematuria.  States that it hurts when she holds her urine.  She is sexually active for the same female partner but states that it is not on and off relationship.  She states that her partner was reporting "not feeling right" but she is unable to further clarify this.  Her partner is HIV negative.  She has been using a new soap.  No perfumed body washes or recent antibiotics.  She has a past medical history of gonorrhea, trichomonas, BV, yeast.  No history of chlamydia, HIV, HSV, syphilis, diabetes, hypertension, PID, ectopic pregnancy.  LMP: One month ago.  Denies the possibility of being pregnant.  PMD: None   Past Medical History:  Diagnosis Date  . Gonorrhea   . 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 Infection    UTI  . Kidney stone   . Trichomonal infection   . Urinary tract infection 02/23/2015    Past Surgical History:  Procedure Laterality Date  . INDUCED ABORTION      Family History  Problem Relation Age of Onset  . Cancer Neg Hx   . Diabetes Neg Hx   . Heart disease Neg Hx   . Hypertension Neg Hx   . Stroke Neg Hx   . Hearing loss Neg Hx   . Asthma Neg Hx     Social History   Tobacco Use  . Smoking status: Never Smoker  . Smokeless tobacco: Never Used  Substance Use Topics  . Alcohol use: No  . Drug use: No     Current Facility-Administered Medications:  .  azithromycin (ZITHROMAX) tablet 1,000 mg, 1,000 mg, Oral, Once,  Domenick Gong, MD .  cefTRIAXone (ROCEPHIN) injection 250 mg, 250 mg, Intramuscular, Once, Domenick Gong, MD  Current Outpatient Medications:  .  metroNIDAZOLE (FLAGYL) 500 MG tablet, Take 1 tablet (500 mg total) by mouth 2 (two) times daily for 7 days., Disp: 14 tablet, Rfl: 0 .  nitrofurantoin, macrocrystal-monohydrate, (MACROBID) 100 MG capsule, Take 1 capsule (100 mg total) by mouth 2 (two) times daily., Disp: 14 capsule, Rfl: 0  No Known Allergies   ROS  As noted in HPI.   Physical Exam  BP 128/85   Pulse 77   Temp 98 F (36.7 C)   Resp 18   SpO2 100%   Constitutional: Well developed, well nourished, no acute distress Eyes:  EOMI, conjunctiva normal bilaterally HENT: Normocephalic, atraumatic,mucus membranes moist Respiratory: Normal inspiratory effort Cardiovascular: Normal rate GI: nondistended soft, nontender. No suprapubic tenderness  back: No CVA tenderness GU: External genitalia normal. Normal vaginal mucosa.  Normal os. Thin oderous  White vaginal discharge.   Uterus smooth, NT. No CMT. No adnexal tenderness. No adnexal masses.  Chaperone present during exam skin: No rash, skin intact Musculoskeletal: no deformities Neurologic: Alert & oriented x 3, no focal neuro deficits Psychiatric: Speech and behavior appropriate   ED Course   Medications  cefTRIAXone (ROCEPHIN) injection 250  mg (not administered)  azithromycin (ZITHROMAX) tablet 1,000 mg (not administered)    Orders Placed This Encounter  Procedures  . Pelvic exam    Standing Status:   Standing    Number of Occurrences:   1  . Urine culture    Standing Status:   Standing    Number of Occurrences:   1    Order Specific Question:   List patient's active antibiotics    Answer:   macrobid    Order Specific Question:   Patient immune status    Answer:   Normal  . HIV antibody    Standing Status:   Standing    Number of Occurrences:   1  . RPR    Standing Status:   Standing    Number of  Occurrences:   1  . POCT urinalysis dip (device)    Standing Status:   Standing    Number of Occurrences:   1  . Pregnancy, urine POC    Standing Status:   Standing    Number of Occurrences:   1    Results for orders placed or performed during the hospital encounter of 03/05/17 (from the past 24 hour(s))  POCT urinalysis dip (device)     Status: Abnormal   Collection Time: 03/05/17  4:51 PM  Result Value Ref Range   Glucose, UA NEGATIVE NEGATIVE mg/dL   Bilirubin Urine NEGATIVE NEGATIVE   Ketones, ur NEGATIVE NEGATIVE mg/dL   Specific Gravity, Urine >=1.030 1.005 - 1.030   Hgb urine dipstick MODERATE (A) NEGATIVE   pH 5.5 5.0 - 8.0   Protein, ur 30 (A) NEGATIVE mg/dL   Urobilinogen, UA 0.2 0.0 - 1.0 mg/dL   Nitrite POSITIVE (A) NEGATIVE   Leukocytes, UA SMALL (A) NEGATIVE  Pregnancy, urine POC     Status: None   Collection Time: 03/05/17  4:53 PM  Result Value Ref Range   Preg Test, Ur NEGATIVE NEGATIVE   No results found.  ED Clinical Impression  Bacterial vaginosis  Screen for STD (sexually transmitted disease)  Acute cystitis with hematuria   ED Assessment/Plan  Urine pregnancy negative.  Urine dip consistent with a UTI with positive nitrite and esterase.  Moderate hematuria.    H&P most c/w UTI.  Also suspect BV. Sent off GC/chlamydia, wet prep, HIV, RPR. Will  treat empirically now. Giving ceftriaxone 250 mg IM, azithro 1 gm po. Will send home with flagyl and abx for uti. Advised pt to refrain from sexual contact until she knows lab results, symptoms resolve, and partner(s) are treated if necessary. Pt provided working phone number. Follow-up with PMD of choice as needed.  Will give patient primary care referral list. discussed labs, MDM, plan and followup with patient. Pt agrees with plan.   Meds ordered this encounter  Medications  . cefTRIAXone (ROCEPHIN) injection 250 mg  . azithromycin (ZITHROMAX) tablet 1,000 mg  . nitrofurantoin, macrocrystal-monohydrate,  (MACROBID) 100 MG capsule    Sig: Take 1 capsule (100 mg total) by mouth 2 (two) times daily.    Dispense:  14 capsule    Refill:  0  . metroNIDAZOLE (FLAGYL) 500 MG tablet    Sig: Take 1 tablet (500 mg total) by mouth 2 (two) times daily for 7 days.    Dispense:  14 tablet    Refill:  0    *This clinic note was created using Scientist, clinical (histocompatibility and immunogenetics)Dragon dictation software. Therefore, there may be occasional mistakes despite careful proofreading.  ?    Ahyan Kreeger,  Morrie Sheldon, MD 03/06/17 [phone]

## 2017-03-05 NOTE — ED Triage Notes (Signed)
Pt here for STD check.

## 2017-03-06 LAB — CERVICOVAGINAL ANCILLARY ONLY
Bacterial vaginitis: POSITIVE — AB
Candida vaginitis: NEGATIVE
Chlamydia: NEGATIVE
Neisseria Gonorrhea: NEGATIVE
Trichomonas: NEGATIVE

## 2017-03-06 LAB — RPR: RPR Ser Ql: NONREACTIVE

## 2017-03-06 LAB — HIV ANTIBODY (ROUTINE TESTING W REFLEX): HIV Screen 4th Generation wRfx: NONREACTIVE

## 2017-03-07 LAB — URINE CULTURE: Culture: >=100000 — AB

## 2017-08-07 ENCOUNTER — Ambulatory Visit (HOSPITAL_COMMUNITY)
Admission: EM | Admit: 2017-08-07 | Discharge: 2017-08-07 | Disposition: A | Discharge status: Home / Self Care | Payer: Managed Care, Other (non HMO) | Attending: Family Medicine | Admitting: Family Medicine

## 2017-08-07 ENCOUNTER — Encounter (HOSPITAL_COMMUNITY): Payer: Self-pay | Admitting: Emergency Medicine

## 2017-08-07 ENCOUNTER — Other Ambulatory Visit: Payer: Self-pay

## 2017-08-07 DIAGNOSIS — N898 Other specified noninflammatory disorders of vagina: Secondary | ICD-10-CM | POA: Diagnosis not present

## 2017-08-07 DIAGNOSIS — Z3202 Encounter for pregnancy test, result negative: Secondary | ICD-10-CM

## 2017-08-07 DIAGNOSIS — B9689 Other specified bacterial agents as the cause of diseases classified elsewhere: Secondary | ICD-10-CM | POA: Diagnosis not present

## 2017-08-07 DIAGNOSIS — N760 Acute vaginitis: Secondary | ICD-10-CM | POA: Insufficient documentation

## 2017-08-07 LAB — POCT URINALYSIS DIP (DEVICE)
Bilirubin Urine: NEGATIVE
Glucose, UA: NEGATIVE mg/dL
Hgb urine dipstick: NEGATIVE
Ketones, ur: NEGATIVE mg/dL
Leukocytes, UA: NEGATIVE
Nitrite: NEGATIVE
Protein, ur: NEGATIVE mg/dL
Specific Gravity, Urine: 1.025 (ref 1.005–1.030)
Urobilinogen, UA: 0.2 mg/dL (ref 0.0–1.0)
pH: 5.5 (ref 5.0–8.0)

## 2017-08-07 LAB — POCT PREGNANCY, URINE: Preg Test, Ur: NEGATIVE

## 2017-08-07 MED ORDER — METRONIDAZOLE 500 MG PO TABS: 500.0000 mg | ORAL_TABLET | Freq: Two times a day (BID) | ORAL | 0 refills | Status: AC

## 2017-08-07 NOTE — ED Triage Notes (Signed)
Vaginal discharge for a month

## 2017-08-07 NOTE — ED Provider Notes (Signed)
[facility]    CSN: [id number] Arrival date & time: 08/07/17  1116     History   Chief Complaint Chief Complaint  Patient presents with  . Vaginal Discharge    HPI Madeline Wilkins is a 22 y.o. female no significant past medical history presenting today for evaluation of vaginal discharge.  Patient states that she has had vaginal discharge for the past month.  Notes to have a history of bacterial vaginosis.  States that she feels that this is BV.  She has been looking into ways to prevent this including urination after intercourse as well as her partner cleaning prior to intercourse.  She denies any itching or irritation.  Denies dysuria or increased frequency.  Denies fever, nausea, vomiting, abdominal pain.  Patient is also requesting HIV testing.  HPI  Past Medical History:  Diagnosis Date  . Gonorrhea   . 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 Infection    UTI  . Kidney stone   . Trichomonal infection   . Urinary tract infection 02/23/2015    Patient Active Problem List   Diagnosis Date Noted  . Pregnancy of unknown anatomic location 01/15/2016    Past Surgical History:  Procedure Laterality Date  . INDUCED ABORTION      OB History    Gravida  2   Para  1   Term  1   Preterm      AB  1   Living  1     SAB      TAB  1   Ectopic      Multiple  0   Live Births  1        Obstetric Comments  G2: mid December 2017 in office D&C (elective AB)         Home Medications    Prior to Admission medications   Medication Sig Start Date End Date Taking? Authorizing Provider  metroNIDAZOLE (FLAGYL) 500 MG tablet Take 1 tablet (500 mg total) by mouth 2 (two) times daily for 7 days. 08/07/17 08/14/17  Annalea Alguire, Junius CreamerHallie C, PA-C    Family History Family History  Problem Relation Age of  Onset  . Cancer Neg Hx   . Diabetes Neg Hx   . Heart disease Neg Hx   . Hypertension Neg Hx   . Stroke Neg Hx   . Hearing loss Neg Hx   . Asthma Neg Hx     Social History Social History   Tobacco Use  . Smoking status: Never Smoker  . Smokeless tobacco: Never Used  Substance Use Topics  . Alcohol use: No  . Drug use: No     Allergies   Patient has no known allergies.   Review of Systems Review of Systems  Constitutional: Negative for fever.  Respiratory: Negative for shortness of breath.   Cardiovascular: Negative for chest pain.  Gastrointestinal: Negative for abdominal pain, diarrhea, nausea and vomiting.  Genitourinary: Positive for vaginal discharge. Negative for dysuria, flank pain, genital sores, hematuria, menstrual problem, vaginal bleeding and vaginal pain.  Musculoskeletal: Negative for back pain.  Skin: Negative for rash.  Neurological: Negative for dizziness, light-headedness and headaches.     Physical Exam Triage Vital Signs ED Triage Vitals  Enc Vitals Group     BP 08/07/17 1151 (!) 127/91  Pulse Rate 08/07/17 1151 73     Resp 08/07/17 1151 18     Temp 08/07/17 1151 98.7 F (37.1 C)     Temp Source 08/07/17 1151 Oral     SpO2 08/07/17 1151 100 %     Weight --      Height --      Head Circumference --      Peak Flow --      Pain Score 08/07/17 1149 0     Pain Loc --      Pain Edu? --      Excl. in GC? --    No data found.  Updated Vital Signs BP (!) 127/91 (BP Location: Right Arm)   Pulse 73   Temp 98.7 F (37.1 C) (Oral)   Resp 18   SpO2 100%   Visual Acuity Right Eye Distance:   Left Eye Distance:   Bilateral Distance:    Right Eye Near:   Left Eye Near:    Bilateral Near:     Physical Exam  Constitutional: She appears well-developed and well-nourished. No distress.  HENT:  Head: Normocephalic and atraumatic.  Eyes: Conjunctivae are normal.  Neck: Neck supple.  Cardiovascular: Normal rate and regular rhythm.  No  murmur heard. Pulmonary/Chest: Effort normal and breath sounds normal. No respiratory distress.  Abdominal: Soft. There is no tenderness.  Abdomen soft, nondistended, nontender to light and deep palpation throughout all 4 quadrants and epigastrium.  Negative rebound.  Genitourinary:  Genitourinary Comments: Deferred  Musculoskeletal: She exhibits no edema.  Neurological: She is alert.  Skin: Skin is warm and dry.  Psychiatric: She has a normal mood and affect.  Nursing note and vitals reviewed.    UC Treatments / Results  Labs (all labs ordered are listed, but only abnormal results are displayed) Labs Reviewed  HIV ANTIBODY (ROUTINE TESTING)  RPR  POCT URINALYSIS DIP (DEVICE)  POCT PREGNANCY, URINE  CERVICOVAGINAL ANCILLARY ONLY    EKG None  Radiology No results found.  Procedures Procedures (including critical care time)  Medications Ordered in UC Medications - No data to display  Initial Impression / Assessment and Plan / UC Course  I have reviewed the triage vital signs and the nursing notes.  Pertinent labs & imaging results that were available during my care of the patient were reviewed by me and considered in my medical decision making (see chart for details).     Vaginal discharge likely BV given history.  We will go ahead and initiate on oral metronidazole.  Discussed refraining from alcohol.  Vaginal swab obtained.  We will also withdrawal blood work for HIV and RPR to check for HIV and syphilis.  Will call patient with results and alter treatment as needed.Discussed strict return precautions. Patient verbalized understanding and is agreeable with plan.  Final Clinical Impressions(s) / UC Diagnoses   Final diagnoses:  Vaginal discharge     Discharge Instructions     I have sent metronidazole for you to take twice daily for the next week.  Please do not drink alcohol while taking this.  We are testing you for Gonorrhea, Chlamydia, Trichomonas, Yeast  and Bacterial Vaginosis. We will call you if anything is positive and let you know if you require any further treatment. Please inform partners of any positive results.   Please return if symptoms not improving with treatment, development of fever, nausea, vomiting, abdominal pain.     ED Prescriptions    Medication Sig Dispense Auth. Provider  metroNIDAZOLE (FLAGYL) 500 MG tablet Take 1 tablet (500 mg total) by mouth 2 (two) times daily for 7 days. 14 tablet Ailyne Pawley, [place] C, PA-C     Controlled Substance Prescriptions [place] Controlled Substance Registry consulted? Not Applicable   Lew Dawes, [place] 08/07/17 1229

## 2017-08-07 NOTE — ED Notes (Signed)
Unable to urinate currently.  Given water.  Patient instructed on urine specimens

## 2017-08-07 NOTE — Discharge Instructions (Signed)
I have sent metronidazole for you to take twice daily for the next week.  Please do not drink alcohol while taking this.  We are testing you for Gonorrhea, Chlamydia, Trichomonas, Yeast and Bacterial Vaginosis. We will call you if anything is positive and let you know if you require any further treatment. Please inform partners of any positive results.   Please return if symptoms not improving with treatment, development of fever, nausea, vomiting, abdominal pain.

## 2017-08-08 ENCOUNTER — Telehealth (HOSPITAL_COMMUNITY): Payer: Self-pay

## 2017-08-08 LAB — CERVICOVAGINAL ANCILLARY ONLY
Bacterial vaginitis: POSITIVE — AB
Candida vaginitis: NEGATIVE
Chlamydia: NEGATIVE
Neisseria Gonorrhea: NEGATIVE
Trichomonas: NEGATIVE

## 2017-08-08 LAB — HIV ANTIBODY (ROUTINE TESTING W REFLEX): HIV Screen 4th Generation wRfx: NONREACTIVE

## 2017-08-08 LAB — RPR: RPR Ser Ql: NONREACTIVE

## 2017-08-08 NOTE — Telephone Encounter (Signed)
Bacterial Vaginosis test is positive.  Prescription for metronidazole was given at the urgent care visit. Pt contacted regarding results. Answered all questions. Verbalized understanding.   

## 2017-11-17 ENCOUNTER — Other Ambulatory Visit: Payer: Self-pay

## 2017-11-17 ENCOUNTER — Emergency Department (HOSPITAL_COMMUNITY)
Admission: EM | Admit: 2017-11-17 | Discharge: 2017-11-17 | Disposition: A | Discharge status: Home / Self Care | Payer: Managed Care, Other (non HMO) | Attending: Emergency Medicine | Admitting: Emergency Medicine

## 2017-11-17 ENCOUNTER — Encounter (HOSPITAL_COMMUNITY): Payer: Self-pay | Admitting: Emergency Medicine

## 2017-11-17 DIAGNOSIS — B9689 Other specified bacterial agents as the cause of diseases classified elsewhere: Secondary | ICD-10-CM | POA: Diagnosis not present

## 2017-11-17 DIAGNOSIS — N760 Acute vaginitis: Secondary | ICD-10-CM | POA: Insufficient documentation

## 2017-11-17 DIAGNOSIS — N898 Other specified noninflammatory disorders of vagina: Secondary | ICD-10-CM | POA: Diagnosis present

## 2017-11-17 LAB — URINALYSIS, COMPLETE (UACMP) WITH MICROSCOPIC
Bacteria, UA: NONE SEEN
Bilirubin Urine: NEGATIVE
Glucose, UA: NEGATIVE mg/dL
Ketones, ur: NEGATIVE mg/dL
Leukocytes, UA: NEGATIVE
Nitrite: NEGATIVE
Protein, ur: NEGATIVE mg/dL
Specific Gravity, Urine: 1.025 (ref 1.005–1.030)
pH: 6 (ref 5.0–8.0)

## 2017-11-17 LAB — WET PREP, GENITAL
Sperm: NONE SEEN
Trich, Wet Prep: NONE SEEN
Yeast Wet Prep HPF POC: NONE SEEN

## 2017-11-17 LAB — PREGNANCY, URINE: Preg Test, Ur: NEGATIVE

## 2017-11-17 LAB — GC/CHLAMYDIA PROBE AMP (~~LOC~~) NOT AT ARMC
Chlamydia: NEGATIVE
Neisseria Gonorrhea: NEGATIVE

## 2017-11-17 MED ORDER — METRONIDAZOLE 0.75 % VA GEL: 1.0000 | Freq: Two times a day (BID) | VAGINAL | 0 refills | Status: DC

## 2017-11-17 NOTE — ED Triage Notes (Signed)
C/o pelvic pain and white vaginal discharge x 2 weeks.

## 2017-11-17 NOTE — ED Provider Notes (Signed)
[facility] EMERGENCY DEPARTMENT Provider Note   CSN: [id number] Arrival date & time: 11/17/17  0132     History   Chief Complaint Chief Complaint  Patient presents with  . Pelvic Pain    HPI Chrishonda Wisdom is a 22 y.o. female.  Patient to ED for evaluation of vaginal discharge for the past 2 weeks, intermittent, associated with mild pelvic pain. Pain is present when she holds her urine for a long period of time or sleeps on her stomach. No fever, vomiting, change in bowel movements. She is sexually active, sometimes with barrier protection, single partner. She does not have routine GYN care.  The history is provided by the patient. No language interpreter was used.    Past Medical History:  Diagnosis Date  . Gonorrhea   . 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 Infection    UTI  . Kidney stone   . Trichomonal infection   . Urinary tract infection 02/23/2015    Patient Active Problem List   Diagnosis Date Noted  . Pregnancy of unknown anatomic location 01/15/2016    Past Surgical History:  Procedure Laterality Date  . INDUCED ABORTION       OB History    Gravida  2   Para  1   Term  1   Preterm      AB  1   Living  1     SAB      TAB  1   Ectopic      Multiple  0   Live Births  1        Obstetric Comments  G2: mid December 2017 in office D&C (elective AB)         Home Medications    Prior to Admission medications   Medication Sig Start Date End Date Taking? Authorizing Provider  etonogestrel (NEXPLANON) 68 MG IMPL implant 1 each by Subdermal route once. Left arm (10/2015)   Yes [provider]    Family History Family History  Problem Relation Age of Onset  . Cancer Neg Hx   . Diabetes Neg Hx   . Heart disease Neg Hx   . Hypertension Neg Hx    . Stroke Neg Hx   . Hearing loss Neg Hx   . Asthma Neg Hx     Social History Social History   Tobacco Use  . Smoking status: Never Smoker  . Smokeless tobacco: Never Used  Substance Use Topics  . Alcohol use: No  . Drug use: No     Allergies   Patient has no known allergies.   Review of Systems Review of Systems  Constitutional: Negative for chills and fever.  Gastrointestinal: Negative.  Negative for abdominal pain, nausea and vomiting.  Genitourinary: Positive for pelvic pain and vaginal discharge. Negative for dysuria and vaginal bleeding.  Musculoskeletal: Negative.  Negative for back pain.  Skin: Negative.   Neurological: Negative.      Physical Exam Updated Vital Signs BP 118/90   Pulse (!) 42   Temp 98.3 F (36.8 C)   Resp 17   SpO2 100%   Physical Exam  Constitutional: She is oriented to person, place, and time. She appears well-developed and well-nourished.  Neck: Normal range of motion.  Pulmonary/Chest: Effort normal.  Abdominal: Soft.  There is no tenderness.  Genitourinary:  Genitourinary Comments: No cervical discharge presents. No CMT or adnexal tenderness.   Neurological: She is alert and oriented to person, place, and time.  Skin: Skin is warm and dry.     ED Treatments / Results  Labs (all labs ordered are listed, but only abnormal results are displayed) Labs Reviewed  URINALYSIS, COMPLETE (UACMP) WITH MICROSCOPIC - Abnormal; Notable for the following components:      Result Value   Hgb urine dipstick MODERATE (*)    All other components within normal limits  PREGNANCY, URINE    EKG None  Radiology No results found.  Procedures Procedures (including critical care time)  Medications Ordered in ED Medications - No data to display   Initial Impression / Assessment and Plan / ED Course  I have reviewed the triage vital signs and the nursing notes.  Pertinent labs & imaging results that were available during my care of the  patient were reviewed by me and considered in my medical decision making (see chart for details).     Patient to ED with 2 weeks of vaginal discharge. No fever. Some mild pelvic pain that is intermittent, none currently.   Exam is unremarkable. No pelvic tenderness or significant discharge. She has BV on wet prep. Discussed treatment in ED for STD and patient declines stating she would wait for culture results.   Review of chart shows multiple ED and Urgent Care visits for gynecologic complaints. Discussed getting established for routine GYN care, including PAP screening. Will provide referral.   Final Clinical Impressions(s) / ED Diagnoses   Final diagnoses:  None   1. BV  ED Discharge Orders    None       Elpidio AnisUpstill, Jonavan Vanhorn, PA-C 11/17/17 [id number]    Glynn Octaveancour, Stephen, MD 11/17/17 0600

## 2017-12-25 ENCOUNTER — Encounter: Payer: Self-pay | Admitting: *Deleted

## 2017-12-29 ENCOUNTER — Encounter: Payer: Self-pay | Admitting: *Deleted

## 2018-03-29 ENCOUNTER — Encounter (HOSPITAL_COMMUNITY): Payer: Self-pay | Admitting: *Deleted

## 2018-03-29 ENCOUNTER — Emergency Department (HOSPITAL_COMMUNITY)
Admission: EM | Admit: 2018-03-29 | Discharge: 2018-03-29 | Disposition: A | Discharge status: Home / Self Care | Payer: Managed Care, Other (non HMO) | Attending: Emergency Medicine | Admitting: Emergency Medicine

## 2018-03-29 ENCOUNTER — Other Ambulatory Visit: Payer: Self-pay

## 2018-03-29 DIAGNOSIS — Y92410 Unspecified street and highway as the place of occurrence of the external cause: Secondary | ICD-10-CM | POA: Insufficient documentation

## 2018-03-29 DIAGNOSIS — R55 Syncope and collapse: Secondary | ICD-10-CM | POA: Insufficient documentation

## 2018-03-29 DIAGNOSIS — Y9389 Activity, other specified: Secondary | ICD-10-CM | POA: Diagnosis not present

## 2018-03-29 DIAGNOSIS — S80212A Abrasion, left knee, initial encounter: Secondary | ICD-10-CM | POA: Insufficient documentation

## 2018-03-29 DIAGNOSIS — Z79899 Other long term (current) drug therapy: Secondary | ICD-10-CM | POA: Insufficient documentation

## 2018-03-29 DIAGNOSIS — M2560 Stiffness of unspecified joint, not elsewhere classified: Secondary | ICD-10-CM | POA: Insufficient documentation

## 2018-03-29 DIAGNOSIS — S80211A Abrasion, right knee, initial encounter: Secondary | ICD-10-CM | POA: Diagnosis not present

## 2018-03-29 DIAGNOSIS — S60511A Abrasion of right hand, initial encounter: Secondary | ICD-10-CM | POA: Diagnosis not present

## 2018-03-29 DIAGNOSIS — S60512A Abrasion of left hand, initial encounter: Secondary | ICD-10-CM | POA: Insufficient documentation

## 2018-03-29 DIAGNOSIS — R51 Headache: Secondary | ICD-10-CM | POA: Insufficient documentation

## 2018-03-29 DIAGNOSIS — S8991XA Unspecified injury of right lower leg, initial encounter: Secondary | ICD-10-CM | POA: Diagnosis present

## 2018-03-29 DIAGNOSIS — Y999 Unspecified external cause status: Secondary | ICD-10-CM | POA: Diagnosis not present

## 2018-03-29 MED ORDER — IBUPROFEN 400 MG PO TABS
600.0000 mg | ORAL_TABLET | Freq: Once | ORAL | Status: AC
  Administered 2018-03-29: 600 mg via ORAL
  Filled 2018-03-29: qty 1

## 2018-03-29 MED ORDER — IBUPROFEN 600 MG PO TABS: 600.0000 mg | ORAL_TABLET | Freq: Three times a day (TID) | ORAL | 0 refills | Status: DC | PRN

## 2018-03-29 MED ORDER — OXYCODONE-ACETAMINOPHEN 5-325 MG PO TABS
1.0000 | ORAL_TABLET | Freq: Once | ORAL | Status: AC
  Administered 2018-03-29: 1 via ORAL
  Filled 2018-03-29: qty 1

## 2018-03-29 MED ORDER — METHOCARBAMOL 500 MG PO TABS: 500.0000 mg | ORAL_TABLET | Freq: Three times a day (TID) | ORAL | 0 refills | Status: DC | PRN

## 2018-03-29 MED ORDER — METHOCARBAMOL 500 MG PO TABS
500.0000 mg | ORAL_TABLET | Freq: Once | ORAL | Status: AC
  Administered 2018-03-29: 500 mg via ORAL
  Filled 2018-03-29: qty 1
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
## 2018-03-29 NOTE — ED Notes (Signed)
Pt self-removed IV because "it was not being used".
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
## 2018-03-29 NOTE — ED Notes (Signed)
Pt discharged from ED; instructions provided and scripts given; Pt encouraged to return to ED if symptoms worsen and to f/u with PCP; Pt verbalized understanding of all instructions 

## 2018-05-22 ENCOUNTER — Telehealth: Payer: Self-pay | Admitting: Obstetrics & Gynecology

## 2018-05-22 NOTE — Telephone Encounter (Signed)
Called the patient back per the message left with our call center. Received a message stating the mailbox is full and cannot receive messages at this time.

## 2018-07-09 ENCOUNTER — Encounter (HOSPITAL_COMMUNITY): Payer: Self-pay | Admitting: Emergency Medicine

## 2018-07-09 ENCOUNTER — Ambulatory Visit (HOSPITAL_COMMUNITY)
Admission: EM | Admit: 2018-07-09 | Discharge: 2018-07-09 | Disposition: A | Discharge status: Home / Self Care | Payer: Managed Care, Other (non HMO) | Attending: Family Medicine | Admitting: Family Medicine

## 2018-07-09 ENCOUNTER — Other Ambulatory Visit: Payer: Self-pay

## 2018-07-09 DIAGNOSIS — Z3202 Encounter for pregnancy test, result negative: Secondary | ICD-10-CM | POA: Diagnosis not present

## 2018-07-09 DIAGNOSIS — Z113 Encounter for screening for infections with a predominantly sexual mode of transmission: Secondary | ICD-10-CM | POA: Diagnosis not present

## 2018-07-09 DIAGNOSIS — N898 Other specified noninflammatory disorders of vagina: Secondary | ICD-10-CM | POA: Diagnosis not present

## 2018-07-09 LAB — POCT URINALYSIS DIP (DEVICE)
Bilirubin Urine: NEGATIVE
Glucose, UA: NEGATIVE mg/dL
Hgb urine dipstick: NEGATIVE
Ketones, ur: NEGATIVE mg/dL
Leukocytes,Ua: NEGATIVE
Nitrite: NEGATIVE
Protein, ur: NEGATIVE mg/dL
Specific Gravity, Urine: >=1.03 (ref 1.005–1.030)
Urobilinogen, UA: 0.2 mg/dL (ref 0.0–1.0)
pH: 5.5 (ref 5.0–8.0)

## 2018-07-09 LAB — POCT PREGNANCY, URINE: Preg Test, Ur: NEGATIVE

## 2018-07-09 MED ORDER — METRONIDAZOLE 500 MG PO TABS: 500.0000 mg | ORAL_TABLET | Freq: Two times a day (BID) | ORAL | 0 refills | Status: AC

## 2018-07-09 MED ORDER — ONDANSETRON 4 MG PO TBDP: 4.0000 mg | ORAL_TABLET | Freq: Three times a day (TID) | ORAL | 0 refills | Status: DC | PRN

## 2018-07-09 NOTE — Discharge Instructions (Signed)
Your urine did not show any sign of infection. Please begin metronidazole twice daily for 1 week to treat Bacterial Vaginosis. May use zofran 30 minutes before to help with nausea- dissolves under tongue.   We are testing you for Gonorrhea, Chlamydia, Trichomonas, Yeast and Bacterial Vaginosis, HIV, [place]. We will call you if anything is positive and let you know if you require any further treatment. Please inform partners of any positive results.   Please return if symptoms not improving with treatment, development of fever, nausea, vomiting, abdominal pain.

## 2018-07-09 NOTE — ED Triage Notes (Signed)
Vaginal discharge for a week.  Patient has painful urination

## 2018-07-09 NOTE — ED Provider Notes (Signed)
[facility]    CSN: [id number] Arrival date & time: 07/09/18  1625     History   Chief Complaint Chief Complaint  Patient presents with  . Vaginal Discharge    HPI Madeline Wilkins is a 23 y.o. 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  Patient states that she has had vaginal discharge for the past week.  She also notes that she has had some slight discomfort with holding her bladder.  Denies dysuria or burning with urination.  Denies increased frequency, hematuria.  She denies any itching or irritation associated with discharge.  Denies any fevers, nausea, vomiting or abdominal pain.  Denies pelvic pain.  Patient has Nexplanon which she uses for birth control.  Has irregular cycles every few months.  She does endorse a new partner which she would like to be checked for STDs including HIV and syphilis as she has had past exposure.  HPI  Past Medical History:  Diagnosis Date  . Gonorrhea   . 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   Patient will need to be screened every 3 months 02/2015 nonreactive; exposed in Summer 2016, former partner incarcerated March 2017  NR July 2017  NR   Sept 2017  NR  . Infection    UTI  . Kidney stone   . Trichomonal infection   . Urinary tract infection 02/23/2015    Patient Active Problem List   Diagnosis Date Noted  . Pregnancy of unknown anatomic location 01/15/2016    Past Surgical History:  Procedure Laterality Date  . INDUCED ABORTION      OB History    Gravida  2   Para  1   Term  1   Preterm      AB  1   Living  1     SAB      TAB  1   Ectopic      Multiple  0   Live Births  1        Obstetric Comments  G2: mid December 2017 in office D&C (elective AB)         Home Medications    Prior to Admission medications   Medication Sig Start Date End Date Taking? Authorizing Provider  etonogestrel (NEXPLANON) 68 MG IMPL implant 1 each by Subdermal route once. Left  arm (10/2015)    [provider]  metroNIDAZOLE (FLAGYL) 500 MG tablet Take 1 tablet (500 mg total) by mouth 2 (two) times daily for 7 days. 07/09/18 07/16/18  Mahoganie Basher C, PA-C  ondansetron (ZOFRAN ODT) 4 MG disintegrating tablet Take 1 tablet (4 mg total) by mouth every 8 (eight) hours as needed for nausea or vomiting. 07/09/18   Tylee Newby, Junius Creamer, PA-C    Family History Family History  Problem Relation Age of Onset  . Cancer Neg Hx   . Diabetes Neg Hx   . Heart disease Neg Hx   . Hypertension Neg Hx   . Stroke Neg Hx   . Hearing loss Neg Hx   . Asthma Neg Hx     Social History Social History   Tobacco Use  . Smoking status: Never Smoker  . Smokeless tobacco: Never Used  Substance Use Topics  . Alcohol use: No  . Drug use: No     Allergies   Patient has no known allergies.   Review of Systems Review of Systems  Constitutional: Negative for fever.  Respiratory: Negative for shortness of breath.   Cardiovascular:  Negative for chest pain.  Gastrointestinal: Negative for abdominal pain, diarrhea, nausea and vomiting.  Genitourinary: Positive for urgency and vaginal discharge. Negative for dysuria, flank pain, genital sores, hematuria, menstrual problem, vaginal bleeding and vaginal pain.  Musculoskeletal: Negative for back pain.  Skin: Negative for rash.  Neurological: Negative for dizziness, light-headedness and headaches.     Physical Exam Triage Vital Signs ED Triage Vitals  Enc Vitals Group     BP 07/09/18 1653 128/83     Pulse Rate 07/09/18 1653 73     Resp 07/09/18 1653 18     Temp 07/09/18 1653 98.5 F (36.9 C)     Temp Source 07/09/18 1653 Oral     SpO2 07/09/18 1653 100 %     Weight --      Height --      Head Circumference --      Peak Flow --      Pain Score 07/09/18 1651 6     Pain Loc --      Pain Edu? --      Excl. in GC? --    No data found.  Updated Vital Signs BP 128/83 (BP Location: Left Arm)   Pulse 73   Temp 98.5 F  (36.9 C) (Oral)   Resp 18   SpO2 100%   Visual Acuity Right Eye Distance:   Left Eye Distance:   Bilateral Distance:    Right Eye Near:   Left Eye Near:    Bilateral Near:     Physical Exam Vitals signs and nursing note reviewed.  Constitutional:      Appearance: She is well-developed.     Comments: No acute distress  HENT:     Head: Normocephalic and atraumatic.     Nose: Nose normal.  Eyes:     Conjunctiva/sclera: Conjunctivae normal.  Neck:     Musculoskeletal: Neck supple.  Cardiovascular:     Rate and Rhythm: Normal rate.  Pulmonary:     Effort: Pulmonary effort is normal. No respiratory distress.  Abdominal:     General: There is no distension.     Comments: Abdomen soft, nondistended, nontender to light deep palpation throughout all 4 quadrants and suprapubic area of abdomen  Genitourinary:    Comments: Deferred Musculoskeletal: Normal range of motion.  Skin:    General: Skin is warm and dry.  Neurological:     Mental Status: She is alert and oriented to person, place, and time.      UC Treatments / Results  Labs (all labs ordered are listed, but only abnormal results are displayed) Labs Reviewed  HIV ANTIBODY (ROUTINE TESTING W REFLEX)  RPR  POC URINE PREG, ED  POCT URINALYSIS DIP (DEVICE)  POCT PREGNANCY, URINE  CERVICOVAGINAL ANCILLARY ONLY    EKG None  Radiology No results found.  Procedures Procedures (including critical care time)  Medications Ordered in UC Medications - No data to display  Initial Impression / Assessment and Plan / UC Course  I have reviewed the triage vital signs and the nursing notes.  Pertinent labs & imaging results that were available during my care of the patient were reviewed by me and considered in my medical decision making (see chart for details).     UA negative for signs of infection, most likely BV as cause of symptoms as has history of this.  Will initiate on metronidazole twice daily x1 week  empirically.  Zofran to use prior to taking medicine as this causes her nausea.  Will call patient with results of swab as well as blood work and alter treatment as needed.Discussed strict return precautions. Patient verbalized understanding and is agreeable with plan.  Final Clinical Impressions(s) / UC Diagnoses   Final diagnoses:  Vaginal discharge  Screen for STD (sexually transmitted disease)     Discharge Instructions     Your urine did not show any sign of infection. Please begin metronidazole twice daily for 1 week to treat Bacterial Vaginosis. May use zofran 30 minutes before to help with nausea- dissolves under tongue.   We are testing you for Gonorrhea, Chlamydia, Trichomonas, Yeast and Bacterial Vaginosis, HIV, [place]. We will call you if anything is positive and let you know if you require any further treatment. Please inform partners of any positive results.   Please return if symptoms not improving with treatment, development of fever, nausea, vomiting, abdominal pain.    ED Prescriptions    Medication Sig Dispense Auth. Provider   ondansetron (ZOFRAN ODT) 4 MG disintegrating tablet Take 1 tablet (4 mg total) by mouth every 8 (eight) hours as needed for nausea or vomiting. 20 tablet Idalia Allbritton C, PA-C   metroNIDAZOLE (FLAGYL) 500 MG tablet Take 1 tablet (500 mg total) by mouth 2 (two) times daily for 7 days. 14 tablet Arlicia Paquette, BowringHallie C, PA-C     Controlled Substance Prescriptions [place] Controlled Substance Registry consulted? Not Applicable   Lew DawesWieters, Darlyne Schmiesing C, [place] JerseyPA-C 07/09/18 1808

## 2018-07-10 ENCOUNTER — Telehealth (HOSPITAL_COMMUNITY): Payer: Self-pay | Admitting: Emergency Medicine

## 2018-07-10 LAB — CERVICOVAGINAL ANCILLARY ONLY
Bacterial vaginitis: POSITIVE — AB
Candida vaginitis: POSITIVE — AB
Chlamydia: NEGATIVE
Neisseria Gonorrhea: NEGATIVE
Trichomonas: NEGATIVE

## 2018-07-10 LAB — RPR: RPR Ser Ql: NONREACTIVE

## 2018-07-10 LAB — HIV ANTIBODY (ROUTINE TESTING W REFLEX): HIV Screen 4th Generation wRfx: NONREACTIVE

## 2018-07-10 MED ORDER — FLUCONAZOLE 150 MG PO TABS: 150.0000 mg | ORAL_TABLET | Freq: Once | ORAL | 0 refills | Status: AC

## 2018-07-10 NOTE — Telephone Encounter (Signed)
Bacterial Vaginosis test is positive.  Prescription for metronidazole was given at the urgent care visit. Pt contacted regarding results. Answered all questions. Verbalized understanding.  Test for candida (yeast) was positive.  Prescription for fluconazole 150mg po now, repeat dose in 3d if needed, #2 no refills, sent to the pharmacy of record.  Recheck or followup with PCP for further evaluation if symptoms are not improving.    Patient contacted and made aware of all results, all questions answered.   

## 2019-01-09 ENCOUNTER — Encounter (HOSPITAL_COMMUNITY): Payer: Self-pay

## 2019-01-09 ENCOUNTER — Other Ambulatory Visit: Payer: Self-pay

## 2019-01-09 ENCOUNTER — Ambulatory Visit (HOSPITAL_COMMUNITY)
Admission: EM | Admit: 2019-01-09 | Discharge: 2019-01-09 | Disposition: A | Discharge status: Home / Self Care | Payer: Managed Care, Other (non HMO) | Attending: Family Medicine | Admitting: Family Medicine

## 2019-01-09 DIAGNOSIS — Z3202 Encounter for pregnancy test, result negative: Secondary | ICD-10-CM

## 2019-01-09 DIAGNOSIS — N898 Other specified noninflammatory disorders of vagina: Secondary | ICD-10-CM | POA: Insufficient documentation

## 2019-01-09 LAB — HCG, QUANTITATIVE, PREGNANCY: hCG, Beta Chain, Quant, S: <1 m[IU]/mL (ref <5)

## 2019-01-09 LAB — HIV ANTIBODY (ROUTINE TESTING W REFLEX): HIV Screen 4th Generation wRfx: NONREACTIVE

## 2019-01-09 LAB — POCT PREGNANCY, URINE: Preg Test, Ur: NEGATIVE

## 2019-01-09 MED ORDER — CEFTRIAXONE SODIUM 250 MG IJ SOLR
INTRAMUSCULAR | Status: AC
  Filled 2019-01-09: qty 250

## 2019-01-09 MED ORDER — AZITHROMYCIN 250 MG PO TABS
1000.0000 mg | ORAL_TABLET | Freq: Once | ORAL | Status: AC
  Administered 2019-01-09: 1000 mg via ORAL

## 2019-01-09 MED ORDER — FLUCONAZOLE 150 MG PO TABS: ORAL_TABLET | ORAL | 0 refills | Status: DC

## 2019-01-09 MED ORDER — AZITHROMYCIN 250 MG PO TABS
ORAL_TABLET | ORAL | Status: AC
  Filled 2019-01-09: qty 4

## 2019-01-09 MED ORDER — METRONIDAZOLE 500 MG PO TABS: 500.0000 mg | ORAL_TABLET | Freq: Two times a day (BID) | ORAL | 0 refills | Status: DC

## 2019-01-09 MED ORDER — CEFTRIAXONE SODIUM 250 MG IJ SOLR
250.0000 mg | Freq: Once | INTRAMUSCULAR | Status: AC
  Administered 2019-01-09: 250 mg via INTRAMUSCULAR

## 2019-01-09 NOTE — Discharge Instructions (Addendum)

## 2019-01-09 NOTE — ED Notes (Signed)
Pt states she does not want to wait for any of her test results because she needs to be at work, MD informed.

## 2019-01-09 NOTE — ED Provider Notes (Signed)
[facility]   [id number] 01/09/19 Arrival Time: 1050  ASSESSMENT & PLAN:  1. Vaginal discharge       Discharge Instructions     You have been given the following medications today for treatment of suspected gonorrhea and/or chlamydia:  cefTRIAXone (ROCEPHIN) injection 250 mg azithromycin (ZITHROMAX) tablet 1,000 mg  Even though we have treated you today, we have sent testing for sexually transmitted infections. We will notify you of any positive results once they are received. If required, we will prescribe any medications you might need.  Please refrain from all sexual activity for at least the next seven days.      Pending: Labs Reviewed  CERVICOVAGINAL ANCILLARY ONLY   Declines HIV/RPR testing. Will notify of any positive results. Instructed to refrain from sexual activity for at least seven days.  Reviewed expectations re: course of current medical issues. Questions answered. Outlined signs and symptoms indicating need for more acute intervention. Patient verbalized understanding. After Visit Summary given.   SUBJECTIVE:  Madeline Wilkins is a 23 y.o. female who presents with complaint of vaginal discharge. Onset gradual. First noticed 1-2 w ago. Describes discharge as thick and white. H/O BV with similar symptoms. Denies: urinary frequency, hematuria, urinary hesitancy, urinary retention, urinary incontinence and dysuria. Afebrile. No abdominal or pelvic pain. Normal PO intake wihout n/v. No rashes or lesions. Reports that she is sexually active with single female partner; without regular condom use. OTC treatment: none. History of STI: none reported. She has concerns that she might be pregnant; requests testing.  No LMP recorded. Patient has had an implant. Question irregular menstrual cycle over the past couple of months.  ROS: As per HPI. All other systems negative.   OBJECTIVE:  Vitals:   01/09/19 1124  BP: 122/80  Pulse: 75  Resp: 16  Temp:  98.6 F (37 C)  TempSrc: Oral  SpO2: 99%     General appearance: alert, cooperative, appears stated age and no distress Throat: lips, mucosa, and tongue normal; teeth and gums normal CV: RRR Lungs: CTAB Back: no CVA tenderness; FROM at waist Abdomen: soft, non-tender GU: deferred Skin: warm and dry Psychological: alert and cooperative; normal mood and affect.   Labs Reviewed  CERVICOVAGINAL ANCILLARY ONLY    No Known Allergies  Past Medical History:  Diagnosis Date  . Gonorrhea   . 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 Infection    UTI  . Kidney stone   . Trichomonal infection   . Urinary tract infection 02/23/2015   Family History  Problem Relation Age of Onset  . Cancer Neg Hx   . Diabetes Neg Hx   . Heart disease Neg Hx   . Hypertension Neg Hx   . Stroke Neg Hx   . Hearing loss Neg Hx   . Asthma Neg Hx    Social History   Socioeconomic History  . Marital status: Single    Spouse name: Not on file  . Number of children: Not on file  . Years of education: Not on file  . Highest education level: Not on file  Occupational History  . Not on file  Social Needs  . Financial resource strain: Not on file  . Food insecurity    Worry: Not on file    Inability: Not on file  .  Transportation needs    Medical: Not on file    Non-medical: Not on file  Tobacco Use  . Smoking status: Never Smoker  . Smokeless tobacco: Never Used  Substance and Sexual Activity  . Alcohol use: No  . Drug use: No  . Sexual activity: Yes    Birth control/protection: None  Lifestyle  . Physical activity    Days per week: Not on file    Minutes per session: Not on file  . Stress: Not on file  Relationships  . Social Herbalist on phone: Not on file    Gets together: Not on file    Attends religious  service: Not on file    Active member of club or organization: Not on file    Attends meetings of clubs or organizations: Not on file    Relationship status: Not on file  . Intimate partner violence    Fear of current or ex partner: Not on file    Emotionally abused: Not on file    Physically abused: Not on file    Forced sexual activity: Not on file  Other Topics Concern  . Not on file  Social History Narrative  . Not on file          Vanessa Kick, MD 01/11/19 [phone]

## 2019-01-09 NOTE — ED Triage Notes (Signed)
Patient presents to Urgent Care with complaints of itchy white vaginal discharge since about 2 weeks ago. Patient reports she thinks it is BV, has not tried anything otc for sx.

## 2019-01-10 LAB — RPR: RPR Ser Ql: NONREACTIVE

## 2019-01-11 ENCOUNTER — Telehealth (INDEPENDENT_AMBULATORY_CARE_PROVIDER_SITE_OTHER): Payer: Managed Care, Other (non HMO) | Admitting: Obstetrics and Gynecology

## 2019-01-11 DIAGNOSIS — Z309 Encounter for contraceptive management, unspecified: Secondary | ICD-10-CM

## 2019-01-11 NOTE — Telephone Encounter (Signed)
Nexplanon has expired. Would like to get birthcontrol until visit in December.

## 2019-01-13 LAB — CERVICOVAGINAL ANCILLARY ONLY
Bacterial vaginitis: POSITIVE — AB
Candida vaginitis: POSITIVE — AB
Chlamydia: NEGATIVE
Neisseria Gonorrhea: NEGATIVE
Trichomonas: POSITIVE — AB

## 2019-01-14 ENCOUNTER — Telehealth (HOSPITAL_COMMUNITY): Payer: Self-pay | Admitting: Emergency Medicine

## 2019-01-14 NOTE — Telephone Encounter (Signed)
Patient contacted and made aware of   cytology results, all questions answered  

## 2019-01-14 NOTE — Telephone Encounter (Signed)
Called pt to follow up on her phone call. She did not answer. Mailbox is full and cannot leave message. Will send My Chart message.

## 2019-01-14 NOTE — Telephone Encounter (Signed)
Bacterial Vaginosis test is positive.  Prescription for metronidazole was given at the urgent care visit. Candida (yeast) is positive.  Prescription for fluconazole was given at the urgent care visit.    Trichomonas is positive. Rx metronidazole was given at the urgent care visit. Pt needs education to please refrain from sexual intercourse for 7 days to give the medicine time to work. Sexual partners need to be notified and tested/treated. Condoms may reduce risk of reinfection. Recheck for further evaluation if symptoms are not improving.   Attempted to reach patient. No answer at this time. Voicemail full.

## 2019-01-31 ENCOUNTER — Encounter (HOSPITAL_COMMUNITY): Payer: Self-pay | Admitting: Family Medicine

## 2019-01-31 ENCOUNTER — Other Ambulatory Visit: Payer: Self-pay

## 2019-01-31 ENCOUNTER — Ambulatory Visit (HOSPITAL_COMMUNITY)
Admission: EM | Admit: 2019-01-31 | Discharge: 2019-01-31 | Disposition: A | Discharge status: Home / Self Care | Payer: Managed Care, Other (non HMO) | Attending: Family Medicine | Admitting: Family Medicine

## 2019-01-31 DIAGNOSIS — Z202 Contact with and (suspected) exposure to infections with a predominantly sexual mode of transmission: Secondary | ICD-10-CM | POA: Insufficient documentation

## 2019-01-31 DIAGNOSIS — Z8619 Personal history of other infectious and parasitic diseases: Secondary | ICD-10-CM | POA: Diagnosis not present

## 2019-01-31 DIAGNOSIS — Z113 Encounter for screening for infections with a predominantly sexual mode of transmission: Secondary | ICD-10-CM | POA: Diagnosis not present

## 2019-01-31 MED ORDER — METRONIDAZOLE 500 MG PO TABS: 500.0000 mg | ORAL_TABLET | Freq: Two times a day (BID) | ORAL | 0 refills | Status: DC

## 2019-01-31 NOTE — ED Triage Notes (Signed)
Pt states she testes positive for Trichomonas, Bacterial Vaginosis and Yeast 2 weeks ago. Pt  repors she just took the medication x 3 days as she lost the meds.

## 2019-01-31 NOTE — Discharge Instructions (Addendum)
We are testing you for Trichomonas, but recommend you start the Metronidazole today.

## 2019-01-31 NOTE — ED Provider Notes (Signed)
[place]    CSN: [id number] Arrival date & time: 01/31/19  1235      History   Chief Complaint Chief Complaint  Patient presents with  . Vaginal Discharge    HPI Madeline Wilkins is a 23 y.o. female.   23 yo established [facility] patient who presents for STD evaluation.  Patient only took 3 days of metronidazole and she feels better, but wants to make sure.  She has a subQ implant for contraception (Nexplanon) in left arm.  On 01/09/2019 patient tested positive for Trichomonas.  No bleeding now.     Past Medical History:  Diagnosis Date  . Gonorrhea   . 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 Infection    UTI  . Kidney stone   . Trichomonal infection   . Urinary tract infection 02/23/2015    Patient Active Problem List   Diagnosis Date Noted  . Pregnancy of unknown anatomic location 01/15/2016    Past Surgical History:  Procedure Laterality Date  . INDUCED ABORTION      OB History    Gravida  2   Para  1   Term  1   Preterm      AB  1   Living  1     SAB      TAB  1   Ectopic      Multiple  0   Live Births  1        Obstetric Comments  G2: mid December 2017 in office D&C (elective AB)         Home Medications    Prior to Admission medications   Medication Sig Start Date End Date Taking? Authorizing Provider  etonogestrel (NEXPLANON) 68 MG IMPL implant 1 each by Subdermal route once. Left arm (10/2015)    [provider]  metroNIDAZOLE (FLAGYL) 500 MG tablet Take 1 tablet (500 mg total) by mouth 2 (two) times daily. 01/31/19   Robyn Haber, MD    Family History Family History  Problem Relation Age of Onset  . Healthy Mother   . Healthy Father   . Cancer Neg Hx   . Diabetes Neg Hx   . Heart disease Neg Hx   . Hypertension Neg Hx   . Stroke Neg  Hx   . Hearing loss Neg Hx   . Asthma Neg Hx     Social History Social History   Tobacco Use  . Smoking status: Never Smoker  . Smokeless tobacco: Never Used  Substance Use Topics  . Alcohol use: No  . Drug use: No     Allergies   Patient has no known allergies.   Review of Systems Review of Systems   Physical Exam Triage Vital Signs ED Triage Vitals  Enc Vitals Group     BP      Pulse      Resp      Temp      Temp src      SpO2      Weight      Height      Head Circumference      Peak Flow      Pain Score      Pain Loc      Pain Edu?      Excl. in  GC?    No data found.  Updated Vital Signs BP 114/76 (BP Location: Right Arm)   Temp 98.9 F (37.2 C) (Oral)   Resp 16   SpO2 96%   Physical Exam Vitals signs and nursing note reviewed.  Constitutional:      Appearance: Normal appearance. She is normal weight.  Eyes:     Conjunctiva/sclera: Conjunctivae normal.  Neck:     Musculoskeletal: Normal range of motion and neck supple.  Cardiovascular:     Rate and Rhythm: Normal rate.  Pulmonary:     Effort: Pulmonary effort is normal.  Musculoskeletal: Normal range of motion.  Skin:    General: Skin is warm and dry.  Neurological:     General: No focal deficit present.     Mental Status: She is alert.  Psychiatric:        Mood and Affect: Mood normal.        Behavior: Behavior normal.      UC Treatments / Results  Labs (all labs ordered are listed, but only abnormal results are displayed) Labs Reviewed  CERVICOVAGINAL ANCILLARY ONLY    EKG   Radiology No results found.  Procedures Procedures (including critical care time)  Medications Ordered in UC Medications - No data to display  Initial Impression / Assessment and Plan / UC Course  I have reviewed the triage vital signs and the nursing notes.  Pertinent labs & imaging results that were available during my care of the patient were reviewed by me and considered in my medical  decision making (see chart for details).    Final Clinical Impressions(s) / UC Diagnoses   Final diagnoses:  STD exposure     Discharge Instructions     We are testing you for Trichomonas, but recommend you start the Metronidazole today.    ED Prescriptions    Medication Sig Dispense Auth. Provider   metroNIDAZOLE (FLAGYL) 500 MG tablet Take 1 tablet (500 mg total) by mouth 2 (two) times daily. 14 tablet Elvina Sidle, MD     I have reviewed the PDMP during this encounter.   Elvina Sidle, MD 01/31/19 1313

## 2019-02-02 LAB — CERVICOVAGINAL ANCILLARY ONLY
Candida vaginitis: NEGATIVE
Chlamydia: NEGATIVE
Neisseria Gonorrhea: NEGATIVE
Trichomonas: NEGATIVE

## 2019-02-04 ENCOUNTER — Telehealth: Payer: Self-pay | Admitting: Obstetrics & Gynecology

## 2019-02-04 NOTE — Telephone Encounter (Signed)
Called the patient to complete the covid screening. The patient stated a nurse called her and informed her the nexplannon should not be removed until May of 2021. She would like to wait until then to schedule the appointment, she would like to cancel for now.

## 2019-02-05 ENCOUNTER — Ambulatory Visit: Payer: Managed Care, Other (non HMO) | Admitting: Obstetrics and Gynecology

## 2019-03-23 ENCOUNTER — Other Ambulatory Visit: Payer: Self-pay

## 2019-03-23 ENCOUNTER — Ambulatory Visit (HOSPITAL_COMMUNITY)
Admission: EM | Admit: 2019-03-23 | Discharge: 2019-03-23 | Disposition: A | Discharge status: Home / Self Care | Payer: Managed Care, Other (non HMO) | Attending: Family Medicine | Admitting: Family Medicine

## 2019-03-23 ENCOUNTER — Encounter (HOSPITAL_COMMUNITY): Payer: Self-pay

## 2019-03-23 DIAGNOSIS — B9689 Other specified bacterial agents as the cause of diseases classified elsewhere: Secondary | ICD-10-CM

## 2019-03-23 DIAGNOSIS — N760 Acute vaginitis: Secondary | ICD-10-CM

## 2019-03-23 DIAGNOSIS — Z113 Encounter for screening for infections with a predominantly sexual mode of transmission: Secondary | ICD-10-CM | POA: Diagnosis not present

## 2019-03-23 MED ORDER — METRONIDAZOLE 500 MG PO TABS: 500.0000 mg | ORAL_TABLET | Freq: Two times a day (BID) | ORAL | 0 refills | Status: DC

## 2019-03-23 MED ORDER — FLUCONAZOLE 150 MG PO TABS: 150.0000 mg | ORAL_TABLET | Freq: Once | ORAL | 0 refills | Status: AC

## 2019-03-23 NOTE — ED Provider Notes (Signed)
[facility]    CSN: [id number] Arrival date & time: 03/23/19  1823      History   Chief Complaint Chief Complaint  Patient presents with  . Appointment  . (6:30 STD Testing)    HPI Madeline Wilkins is a 24 y.o. female.   HPI  Sexually Transmitted Disease Check: Patient presents for sexually transmitted disease check. Sexual history reviewed with the patient. STD exposure:no known direct exposure.  Previous history of infections: bacterial vaginosis, trichomoniasis and candidal infection in November which were successfully treated and resolved per re-test.  Current symptoms include vaginal discharge: thick, no odor, present x 1 week. No itching or irritation present.  Contraception: implantable .  Past Medical History:  Diagnosis Date  . Gonorrhea   . 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 Infection    UTI  . Kidney stone   . Trichomonal infection   . Urinary tract infection 02/23/2015    Patient Active Problem List   Diagnosis Date Noted  . Pregnancy of unknown anatomic location 01/15/2016    Past Surgical History:  Procedure Laterality Date  . INDUCED ABORTION      OB History    Gravida  2   Para  1   Term  1   Preterm      AB  1   Living  1     SAB      TAB  1   Ectopic      Multiple  0   Live Births  1        Obstetric Comments  G2: mid December 2017 in office D&C (elective AB)         Home Medications    Prior to Admission medications   Medication Sig Start Date End Date Taking? Authorizing Provider  etonogestrel (NEXPLANON) 68 MG IMPL implant 1 each by Subdermal route once. Left arm (10/2015)    [provider]  metroNIDAZOLE (FLAGYL) 500 MG tablet Take 1 tablet (500 mg total) by mouth 2 (two) times daily. 01/31/19   January 2018, MD    Family  History Family History  Problem Relation Age of Onset  . Healthy Mother   . Healthy Father   . Cancer Neg Hx   . Diabetes Neg Hx   . Heart disease Neg Hx   . Hypertension Neg Hx   . Stroke Neg Hx   . Hearing loss Neg Hx   . Asthma Neg Hx     Social History Social History   Tobacco Use  . Smoking status: Never Smoker  . Smokeless tobacco: Never Used  Substance Use Topics  . Alcohol use: No  . Drug use: No     Allergies   Patient has no known allergies.   Review of Systems Review of Systems Pertinent negatives listed in HPI Physical Exam Triage Vital Signs ED Triage Vitals [03/23/19 1912]  Enc Vitals Group     BP 123/84     Pulse Rate 72     Resp 18     Temp 98.4 F (36.9 C)     Temp Source Oral     SpO2 100 %     Weight      Height      Head Circumference      Peak Flow  Pain Score 0     Pain Loc      Pain Edu?      Excl. in [place]?    No data found.  Updated Vital Signs BP 123/84 (BP Location: Left Arm)   Pulse 72   Temp 98.4 F (36.9 C) (Oral)   Resp 18   SpO2 100%   Visual Acuity Right Eye Distance:   Left Eye Distance:   Bilateral Distance:    Right Eye Near:   Left Eye Near:    Bilateral Near:     Physical Exam General appearance: alert, well developed, well nourished, cooperative and in no distress Head: Normocephalic, without obvious abnormality, atraumatic Respiratory: Respirations even and unlabored, normal respiratory rate Heart: rate and rhythm normal. No gallop or murmurs noted on exam  Abdomen: BS +, no distention, no rebound tenderness, or no mass Extremities: No gross deformities Skin: Skin color, texture, turgor normal. No rashes seen  Psych: Appropriate mood and affect. Neurologic: Alert, oriented to person, place, and time, thought content appropriate. Genitourinary: Vaginal specimen self collected. UC Treatments / Results  Labs (all labs ordered are listed, but only abnormal results are displayed) Labs Reviewed    POC URINE PREG, ED  CERVICOVAGINAL ANCILLARY ONLY    EKG   Radiology No results found.  Procedures Procedures (including critical care time)  Medications Ordered in UC Medications - No data to display  Initial Impression / Assessment and Plan / UC Course  I have reviewed the triage vital signs and the nursing notes.  Pertinent labs & imaging results that were available during my care of the patient were reviewed by me and considered in my medical decision making (see chart for details).   STD Evaluation and abnormal vaginal discharge. Treating for acute vaginitis with metronidazole and Diflucan. STD-cytology pending. Avoid sexual intercourse for 7 days and use barrier protection with each sexual encounter. Final Clinical Impressions(s) / UC Diagnoses   Final diagnoses:  Screen for STD (sexually transmitted disease)  Acute vaginitis   Discharge Instructions   None    ED Prescriptions    Medication Sig Dispense Auth. Provider   metroNIDAZOLE (FLAGYL) 500 MG tablet Take 1 tablet (500 mg total) by mouth 2 (two) times daily. 14 tablet Scot Jun, FNP   fluconazole (DIFLUCAN) 150 MG tablet Take 1 tablet (150 mg total) by mouth once for 1 dose. Repeat if needed 2 tablet Scot Jun, FNP     PDMP not reviewed this encounter.   Scot Jun, [place] 03/25/19 [phone]

## 2019-03-23 NOTE — ED Triage Notes (Signed)
Pt presents for STD Testing with symptoms of vaginal discharge X 1 week; pt states she has Hx of bacterial vaginosis.

## 2019-03-25 LAB — CERVICOVAGINAL ANCILLARY ONLY
Bacterial vaginitis: POSITIVE — AB
Candida vaginitis: NEGATIVE
Chlamydia: NEGATIVE
Neisseria Gonorrhea: NEGATIVE
Trichomonas: NEGATIVE

## 2019-03-30 ENCOUNTER — Other Ambulatory Visit: Payer: Managed Care, Other (non HMO)

## 2019-06-14 ENCOUNTER — Encounter: Payer: Self-pay | Admitting: Obstetrics and Gynecology

## 2019-06-14 ENCOUNTER — Ambulatory Visit: Payer: Managed Care, Other (non HMO) | Admitting: Obstetrics and Gynecology

## 2019-06-14 NOTE — Progress Notes (Signed)
Patient did not keep her GYN appointment for 06/14/2019.  Cornelia Copa MD Attending Center for [employer] [facility])

## 2019-06-28 ENCOUNTER — Ambulatory Visit (HOSPITAL_COMMUNITY)
Admission: EM | Admit: 2019-06-28 | Discharge: 2019-06-28 | Disposition: A | Discharge status: Home / Self Care | Payer: Managed Care, Other (non HMO) | Attending: Family Medicine | Admitting: Family Medicine

## 2019-06-28 ENCOUNTER — Other Ambulatory Visit: Payer: Self-pay

## 2019-06-28 DIAGNOSIS — K644 Residual hemorrhoidal skin tags: Secondary | ICD-10-CM | POA: Insufficient documentation

## 2019-06-28 DIAGNOSIS — Z3202 Encounter for pregnancy test, result negative: Secondary | ICD-10-CM | POA: Diagnosis not present

## 2019-06-28 DIAGNOSIS — Z113 Encounter for screening for infections with a predominantly sexual mode of transmission: Secondary | ICD-10-CM | POA: Diagnosis present

## 2019-06-28 LAB — POC URINE PREG, ED: Preg Test, Ur: NEGATIVE

## 2019-06-28 LAB — HIV ANTIBODY (ROUTINE TESTING W REFLEX): HIV Screen 4th Generation wRfx: NONREACTIVE

## 2019-06-28 MED ORDER — HYDROCORTISONE (PERIANAL) 2.5 % EX CREA: 1.0000 "application " | TOPICAL_CREAM | Freq: Two times a day (BID) | CUTANEOUS | 0 refills | Status: DC

## 2019-06-28 NOTE — Discharge Instructions (Addendum)
Please apply anusol-hc cream twice daily to rectal area Soak in warm water to help soothe area May try sitting on warm tea bag May use stool softener- colace to help with pain and passing bowels Ensure drinking plenty of fluids/water and eating plenty of fiber  Follow up if area becoming more swollen, painful or seeing any pus  We are testing you for HIV, Syphillis, Gonorrhea, Chlamydia, Trichomonas, Yeast and Bacterial Vaginosis. We will call you if anything is positive and let you know if you require any further treatment. Please inform partners of any positive results.   Please return if symptoms not improving with treatment, development of fever, nausea, vomiting, abdominal pain.

## 2019-06-28 NOTE — ED Triage Notes (Signed)
Pt c/o pain and "something hanging out" of rectum.   Also wants to get checked for STDs.

## 2019-06-29 LAB — CERVICOVAGINAL ANCILLARY ONLY
Bacterial Vaginitis (gardnerella): POSITIVE — AB
Candida Glabrata: NEGATIVE
Candida Vaginitis: NEGATIVE
Chlamydia: NEGATIVE
Comment: NEGATIVE
Comment: NEGATIVE
Comment: NEGATIVE
Comment: NEGATIVE
Comment: NEGATIVE
Comment: NORMAL
Neisseria Gonorrhea: NEGATIVE
Trichomonas: NEGATIVE

## 2019-06-29 LAB — RPR: RPR Ser Ql: NONREACTIVE

## 2019-06-30 ENCOUNTER — Telehealth (HOSPITAL_COMMUNITY): Payer: Self-pay

## 2019-06-30 MED ORDER — METRONIDAZOLE 500 MG PO TABS: 500.0000 mg | ORAL_TABLET | Freq: Two times a day (BID) | ORAL | 0 refills | Status: DC

## 2019-06-30 NOTE — ED Provider Notes (Signed)
[facility]    CSN: [id number] Arrival date & time: 06/28/19  1647      History   Chief Complaint Chief Complaint  Patient presents with  . Hemorrhoids  . Exposure to STD    HPI Madeline Wilkins is a 24 y.o. female no significant past medical history presenting today for evaluation of rectal pain and STD screening.  Patient notes that over the past few days she has developed discomfort to her rectal area and feels as if something is hanging out.  She has had similar sensations previously, but is not as intense as this time.  She denies any bleeding.  Denies any pus.  Bowel movements painful.  Reports typically goes daily or every other day, occasionally needing to strain.  She also would like to be screened for STDs.  She is currently on her menstrual cycle.  Uses Nexplanon for birth control.  HPI  Past Medical History:  Diagnosis Date  . Gonorrhea   . 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 Infection    UTI  . Kidney stone   . Trichomonal infection   . Urinary tract infection 02/23/2015    Patient Active Problem List   Diagnosis Date Noted  . Pregnancy of unknown anatomic location 01/15/2016    Past Surgical History:  Procedure Laterality Date  . INDUCED ABORTION      OB History    Gravida  2   Para  1   Term  1   Preterm      AB  1   Living  1     SAB      TAB  1   Ectopic      Multiple  0   Live Births  1        Obstetric Comments  G2: mid December 2017 in office D&C (elective AB)         Home Medications    Prior to Admission medications   Medication Sig Start Date End Date Taking? Authorizing Provider  etonogestrel (NEXPLANON) 68 MG IMPL implant 1 each by Subdermal route once. Left arm (10/2015)    [provider]  hydrocortisone (ANUSOL-HC) 2.5 %  rectal cream Place 1 application rectally 2 (two) times daily. 06/28/19   Bao Coreas C, PA-C  metroNIDAZOLE (FLAGYL) 500 MG tablet Take 1 tablet (500 mg total) by mouth 2 (two) times daily. 03/23/19   Scot Jun, FNP    Family History Family History  Problem Relation Age of Onset  . Healthy Mother   . Healthy Father   . Cancer Neg Hx   . Diabetes Neg Hx   . Heart disease Neg Hx   . Hypertension Neg Hx   . Stroke Neg Hx   . Hearing loss Neg Hx   . Asthma Neg Hx     Social History Social History   Tobacco Use  . Smoking status: Never Smoker  . Smokeless tobacco: Never Used  Substance Use Topics  . Alcohol use: No  . Drug use: No     Allergies   Patient has no known allergies.   Review of Systems Review of Systems  Constitutional: Negative for fever.  Respiratory: Negative for shortness of breath.   Cardiovascular: Negative for chest pain.  Gastrointestinal: Positive for rectal pain. Negative  for abdominal pain, diarrhea, nausea and vomiting.  Genitourinary: Negative for dysuria, flank pain, genital sores, hematuria, menstrual problem, vaginal bleeding, vaginal discharge and vaginal pain.  Musculoskeletal: Negative for back pain.  Skin: Negative for rash.  Neurological: Negative for dizziness, light-headedness and headaches.     Physical Exam Triage Vital Signs ED Triage Vitals  Enc Vitals Group     BP 06/28/19 1740 137/80     Pulse Rate 06/28/19 1740 73     Resp 06/28/19 1740 16     Temp 06/28/19 1740 98.5 F (36.9 C)     Temp src --      SpO2 06/28/19 1740 100 %     Weight --      Height --      Head Circumference --      Peak Flow --      Pain Score 06/28/19 1741 7     Pain Loc --      Pain Edu? --      Excl. in GC? --    No data found.  Updated Vital Signs BP 137/80   Pulse 73   Temp 98.5 F (36.9 C)   Resp 16   LMP 06/28/2019   SpO2 100%   Visual Acuity Right Eye Distance:   Left Eye Distance:   Bilateral Distance:     Right Eye Near:   Left Eye Near:    Bilateral Near:     Physical Exam Vitals and nursing note reviewed.  Constitutional:      Appearance: She is well-developed.     Comments: No acute distress  HENT:     Head: Normocephalic and atraumatic.     Nose: Nose normal.  Eyes:     Conjunctiva/sclera: Conjunctivae normal.  Cardiovascular:     Rate and Rhythm: Normal rate.  Pulmonary:     Effort: Pulmonary effort is normal. No respiratory distress.  Abdominal:     General: There is no distension.  Genitourinary:    Comments: Vaginal mucosa pink, mild amount of white discharge present  Rectum with small area of enlargement and tenderness, no palpable deformity on DRE, no obvious blood, no surrounding induration or fluctuance Musculoskeletal:        General: Normal range of motion.     Cervical back: Neck supple.  Skin:    General: Skin is warm and dry.  Neurological:     Mental Status: She is alert and oriented to person, place, and time.      UC Treatments / Results  Labs (all labs ordered are listed, but only abnormal results are displayed) Labs Reviewed  CERVICOVAGINAL ANCILLARY ONLY - Abnormal; Notable for the following components:      Result Value   Bacterial Vaginitis (gardnerella) Positive (*)    All other components within normal limits  HIV ANTIBODY (ROUTINE TESTING W REFLEX)  RPR  POC URINE PREG, ED    EKG   Radiology No results found.  Procedures Procedures (including critical care time)  Medications Ordered in UC Medications - No data to display  Initial Impression / Assessment and Plan / UC Course  I have reviewed the triage vital signs and the nursing notes.  Pertinent labs & imaging results that were available during my care of the patient were reviewed by me and considered in my medical decision making (see chart for details).     Rectal exam suggestive of likely external hemorrhoid.  Less suspicious of perirectal abscess at this time,  advised to keep  close monitoring.  Discussed recommendations for hemorrhoids along with providing Anusol HC, sitz bath's.  May use stool softeners.  STD screening pending for vaginal swab and HIV/RPR.  Deferring any empiric treatment.  Discussed strict return precautions. Patient verbalized understanding and is agreeable with plan.  Final Clinical Impressions(s) / UC Diagnoses   Final diagnoses:  External hemorrhoid  Screen for STD (sexually transmitted disease)     Discharge Instructions     Please apply anusol-hc cream twice daily to rectal area Soak in warm water to help soothe area May try sitting on warm tea bag May use stool softener- colace to help with pain and passing bowels Ensure drinking plenty of fluids/water and eating plenty of fiber  Follow up if area becoming more swollen, painful or seeing any pus  We are testing you for HIV, Syphillis, Gonorrhea, Chlamydia, Trichomonas, Yeast and Bacterial Vaginosis. We will call you if anything is positive and let you know if you require any further treatment. Please inform partners of any positive results.   Please return if symptoms not improving with treatment, development of fever, nausea, vomiting, abdominal pain.    ED Prescriptions    Medication Sig Dispense Auth. Provider   hydrocortisone (ANUSOL-HC) 2.5 % rectal cream Place 1 application rectally 2 (two) times daily. 30 g Rayonna Heldman, Hurst C, PA-C     PDMP not reviewed this encounter.   Shanterria Franta, Wynot C, PA-C 06/30/19 1003

## 2019-07-26 ENCOUNTER — Ambulatory Visit (INDEPENDENT_AMBULATORY_CARE_PROVIDER_SITE_OTHER): Payer: Managed Care, Other (non HMO) | Admitting: Obstetrics and Gynecology

## 2019-07-26 ENCOUNTER — Other Ambulatory Visit (HOSPITAL_COMMUNITY)
Admission: RE | Admit: 2019-07-26 | Discharge: 2019-07-26 | Disposition: A | Discharge status: Home / Self Care | Payer: Managed Care, Other (non HMO) | Source: Ambulatory Visit | Attending: Obstetrics and Gynecology | Admitting: Obstetrics and Gynecology

## 2019-07-26 ENCOUNTER — Other Ambulatory Visit: Payer: Self-pay

## 2019-07-26 ENCOUNTER — Encounter: Payer: Self-pay | Admitting: Obstetrics and Gynecology

## 2019-07-26 VITALS — BP 118/84 | HR 84 | Wt 174.3 lb

## 2019-07-26 DIAGNOSIS — Z124 Encounter for screening for malignant neoplasm of cervix: Secondary | ICD-10-CM | POA: Diagnosis present

## 2019-07-26 DIAGNOSIS — Z3046 Encounter for surveillance of implantable subdermal contraceptive: Secondary | ICD-10-CM | POA: Diagnosis not present

## 2019-07-26 DIAGNOSIS — B3731 Acute candidiasis of vulva and vagina: Secondary | ICD-10-CM

## 2019-07-26 DIAGNOSIS — B373 Candidiasis of vulva and vagina: Secondary | ICD-10-CM

## 2019-07-26 HISTORY — PX: REMOVAL OF IMPLANON ROD: OBO 1006

## 2019-07-26 MED ORDER — METRONIDAZOLE 500 MG PO TABS: 500.0000 mg | ORAL_TABLET | Freq: Two times a day (BID) | ORAL | 0 refills | Status: AC

## 2019-07-26 MED ORDER — FLUCONAZOLE 150 MG PO TABS: 150.0000 mg | ORAL_TABLET | Freq: Once | ORAL | 0 refills | Status: AC

## 2019-07-26 NOTE — Procedures (Signed)
Nexplanon Removal Procedure Note Nexplanon expired. Prior to the procedure being performed, the patient was asked to state their full name, date of birth, type of procedure being performed and the exact location of the operative site. This information was then checked against the documentation in the patient's chart. Prior to the procedure being performed, a "time out" was performed by the physician that confirmed the correct patient, procedure and site.  After informed consent was obtained, the patient was placed in dorsal lithotomy and speculum exam done. Normal exam except for copious amounts of white cottage cheee like discharge. Pap smear done. Next, the patient's left arm was examined and the Nexplanon rod was noted to be easily palpable. The area was cleaned with alcohol then local anesthesia was infiltrated with 3 ml of 1% lidocaine with epinephrine. The area was prepped with betadine. Using sterile technique, the Nexplanon device was brought to the incision site. The capsule was scrapped off with the scalpel, the Nexplanon grasped with hemostats, and easily removed; the removal site was hemostatic. The Nexplanon was inspected and noted to be intact.  A steri-strip and a pressure dressing was applied.  The patient tolerated the procedure well.  Patient would like to hold off on anything for birth control.  Diflucan and Flagyl sent in  Cornelia Copa MD Attending Center for [employer] [facility])

## 2019-07-27 LAB — CYTOLOGY - PAP
Adequacy: ABSENT
Chlamydia: NEGATIVE
Comment: NEGATIVE
Comment: NEGATIVE
Comment: NORMAL
Diagnosis: NEGATIVE
Neisseria Gonorrhea: NEGATIVE
Trichomonas: NEGATIVE

## 2019-09-19 ENCOUNTER — Other Ambulatory Visit: Payer: Self-pay

## 2019-09-19 ENCOUNTER — Encounter (HOSPITAL_COMMUNITY): Payer: Self-pay

## 2019-09-19 ENCOUNTER — Ambulatory Visit (HOSPITAL_COMMUNITY)
Admission: EM | Admit: 2019-09-19 | Discharge: 2019-09-19 | Disposition: A | Discharge status: Home / Self Care | Payer: Managed Care, Other (non HMO) | Attending: Physician Assistant | Admitting: Physician Assistant

## 2019-09-19 DIAGNOSIS — R6884 Jaw pain: Secondary | ICD-10-CM | POA: Diagnosis not present

## 2019-09-19 DIAGNOSIS — K047 Periapical abscess without sinus: Secondary | ICD-10-CM | POA: Insufficient documentation

## 2019-09-19 DIAGNOSIS — Z3202 Encounter for pregnancy test, result negative: Secondary | ICD-10-CM | POA: Diagnosis not present

## 2019-09-19 DIAGNOSIS — N898 Other specified noninflammatory disorders of vagina: Secondary | ICD-10-CM | POA: Diagnosis present

## 2019-09-19 DIAGNOSIS — Z113 Encounter for screening for infections with a predominantly sexual mode of transmission: Secondary | ICD-10-CM | POA: Diagnosis not present

## 2019-09-19 LAB — POC URINE PREG, ED: Preg Test, Ur: NEGATIVE

## 2019-09-19 LAB — HIV ANTIBODY (ROUTINE TESTING W REFLEX): HIV Screen 4th Generation wRfx: NONREACTIVE

## 2019-09-19 MED ORDER — ACETAMINOPHEN 500 MG PO TABS: 1000.0000 mg | ORAL_TABLET | Freq: Three times a day (TID) | ORAL | 0 refills | Status: DC | PRN

## 2019-09-19 MED ORDER — IBUPROFEN 600 MG PO TABS: 600.0000 mg | ORAL_TABLET | Freq: Four times a day (QID) | ORAL | 0 refills | Status: DC | PRN

## 2019-09-19 MED ORDER — METRONIDAZOLE 500 MG PO TABS: 500.0000 mg | ORAL_TABLET | Freq: Two times a day (BID) | ORAL | 0 refills | Status: DC

## 2019-09-19 MED ORDER — BENZOCAINE 10 % MT GEL: 1.0000 "application " | OROMUCOSAL | 0 refills | Status: DC | PRN

## 2019-09-19 MED ORDER — ACETAMINOPHEN 500 MG PO TABS: 1000.0000 mg | ORAL_TABLET | Freq: Four times a day (QID) | ORAL | 0 refills | Status: DC | PRN

## 2019-09-19 MED ORDER — AMOXICILLIN-POT CLAVULANATE 875-125 MG PO TABS: 1.0000 | ORAL_TABLET | Freq: Two times a day (BID) | ORAL | 0 refills | Status: AC

## 2019-09-19 NOTE — Discharge Instructions (Addendum)
Take medicines as prescribed Oragel as needed  Schedule with your dentist  [facility]  We have sent your labs, we will call if we need to change treatment

## 2019-09-19 NOTE — ED Triage Notes (Signed)
Pt presents with lefts sided jaw pain x 4 days. Pt has not take any medication for the pain.    Pt requested STD's testing.

## 2019-09-19 NOTE — ED Provider Notes (Signed)
[facility]    CSN: [id number] Arrival date & time: 09/19/19  1426      History   Chief Complaint Chief Complaint  Patient presents with  . Jaw Pain  . std test    HPI Madeline Wilkins is a 24 y.o. female.   Patient reports for 2 concerns.  Her first is 4-day history of left jaw pain. Her other is vaginal discharge and requesting STD testing. She reports 4 days ago she had some swelling between her left lower teeth and cheek. This then became painful with a "white spot". It has remained quite painful since then. She reports pain with chewing and drinking. She reports pain with touching her left cheek. She has a dental appointment in 2 weeks for same. Denies fever, chills, difficulty swallowing, neck pain.  With regard to her vaginal discharge. This has been increasing over a few days. She describes "white and creamy". She states this is consistent with BV for her. She reports some itching. Denies vaginal pain or bleeding. Denies belly pain. Denies painful, frequent or urgent urination. She request HIV, syphillis and other STD testing. She reports she gets frequent BV infections and does not know why.      Past Medical History:  Diagnosis Date  . Gonorrhea   . 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 Infection    UTI  . Kidney stone   . Trichomonal infection   . Urinary tract infection 02/23/2015    Patient Active Problem List   Diagnosis Date Noted  . Pregnancy of unknown anatomic location 01/15/2016    Past Surgical History:  Procedure Laterality Date  . INDUCED ABORTION    . REMOVAL OF IMPLANON ROD  07/26/2019    OB History    Gravida  2   Para  1   Term  1   Preterm      AB  1   Living  1     SAB      TAB  1   Ectopic      Multiple  0   Live Births  1        Obstetric  Comments  G2: mid December 2017 in office D&C (elective AB)         Home Medications    Prior to Admission medications   Medication Sig Start Date End Date Taking? Authorizing Provider  acetaminophen (TYLENOL) 500 MG tablet Take 2 tablets (1,000 mg total) by mouth every 8 (eight) hours as needed. 09/19/19   Lyn Deemer, 07/28/2019, PA-C  amoxicillin-clavulanate (AUGMENTIN) 875-125 MG tablet Take 1 tablet by mouth 2 (two) times daily for 7 days. 09/19/19 09/26/19  Nikeia Henkes, Veryl Speak, PA-C  benzocaine (ORAJEL) 10 % mucosal gel Use as directed 1 application in the mouth or throat as needed for mouth pain. 09/19/19   Bascom Biel, 09/28/19, PA-C  etonogestrel (NEXPLANON) 68 MG IMPL implant 1 each by Subdermal route once. Left arm (10/2015)    [provider]  hydrocortisone (ANUSOL-HC) 2.5 % rectal cream Place 1 application rectally 2 (two) times daily. Patient not taking: Reported on 07/26/2019 06/28/19   Wieters, Hallie C, PA-C  ibuprofen (ADVIL) 600 MG tablet Take 1 tablet (600 mg total) by mouth every 6 (six) hours as needed. 09/19/19   Kendarious Gudino, 07/28/2019, PA-C  metroNIDAZOLE (FLAGYL) 500 MG tablet Take 1 tablet (500 mg total) by mouth 2 (two) times daily. 09/19/19   Valmai Vandenberghe, Veryl Speak, PA-C    Family History Family History  Problem Relation Age of Onset  . Healthy Mother   . Healthy Father   . Cancer Neg Hx   . Diabetes Neg Hx   . Heart disease Neg Hx   . Hypertension Neg Hx   . Stroke Neg Hx   . Hearing loss Neg Hx   . Asthma Neg Hx     Social History Social History   Tobacco Use  . Smoking status: Never Smoker  . Smokeless tobacco: Never Used  Vaping Use  . Vaping Use: Never used  Substance Use Topics  . Alcohol use: No  . Drug use: No     Allergies   Patient has no known allergies.   Review of Systems Review of Systems   Physical Exam Triage Vital Signs ED Triage Vitals  Enc Vitals Group     BP 09/19/19 1502 117/73     Pulse Rate 09/19/19 1502 71     Resp 09/19/19 1502 16     Temp  09/19/19 1502 98.5 F (36.9 C)     Temp Source 09/19/19 1502 Oral     SpO2 09/19/19 1502 100 %     Weight --      Height --      Head Circumference --      Peak Flow --      Pain Score 09/19/19 1501 10     Pain Loc --      Pain Edu? --      Excl. in GC? --    No data found.  Updated Vital Signs BP 117/73 (BP Location: Right Arm)   Pulse 71   Temp 98.5 F (36.9 C) (Oral)   Resp 16   LMP  (Within Days) Comment: 5 days  SpO2 100%   Visual Acuity Right Eye Distance:   Left Eye Distance:   Bilateral Distance:    Right Eye Near:   Left Eye Near:    Bilateral Near:     Physical Exam Vitals and nursing note reviewed.  Constitutional:      General: She is not in acute distress.    Appearance: She is well-developed.  HENT:     Head: Normocephalic and atraumatic.     Mouth/Throat:     Comments: There is and area of slight swelling, erythema with a punctate white center along the left mandibular bucal mucosa. There does appear to be some swelling to the gingiva here. Very tender, both directly and with external pressure. Mild submandibular TTP, no significant swelling. No submental swelling.    Dentition overall good.  Eyes:     Conjunctiva/sclera: Conjunctivae normal.  Cardiovascular:     Rate and Rhythm: Normal rate.  Pulmonary:     Effort: Pulmonary effort is normal. No respiratory distress.  Abdominal:     Palpations: Abdomen is soft.     Tenderness: There is no abdominal tenderness. There is no right CVA tenderness or left CVA tenderness.  Genitourinary:    Comments: Deferred for self swab Musculoskeletal:     Cervical back: Neck supple.  Skin:    General: Skin is warm and dry.  Neurological:     Mental Status: She is alert.      UC Treatments / Results  Labs (all labs ordered are listed, but only abnormal results are displayed) Labs Reviewed  HIV ANTIBODY (ROUTINE TESTING W REFLEX)  RPR  POC URINE PREG, ED  CERVICOVAGINAL ANCILLARY ONLY     EKG   Radiology No results found.  Procedures Procedures (including critical care time)  Medications Ordered in UC Medications - No data to display  Initial Impression / Assessment and Plan / UC Course  I have reviewed the triage vital signs and the nursing notes.  Pertinent labs & imaging results that were available during my care of the patient were reviewed by me and considered in my medical decision making (see chart for details).     #Dental infection #vaginal discharge #STD testing Patient is a health 24 year old female presenting with suspected dental infection and vaginal discharge. Clinical history and exam findings represent possible rupture small abscess, given continued pain will treat with augmentin given upcoming dental appointment. Oragel for topical relief, ibuprofen and tylenol as well. Given patient reports this is consistent with BV for her, we will start metronidazole. We discussed delay until results, but patient opts to initiate treatment. Swab for GC/CT, trich, bv , yeast sent. HIV and RPR sent. Discussed use of unscented plain soaps. Patient to follow up with dentist. Ob/gyn options given. Return and follow up precautions discussed. Patient verablized understanding plan of care.  Final Clinical Impressions(s) / UC Diagnoses   Final diagnoses:  Vaginal discharge  Dental infection  Screen for STD (sexually transmitted disease)     Discharge Instructions     Take medicines as prescribed Oragel as needed  Schedule with your dentist  [facility]  We have sent your labs, we will call if we need to change treatment      ED Prescriptions    Medication Sig Dispense Auth. Provider   amoxicillin-clavulanate (AUGMENTIN) 875-125 MG tablet Take 1 tablet by mouth 2 (two) times daily for 7 days. 14 tablet Mersedes Alber, Veryl Speak, PA-C   metroNIDAZOLE (FLAGYL) 500 MG tablet Take 1 tablet (500 mg total) by mouth 2  (two) times daily. 14 tablet Mirza Kidney, Veryl Speak, PA-C   benzocaine (ORAJEL) 10 % mucosal gel Use as directed 1 application in the mouth or throat as needed for mouth pain. 5.3 g Meggin Ola, Veryl Speak, PA-C   ibuprofen (ADVIL) 600 MG tablet Take 1 tablet (600 mg total) by mouth every 6 (six) hours as needed. 30 tablet Eusebio Blazejewski, Veryl Speak, PA-C   acetaminophen (TYLENOL) 500 MG tablet  (Status: Discontinued) Take 2 tablets (1,000 mg total) by mouth every 6 (six) hours as needed. 30 tablet Cheston Coury, Veryl Speak, PA-C   acetaminophen (TYLENOL) 500 MG tablet Take 2 tablets (1,000 mg total) by mouth every 8 (eight) hours as needed. 30 tablet Tilly Pernice, Veryl Speak, PA-C     PDMP not reviewed this encounter.   Hermelinda Medicus, PA-C 09/19/19 2204

## 2019-09-20 LAB — CERVICOVAGINAL ANCILLARY ONLY
Bacterial Vaginitis (gardnerella): POSITIVE — AB
Candida Glabrata: NEGATIVE
Candida Vaginitis: NEGATIVE
Chlamydia: NEGATIVE
Comment: NEGATIVE
Comment: NEGATIVE
Comment: NEGATIVE
Comment: NEGATIVE
Comment: NEGATIVE
Comment: NORMAL
Neisseria Gonorrhea: NEGATIVE
Trichomonas: NEGATIVE

## 2019-09-20 LAB — RPR: RPR Ser Ql: NONREACTIVE

## 2019-11-04 ENCOUNTER — Ambulatory Visit (HOSPITAL_COMMUNITY): Admit: 2019-11-04 | Discharge status: Against Medical Advice | Payer: Managed Care, Other (non HMO)

## 2019-11-05 ENCOUNTER — Ambulatory Visit (HOSPITAL_COMMUNITY): Payer: Self-pay

## 2019-11-24 ENCOUNTER — Other Ambulatory Visit: Payer: Self-pay

## 2019-11-24 ENCOUNTER — Ambulatory Visit
Admission: EM | Admit: 2019-11-24 | Discharge: 2019-11-24 | Disposition: A | Discharge status: Home / Self Care | Payer: Self-pay | Attending: Family Medicine | Admitting: Family Medicine

## 2019-11-24 DIAGNOSIS — R102 Pelvic and perineal pain: Secondary | ICD-10-CM

## 2019-11-24 LAB — POCT URINALYSIS DIP (MANUAL ENTRY)
Bilirubin, UA: NEGATIVE
Blood, UA: NEGATIVE
Glucose, UA: NEGATIVE mg/dL
Ketones, POC UA: NEGATIVE mg/dL
Leukocytes, UA: NEGATIVE
Nitrite, UA: NEGATIVE
Protein Ur, POC: NEGATIVE mg/dL
Spec Grav, UA: >=1.03 — AB (ref 1.010–1.025)
Urobilinogen, UA: 0.2 E.U./dL
pH, UA: 7 (ref 5.0–8.0)

## 2019-11-24 LAB — POCT URINE PREGNANCY: Preg Test, Ur: NEGATIVE

## 2019-11-24 MED ORDER — TRAMADOL HCL 50 MG PO TABS: 50.0000 mg | ORAL_TABLET | Freq: Three times a day (TID) | ORAL | 0 refills | Status: DC | PRN

## 2019-11-24 MED ORDER — KETOROLAC TROMETHAMINE 60 MG/2ML IM SOLN
60.0000 mg | Freq: Once | INTRAMUSCULAR | Status: AC
  Administered 2019-11-24: 60 mg via INTRAMUSCULAR
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
## 2019-11-24 NOTE — Discharge Instructions (Addendum)
Recommend follow-up tomorrow at [facility] [address] [place], [place] [address] Hours:  Closed ? Opens 8:15AM Thu Phone: [phone]  I would only go to the Emergency Department if pain doesn't improve or if you develop any bleeding.

## 2019-11-24 NOTE — ED Provider Notes (Signed)
[facility]    CSN: [id number] Arrival date & time: 11/24/19  1734      History   Chief Complaint Chief Complaint  Patient presents with  . Abdominal Pain    experienced pain during intercourse    HPI Madeline Wilkins is a 24 y.o. female.   HPI  Patient present for evaluation of pelvis, low back pain and vaginal discomfort following having sexual intercourse 30-45 minutes prior to arrival here at [facility]. Patient denies any bleeding, however endorses sharp pain during. Reports with the initiation of intercourse there was no pain however with increased friction she developed sharp shooting type pain which she endorses radiated to her pelvis around her back. She came here immediately following episode and has been in 10 of 10 pain since that time. She has not taken anything for pain.     Past Medical History:  Diagnosis Date  . Gonorrhea   . 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 Infection    UTI  . Kidney stone   . Trichomonal infection   . Urinary tract infection 02/23/2015    Patient Active Problem List   Diagnosis Date Noted  . Pregnancy of unknown anatomic location 01/15/2016    Past Surgical History:  Procedure Laterality Date  . INDUCED ABORTION    . REMOVAL OF IMPLANON ROD  07/26/2019    OB History    Gravida  2   Para  1   Term  1   Preterm      AB  1   Living  1     SAB      TAB  1   Ectopic      Multiple  0   Live Births  1        Obstetric Comments  G2: mid December 2017 in office D&C (elective AB)         Home Medications    Prior to Admission medications   Medication Sig Start Date End Date Taking? Authorizing Provider  acetaminophen (TYLENOL) 500 MG tablet Take 2 tablets (1,000 mg total) by mouth every 8 (eight) hours as needed. 09/19/19  Yes Darr, 07/28/2019, PA-C  benzocaine (ORAJEL) 10 % mucosal gel Use as directed 1 application in the mouth or throat as needed for mouth pain. 09/19/19   Darr, 09/21/19, PA-C  etonogestrel (NEXPLANON) 68 MG IMPL implant 1 each by Subdermal route once. Left arm (10/2015)    [provider]  hydrocortisone (ANUSOL-HC) 2.5 % rectal cream Place 1 application rectally 2 (two) times daily. Patient not taking: Reported on 07/26/2019 06/28/19   Wieters, Hallie C, PA-C  ibuprofen (ADVIL) 600 MG tablet Take 1 tablet (600 mg total) by mouth every 6 (six) hours as needed. 09/19/19   Darr, 07/28/2019, PA-C  metroNIDAZOLE (FLAGYL) 500 MG tablet Take 1 tablet (500 mg total) by mouth 2 (two) times daily. 09/19/19   Darr, 09/21/19, PA-C    Family History Family History  Problem Relation Age of Onset  . Healthy Mother   . Healthy Father   . Cancer Neg Hx   . Diabetes Neg Hx   . Heart disease Neg Hx   . Hypertension Neg Hx   . Stroke Neg Hx   . Hearing loss Neg Hx   .  Asthma Neg Hx     Social History Social History   Tobacco Use  . Smoking status: Never Smoker  . Smokeless tobacco: Never Used  Vaping Use  . Vaping Use: Never used  Substance Use Topics  . Alcohol use: No  . Drug use: No     Allergies   Patient has no known allergies.   Review of Systems Review of Systems Pertinent negatives listed in HPI  Physical Exam Triage Vital Signs ED Triage Vitals  Enc Vitals Group     BP 11/24/19 1748 124/80     Pulse Rate 11/24/19 1748 90     Resp 11/24/19 1748 (!) 21     Temp 11/24/19 1748 98.4 F (36.9 C)     Temp Source 11/24/19 1748 Oral     SpO2 11/24/19 1748 98 %     Weight --      Height --      Head Circumference --      Peak Flow --      Pain Score 11/24/19 1749 10     Pain Loc --      Pain Edu? --      Excl. in GC? --    No data found.  Updated Vital Signs BP 124/80 (BP Location: Right Arm)   Pulse 90   Temp 98.4 F (36.9 C) (Oral)   Resp (!) 21   LMP  (LMP Unknown)   SpO2 98%    Visual Acuity Right Eye Distance:   Left Eye Distance:   Bilateral Distance:    Right Eye Near:   Left Eye Near:    Bilateral Near:     Physical Exam General appearance: alert, well developed, well nourished, acute distress present Head: Normocephalic, without obvious abnormality, atraumatic Respiratory: Respirations even and unlabored, normal respiratory rate Heart: rate and rhythm normal. No gallop or murmurs noted on exam  Abdomen: bilateral lower abdominal pain with light palpation Bilateral flank pain with palpation  Skin: Skin color, texture, turgor normal. No rashes seen  Psych: Anxiousness present.  UC Treatments / Results  Labs (all labs ordered are listed, but only abnormal results are displayed) Labs Reviewed - No data to display  EKG   Radiology No results found.  Procedures Procedures (including critical care time)  Medications Ordered in UC Medications - No data to display  Initial Impression / Assessment and Plan / UC Course  I have reviewed the triage vital signs and the nursing notes.  Pertinent labs & imaging results that were available during my care of the patient were reviewed by me and considered in my medical decision making (see chart for details).     Checked a UA and hCG pregnancy both unremarkable.  Advised patient to follow-up at [facility] tomorrow for evaluation.  Patient is insistent on going to the ER tonight however advised that given current wait times at the ER she would likely have to wait a prolonged period of time before she is evaluated and recommended highly trialing pain management conservative measures to resolve pain.  Patient given reassurance as she is not bleeding and pain will likely improve with warm soaks NSAIDS, and tramadol. Final Clinical Impressions(s) / UC Diagnoses   Final diagnoses:  Acute pelvic pain, following sexual intercourse  Vaginal pain following sex     Discharge Instructions      Recommend follow-up tomorrow at [facility] [address] [place], [place] [address] Hours:  Closed ? Opens 8:15AM Thu  Phone: [phone]  I would only go to the Emergency Department if pain doesn't improve or if you develop any bleeding.     ED Prescriptions    Medication Sig Dispense Auth. Provider   traMADol (ULTRAM) 50 MG tablet Take 1-2 tablets (50-100 mg total) by mouth every 8 (eight) hours as needed for severe pain. 10 tablet Bing Neighbors, FNP     PDMP not reviewed this encounter.   Bing Neighbors, FNP 11/24/19 1928

## 2019-11-25 ENCOUNTER — Ambulatory Visit (HOSPITAL_COMMUNITY): Payer: Self-pay

## 2019-12-04 ENCOUNTER — Encounter (HOSPITAL_COMMUNITY): Payer: Self-pay

## 2019-12-04 ENCOUNTER — Ambulatory Visit (HOSPITAL_COMMUNITY)
Admission: RE | Admit: 2019-12-04 | Discharge: 2019-12-04 | Disposition: A | Discharge status: Home / Self Care | Payer: Self-pay | Source: Ambulatory Visit | Attending: Emergency Medicine | Admitting: Emergency Medicine

## 2019-12-04 ENCOUNTER — Other Ambulatory Visit: Payer: Self-pay

## 2019-12-04 VITALS — BP 105/70 | HR 83 | Temp 98.6°F | Resp 16

## 2019-12-04 DIAGNOSIS — Z113 Encounter for screening for infections with a predominantly sexual mode of transmission: Secondary | ICD-10-CM | POA: Insufficient documentation

## 2019-12-04 DIAGNOSIS — N898 Other specified noninflammatory disorders of vagina: Secondary | ICD-10-CM | POA: Insufficient documentation

## 2019-12-04 DIAGNOSIS — Z3202 Encounter for pregnancy test, result negative: Secondary | ICD-10-CM

## 2019-12-04 LAB — POCT URINALYSIS DIPSTICK, ED / UC
Bilirubin Urine: NEGATIVE
Glucose, UA: NEGATIVE mg/dL
Hgb urine dipstick: NEGATIVE
Ketones, ur: NEGATIVE mg/dL
Leukocytes,Ua: NEGATIVE
Nitrite: NEGATIVE
Protein, ur: NEGATIVE mg/dL
Specific Gravity, Urine: >=1.03 (ref 1.005–1.030)
Urobilinogen, UA: 0.2 mg/dL (ref 0.0–1.0)
pH: 5.5 (ref 5.0–8.0)

## 2019-12-04 LAB — POC URINE PREG, ED: Preg Test, Ur: NEGATIVE

## 2019-12-04 MED ORDER — METRONIDAZOLE 500 MG PO TABS: 500.0000 mg | ORAL_TABLET | Freq: Two times a day (BID) | ORAL | 0 refills | Status: DC

## 2019-12-04 NOTE — ED Provider Notes (Signed)
[facility]    CSN: [id number] Arrival date & time: 12/04/19  1351      History   Chief Complaint Chief Complaint  Patient presents with  . Appointment    2 pm appt  . SEXUALLY TRANSMITTED DISEASE    HPI Madeline Wilkins is a 24 y.o. female presenting today for STD screening and vaginal discharge.  Patient reports that she has had vaginal discharge and symptoms similar to prior BV infections.  She is concerned about possible STD exposure as on 9/22 was having intercourse and had immediate pain.  Pain has resolved.  Has plans to follow-up with OB/GYN.  Last HIV screening in July.  HPI  Past Medical History:  Diagnosis Date  . Gonorrhea   . 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 Infection    UTI  . Kidney stone   . Trichomonal infection   . Urinary tract infection 02/23/2015    Patient Active Problem List   Diagnosis Date Noted  . Pregnancy of unknown anatomic location 01/15/2016    Past Surgical History:  Procedure Laterality Date  . INDUCED ABORTION    . REMOVAL OF IMPLANON ROD  07/26/2019    OB History    Gravida  2   Para  1   Term  1   Preterm      AB  1   Living  1     SAB      TAB  1   Ectopic      Multiple  0   Live Births  1        Obstetric Comments  G2: mid December 2017 in office D&C (elective AB)         Home Medications    Prior to Admission medications   Medication Sig Start Date End Date Taking? Authorizing Provider  benzocaine (ORAJEL) 10 % mucosal gel Use as directed 1 application in the mouth or throat as needed for mouth pain. 09/19/19   Darr, 07/28/2019, PA-C  etonogestrel (NEXPLANON) 68 MG IMPL implant 1 each by Subdermal route once. Left arm (10/2015)    [provider]  metroNIDAZOLE (FLAGYL) 500 MG tablet Take 1 tablet (500 mg total) by  mouth 2 (two) times daily. 12/04/19   Stefany Starace, Veryl Speak, PA-C    Family History Family History  Problem Relation Age of Onset  . Healthy Mother   . Healthy Father   . Cancer Neg Hx   . Diabetes Neg Hx   . Heart disease Neg Hx   . Hypertension Neg Hx   . Stroke Neg Hx   . Hearing loss Neg Hx   . Asthma Neg Hx     Social History Social History   Tobacco Use  . Smoking status: Never Smoker  . Smokeless tobacco: Never Used  Vaping Use  . Vaping Use: Never used  Substance Use Topics  . Alcohol use: No  . Drug use: No     Allergies   Patient has no known allergies.   Review of Systems Review of Systems  Constitutional: Negative for fever.  Respiratory: Negative for shortness of breath.   Cardiovascular: Negative for chest pain.  Gastrointestinal: Negative for abdominal pain, diarrhea, nausea and vomiting.  Genitourinary: Positive for vaginal discharge. Negative for dysuria, flank pain, genital sores,  hematuria, menstrual problem, vaginal bleeding and vaginal pain.  Musculoskeletal: Negative for back pain.  Skin: Negative for rash.  Neurological: Negative for dizziness, light-headedness and headaches.     Physical Exam Triage Vital Signs ED Triage Vitals  Enc Vitals Group     BP      Pulse      Resp      Temp      Temp src      SpO2      Weight      Height      Head Circumference      Peak Flow      Pain Score      Pain Loc      Pain Edu?      Excl. in GC?    No data found.  Updated Vital Signs BP 105/70 (BP Location: Right Arm)   Pulse 83   Temp 98.6 F (37 C) (Oral)   Resp 16   LMP 11/04/2019   SpO2 96%   Visual Acuity Right Eye Distance:   Left Eye Distance:   Bilateral Distance:    Right Eye Near:   Left Eye Near:    Bilateral Near:     Physical Exam Vitals and nursing note reviewed.  Constitutional:      Appearance: She is well-developed.     Comments: No acute distress  HENT:     Head: Normocephalic and atraumatic.     Nose:  Nose normal.  Eyes:     Conjunctiva/sclera: Conjunctivae normal.  Cardiovascular:     Rate and Rhythm: Normal rate.  Pulmonary:     Effort: Pulmonary effort is normal. No respiratory distress.  Abdominal:     General: There is no distension.     Comments: Soft, nondistended, nontender light deep palpation throughout abdomen  Musculoskeletal:        General: Normal range of motion.     Cervical back: Neck supple.  Skin:    General: Skin is warm and dry.  Neurological:     Mental Status: She is alert and oriented to person, place, and time.      UC Treatments / Results  Labs (all labs ordered are listed, but only abnormal results are displayed) Labs Reviewed  POC URINE PREG, ED  POCT URINALYSIS DIPSTICK, ED / UC  CERVICOVAGINAL ANCILLARY ONLY    EKG   Radiology No results found.  Procedures Procedures (including critical care time)  Medications Ordered in UC Medications - No data to display  Initial Impression / Assessment and Plan / UC Course  I have reviewed the triage vital signs and the nursing notes.  Pertinent labs & imaging results that were available during my care of the patient were reviewed by me and considered in my medical decision making (see chart for details).     Pregnancy test negative, UA unremarkable, vaginal swab pending for STD screening.  Deferring HIV/RPR as this was recently done less than 3 months ago.  Will call with results and provide further treatment as needed.  Empirically treating for BV today with Flagyl.  Discussed strict return precautions. Patient verbalized understanding and is agreeable with plan.  Final Clinical Impressions(s) / UC Diagnoses   Final diagnoses:  Vaginal discharge  Screen for STD (sexually transmitted disease)     Discharge Instructions     Metronidazole twice daily for 1 week, no alcohol while taking  We are testing you for Gonorrhea, Chlamydia, Trichomonas, Yeast and Bacterial Vaginosis. We will  call you if anything is positive and let you know if you require any further treatment. Please inform partners of any positive results.   Please return if symptoms not improving with treatment, development of fever, nausea, vomiting, abdominal pain.     ED Prescriptions    Medication Sig Dispense Auth. Provider   metroNIDAZOLE (FLAGYL) 500 MG tablet Take 1 tablet (500 mg total) by mouth 2 (two) times daily. 14 tablet Amillion Scobee, [place] C, PA-C     PDMP not reviewed this encounter.   Lew Dawes, [place] 12/04/19 1449

## 2019-12-04 NOTE — ED Triage Notes (Signed)
Seen 9/22.  Patient reports pain after a sexual encounter.  Std and hiv work up

## 2019-12-04 NOTE — Discharge Instructions (Signed)
Metronidazole twice daily for 1 week, no alcohol while taking  We are testing you for Gonorrhea, Chlamydia, Trichomonas, Yeast and Bacterial Vaginosis. We will call you if anything is positive and let you know if you require any further treatment. Please inform partners of any positive results.   Please return if symptoms not improving with treatment, development of fever, nausea, vomiting, abdominal pain.

## 2019-12-06 ENCOUNTER — Telehealth (HOSPITAL_COMMUNITY): Payer: Self-pay

## 2019-12-06 LAB — CERVICOVAGINAL ANCILLARY ONLY
Bacterial Vaginitis (gardnerella): POSITIVE — AB
Candida Glabrata: NEGATIVE
Candida Vaginitis: POSITIVE — AB
Chlamydia: POSITIVE — AB
Comment: NEGATIVE
Comment: NEGATIVE
Comment: NEGATIVE
Comment: NEGATIVE
Comment: NEGATIVE
Comment: NORMAL
Neisseria Gonorrhea: NEGATIVE
Trichomonas: NEGATIVE

## 2019-12-06 MED ORDER — FLUCONAZOLE 150 MG PO TABS: 150.0000 mg | ORAL_TABLET | Freq: Every day | ORAL | 0 refills | Status: AC

## 2019-12-06 MED ORDER — AZITHROMYCIN 250 MG PO TABS: 1000.0000 mg | ORAL_TABLET | Freq: Once | ORAL | 0 refills | Status: AC

## 2019-12-06 MED ORDER — METRONIDAZOLE 500 MG PO TABS: 500.0000 mg | ORAL_TABLET | Freq: Two times a day (BID) | ORAL | 0 refills | Status: DC

## 2019-12-06 NOTE — Telephone Encounter (Signed)
Pt presented to urgent care for rn to go over std results with her. Patient verbalized understanding, medication needed sent to pharmacy on file per protocol.

## 2019-12-17 ENCOUNTER — Other Ambulatory Visit: Payer: Self-pay

## 2019-12-17 ENCOUNTER — Encounter (HOSPITAL_COMMUNITY): Payer: Self-pay

## 2019-12-17 ENCOUNTER — Ambulatory Visit (HOSPITAL_COMMUNITY)
Admission: RE | Admit: 2019-12-17 | Discharge: 2019-12-17 | Disposition: A | Discharge status: Home / Self Care | Payer: Self-pay | Source: Ambulatory Visit | Attending: Family Medicine | Admitting: Family Medicine

## 2019-12-17 VITALS — BP 113/69 | HR 100 | Temp 98.7°F | Resp 16

## 2019-12-17 DIAGNOSIS — A64 Unspecified sexually transmitted disease: Secondary | ICD-10-CM

## 2019-12-17 LAB — HIV ANTIBODY (ROUTINE TESTING W REFLEX): HIV Screen 4th Generation wRfx: NONREACTIVE

## 2019-12-17 MED ORDER — FLUCONAZOLE 150 MG PO TABS: 150.0000 mg | ORAL_TABLET | Freq: Every day | ORAL | 0 refills | Status: DC

## 2019-12-17 MED ORDER — METRONIDAZOLE 500 MG PO TABS: 500.0000 mg | ORAL_TABLET | Freq: Two times a day (BID) | ORAL | 0 refills | Status: DC

## 2019-12-17 MED ORDER — AZITHROMYCIN 250 MG PO TABS
ORAL_TABLET | ORAL | Status: AC
  Filled 2019-12-17: qty 4

## 2019-12-17 MED ORDER — AZITHROMYCIN 250 MG PO TABS
1000.0000 mg | ORAL_TABLET | Freq: Once | ORAL | Status: AC
  Administered 2019-12-17: 1000 mg via ORAL

## 2019-12-17 NOTE — ED Triage Notes (Signed)
Pt seen and tested on 12/04/19 but never received treatment. States did not know RX was sent to pharmacy. Would also like HIV testing.

## 2019-12-17 NOTE — Discharge Instructions (Signed)
I have sent medicine to the pharmacy for you Metronidazole, 1 pill 3 times a day for 7 days we will treat BV Diflucan, 1 pill now and then 1 pill in 7 days to treat yeast infection The pills in the office today will get rid of chlamydia Check my chart for your blood test results

## 2019-12-17 NOTE — ED Provider Notes (Signed)
[facility]    CSN: [id number] Arrival date & time: 12/17/19  1001      History   Chief Complaint No chief complaint on file.   HPI Madeline Wilkins is a 24 y.o. female.   HPI  Patient was seen here on 12/04/2019.  She had a vaginal swab done at that time.  Tested positive for chlamydia, BV, and Candida.  She has not yet been treated for these.  She did speak with the nurse, and medicine was sent to her pharmacy for her.  She was not aware that the medicines had been sent, and has not yet been treated She denies any abdominal pain, vaginal discharge, discomfort.  No fever or chills Patient also would like to have blood test for HIV and RPR.  She did get at the time of her last visit, because she did not think she had an STD.  Now she would like to be tested  Past Medical History:  Diagnosis Date  . Gonorrhea   . 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 Infection    UTI  . Kidney stone   . Trichomonal infection   . Urinary tract infection 02/23/2015    Patient Active Problem List   Diagnosis Date Noted  . Pregnancy of unknown anatomic location 01/15/2016    Past Surgical History:  Procedure Laterality Date  . INDUCED ABORTION    . REMOVAL OF IMPLANON ROD  07/26/2019    OB History    Gravida  2   Para  1   Term  1   Preterm      AB  1   Living  1     SAB      TAB  1   Ectopic      Multiple  0   Live Births  1        Obstetric Comments  G2: mid December 2017 in office D&C (elective AB)         Home Medications    Prior to Admission medications   Medication Sig Start Date End Date Taking? Authorizing Provider  benzocaine (ORAJEL) 10 % mucosal gel Use as directed 1 application in the mouth or throat as needed for mouth pain. 09/19/19   Darr, 07/28/2019, PA-C  etonogestrel  (NEXPLANON) 68 MG IMPL implant 1 each by Subdermal route once. Left arm (10/2015)    [provider]  fluconazole (DIFLUCAN) 150 MG tablet Take 1 tablet (150 mg total) by mouth daily. Repeat in 1 week if needed 12/17/19   Veryl Speak, MD  metroNIDAZOLE (FLAGYL) 500 MG tablet Take 1 tablet (500 mg total) by mouth 2 (two) times daily. 12/17/19   12/19/19, MD    Family History Family History  Problem Relation Age of Onset  . Healthy Mother   . Healthy Father   . Cancer Neg Hx   . Diabetes Neg Hx   . Heart disease Neg Hx   . Hypertension Neg Hx   . Stroke Neg Hx   . Hearing loss Neg Hx   . Asthma Neg Hx     Social History Social History   Tobacco Use  . Smoking status: Never Smoker  . Smokeless tobacco: Never Used  Vaping Use  . Vaping Use: Never used  Substance  Use Topics  . Alcohol use: No  . Drug use: No     Allergies   Patient has no known allergies.   Review of Systems Review of Systems See HPI  Physical Exam Triage Vital Signs ED Triage Vitals  Enc Vitals Group     BP 12/17/19 1035 113/69     Pulse Rate 12/17/19 1035 100     Resp 12/17/19 1035 16     Temp 12/17/19 1035 98.7 F (37.1 C)     Temp Source 12/17/19 1035 Oral     SpO2 12/17/19 1035 95 %     Weight --      Height --      Head Circumference --      Peak Flow --      Pain Score 12/17/19 1033 5     Pain Loc --      Pain Edu? --      Excl. in GC? --    No data found.  Updated Vital Signs BP 113/69 (BP Location: Left Arm)   Pulse 100   Temp 98.7 F (37.1 C) (Oral)   Resp 16   LMP 12/06/2019   SpO2 95%       Physical Exam Constitutional:      General: She is not in acute distress.    Appearance: She is well-developed.     Comments: Observation only.  Mask in place  HENT:     Head: Normocephalic and atraumatic.  Eyes:     Conjunctiva/sclera: Conjunctivae normal.     Pupils: Pupils are equal, round, and reactive to light.  Cardiovascular:     Rate and  Rhythm: Normal rate.  Pulmonary:     Effort: Pulmonary effort is normal. No respiratory distress.  Abdominal:     Palpations: Abdomen is soft.  Musculoskeletal:        General: Normal range of motion.     Cervical back: Normal range of motion.  Skin:    General: Skin is warm and dry.  Neurological:     Mental Status: She is alert.  Psychiatric:        Behavior: Behavior normal.      UC Treatments / Results  Labs (all labs ordered are listed, but only abnormal results are displayed) Labs Reviewed  HIV ANTIBODY (ROUTINE TESTING W REFLEX)  RPR    EKG   Radiology No results found.  Procedures Procedures (including critical care time)  Medications Ordered in UC Medications  azithromycin (ZITHROMAX) tablet 1,000 mg (has no administration in time range)    Initial Impression / Assessment and Plan / UC Course  I have reviewed the triage vital signs and the nursing notes.  Pertinent labs & imaging results that were available during my care of the patient were reviewed by me and considered in my medical decision making (see chart for details).     Treatment is offered.  Safe sex is discussed Final Clinical Impressions(s) / UC Diagnoses   Final diagnoses:  Sexually transmitted disease (STD)     Discharge Instructions     I have sent medicine to the pharmacy for you Metronidazole, 1 pill 3 times a day for 7 days we will treat BV Diflucan, 1 pill now and then 1 pill in 7 days to treat yeast infection The pills in the office today will get rid of chlamydia Check my chart for your blood test results   ED Prescriptions    Medication Sig Dispense Auth. Provider   metroNIDAZOLE (  FLAGYL) 500 MG tablet Take 1 tablet (500 mg total) by mouth 2 (two) times daily. 14 tablet Eustace Moore, MD   fluconazole (DIFLUCAN) 150 MG tablet Take 1 tablet (150 mg total) by mouth daily. Repeat in 1 week if needed 2 tablet Eustace Moore, MD     PDMP not reviewed this  encounter.   Eustace Moore, MD 12/17/19 1100

## 2019-12-18 LAB — SYPHILIS: RPR W/REFLEX TO RPR TITER AND TREPONEMAL ANTIBODIES, TRADITIONAL SCREENING AND DIAGNOSIS ALGORITHM: RPR Ser Ql: NONREACTIVE

## 2020-04-12 ENCOUNTER — Other Ambulatory Visit: Payer: Self-pay | Admitting: Obstetrics and Gynecology

## 2020-04-12 DIAGNOSIS — Z319 Encounter for procreative management, unspecified: Secondary | ICD-10-CM

## 2020-04-17 ENCOUNTER — Ambulatory Visit
Admission: RE | Admit: 2020-04-17 | Discharge: 2020-04-17 | Disposition: A | Discharge status: Home / Self Care | Payer: PRIVATE HEALTH INSURANCE | Source: Ambulatory Visit | Attending: Obstetrics and Gynecology | Admitting: Obstetrics and Gynecology

## 2020-04-17 DIAGNOSIS — Z319 Encounter for procreative management, unspecified: Secondary | ICD-10-CM

## 2020-05-17 ENCOUNTER — Ambulatory Visit: Payer: PRIVATE HEALTH INSURANCE | Admitting: Endocrinology

## 2020-06-21 ENCOUNTER — Other Ambulatory Visit: Payer: Self-pay

## 2020-06-21 ENCOUNTER — Emergency Department (HOSPITAL_BASED_OUTPATIENT_CLINIC_OR_DEPARTMENT_OTHER)
Admission: EM | Admit: 2020-06-21 | Discharge: 2020-06-21 | Disposition: A | Discharge status: Home / Self Care | Payer: PRIVATE HEALTH INSURANCE | Attending: Emergency Medicine | Admitting: Emergency Medicine

## 2020-06-21 ENCOUNTER — Emergency Department (HOSPITAL_BASED_OUTPATIENT_CLINIC_OR_DEPARTMENT_OTHER): Payer: PRIVATE HEALTH INSURANCE

## 2020-06-21 ENCOUNTER — Encounter (HOSPITAL_BASED_OUTPATIENT_CLINIC_OR_DEPARTMENT_OTHER): Payer: Self-pay

## 2020-06-21 DIAGNOSIS — M2392 Unspecified internal derangement of left knee: Secondary | ICD-10-CM

## 2020-06-21 DIAGNOSIS — Y9389 Activity, other specified: Secondary | ICD-10-CM | POA: Insufficient documentation

## 2020-06-21 DIAGNOSIS — X501XXA Overexertion from prolonged static or awkward postures, initial encounter: Secondary | ICD-10-CM | POA: Insufficient documentation

## 2020-06-21 DIAGNOSIS — M25562 Pain in left knee: Secondary | ICD-10-CM | POA: Diagnosis present

## 2020-06-21 MED ORDER — IBUPROFEN 400 MG PO TABS
400.0000 mg | ORAL_TABLET | Freq: Once | ORAL | Status: AC
  Administered 2020-06-21: 400 mg via ORAL
  Filled 2020-06-21: qty 1

## 2020-06-21 NOTE — ED Triage Notes (Signed)
Pt c/o pain, swelling to left knee-states "knee popped out then back in"-NAD-steady gait

## 2020-06-21 NOTE — ED Provider Notes (Signed)
[facility] EMERGENCY DEPARTMENT Provider Note   CSN: [id number] Arrival date & time: 06/21/20  1941     History Chief Complaint  Patient presents with  . Knee Pain    Madeline Wilkins is a 25 y.o. female.  The history is provided by the patient.  Knee Pain  Madeline Chinn is a 25 y.o. female who presents to the Emergency Department complaining of knee injury. Four nights ago she was lying in bed when she stretched her leg and twisted when she was rolling over and felt like it popped out of place and then back in place. She complains of ongoing pain to the left knee. Pain is worse when she stands for long period of time. No prior similar symptoms. She has no medical problems. She takes no medications. No history of spontaneous dislocations in the past. 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    Past Medical History:  Diagnosis Date  . Gonorrhea   . 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 Infection    UTI  . Kidney stone   . Trichomonal infection   . Urinary tract infection 02/23/2015    Patient Active Problem List   Diagnosis Date Noted  . Pregnancy of unknown anatomic location 01/15/2016    Past Surgical History:  Procedure Laterality Date  . INDUCED ABORTION    . REMOVAL OF IMPLANON ROD  07/26/2019     OB History    Gravida  2   Para  1   Term  1   Preterm      AB  1   Living  1     SAB      IAB  1   Ectopic      Multiple  0   Live Births  1        Obstetric Comments  G2: mid December 2017 in office D&C (elective AB)        Family History  Problem Relation Age of Onset  . Healthy Mother   . Healthy Father   . Cancer Neg Hx   . Diabetes Neg Hx   . Heart disease Neg Hx   . Hypertension Neg Hx   . Stroke Neg Hx   . Hearing loss Neg Hx   .  Asthma Neg Hx     Social History   Tobacco Use  . Smoking status: Never Smoker  . Smokeless tobacco: Never Used  Vaping Use  . Vaping Use: Never used  Substance Use Topics  . Alcohol use: No  . Drug use: No    Home Medications Prior to Admission medications   Medication Sig Start Date End Date Taking? Authorizing Provider  benzocaine (ORAJEL) 10 % mucosal gel Use as directed 1 application in the mouth or throat as needed for mouth pain. 09/19/19   Darr, 07/28/2019, PA-C  etonogestrel (NEXPLANON) 68 MG IMPL implant 1 each by Subdermal route once. Left arm (10/2015)    [provider]  fluconazole (DIFLUCAN) 150 MG tablet Take 1 tablet (150 mg total) by mouth daily. Repeat in 1 week if needed 12/17/19   Gerilyn Pilgrim, MD  metroNIDAZOLE (FLAGYL) 500 MG tablet Take 1 tablet (500 mg total) by mouth 2 (two) times daily. 12/17/19  Eustace Moore, MD    Allergies    Shellfish allergy  Review of Systems   Review of Systems  All other systems reviewed and are negative.   Physical Exam Updated Vital Signs BP (!) 132/93 (BP Location: Left Arm)   Pulse 71   Temp 98.2 F (36.8 C) (Oral)   Resp 16   Ht 5\' 8"  (1.727 m)   Wt 77.1 kg   LMP 06/11/2020   SpO2 100%   BMI 25.85 kg/m   Physical Exam Vitals and nursing note reviewed.  Constitutional:      Appearance: She is well-developed.  HENT:     Head: Normocephalic and atraumatic.  Cardiovascular:     Rate and Rhythm: Normal rate and regular rhythm.  Pulmonary:     Effort: Pulmonary effort is normal. No respiratory distress.  Abdominal:     Palpations: Abdomen is soft.     Tenderness: There is no abdominal tenderness. There is no guarding or rebound.  Musculoskeletal:     Comments: 2+ DP pulses bilaterally.  Mild swelling to the left knee with flexion/extension intact.  Mild tenderness over medial and lateral joint line.    Skin:    General: Skin is warm and dry.  Neurological:     Mental Status: She is alert  and oriented to person, place, and time.  Psychiatric:        Behavior: Behavior normal.     ED Results / Procedures / Treatments   Labs (all labs ordered are listed, but only abnormal results are displayed) Labs Reviewed - No data to display  EKG None  Radiology DG Knee Complete 4 Views Left  Result Date: 06/21/2020 CLINICAL DATA:  Left knee pain since a dislocation 4 days ago. Initial encounter. EXAM: LEFT KNEE - COMPLETE 4+ VIEW COMPARISON:  None. FINDINGS: No evidence of fracture, dislocation, or joint effusion. Moderate joint effusion is noted. Soft tissues are unremarkable. IMPRESSION: Moderate joint effusion.  Otherwise negative. Electronically Signed   By: 06/23/2020 M.D.   On: 06/21/2020 20:08    Procedures Procedures   Medications Ordered in ED Medications  ibuprofen (ADVIL) tablet 400 mg (has no administration in time range)    ED Course  I have reviewed the triage vital signs and the nursing notes.  Pertinent labs & imaging results that were available during my care of the patient were reviewed by me and considered in my medical decision making (see chart for details).    MDM Rules/Calculators/A&P                         Patient here for evaluation of left knee injury that occurred when she was twisting in bed. History and mechanism is not consistent with knee dislocation. She does have soft tissue swelling on examination with good distal pulses. Concern for possible ligament tear. Discussed home care with rest, knee sleeper comfort, Tylenol and ibuprofen according to label instructions with orthopedic follow-up. Presentation is not consistent with septic arthritis.  Final Clinical Impression(s) / ED Diagnoses Final diagnoses:  Internal derangement of left knee    Rx / DC Orders ED Discharge Orders    None       06/23/2020, MD 06/21/20 2017

## 2020-07-05 ENCOUNTER — Ambulatory Visit: Payer: PRIVATE HEALTH INSURANCE | Admitting: Endocrinology

## 2020-07-20 ENCOUNTER — Encounter: Payer: Self-pay | Admitting: Endocrinology

## 2020-07-20 ENCOUNTER — Other Ambulatory Visit: Payer: Self-pay

## 2020-07-20 ENCOUNTER — Ambulatory Visit (INDEPENDENT_AMBULATORY_CARE_PROVIDER_SITE_OTHER): Payer: PRIVATE HEALTH INSURANCE | Admitting: Endocrinology

## 2020-07-20 DIAGNOSIS — E221 Hyperprolactinemia: Secondary | ICD-10-CM

## 2020-07-20 MED ORDER — BROMOCRIPTINE MESYLATE 2.5 MG PO TABS: 2.5000 mg | ORAL_TABLET | Freq: Every day | ORAL | 3 refills | Status: DC

## 2020-07-20 NOTE — Patient Instructions (Addendum)
Please start taking "bromocriptine," to help the prolactin. It has possible side effects of nausea and dizziness.  These go away with time.  You can avoid these by taking it at bedtime, and by taking just take 1/2 pill for the first week.   Please check blood tests in approx 2 weeks. You do not need to be fasting.   Please come back for a follow-up appointment in 2 months.  Please call sooner if the pregnancy happens.

## 2020-07-20 NOTE — Progress Notes (Signed)
Subjective:    Patient ID: Madeline Wilkins, female    DOB: 05/15/1995, 25 y.o.   MRN: [id number]  HPI Pt is referred by Dr Timothy Lasso, for hyperprolactinemia.  Pt had menarche at age 40.  She has always had regular menses. She is G2P1.  She wants to start a pregnancy now.  She was noted to have an elevated prolactin in 2021.  She denies the following: excessive exercise, opiates, antipsychotics, brain XRT, brain surgery, cirrhosis,  thyroid dz, seizures, infertility, PCO, renal dz, zoster, brain injury, or chest wall injury.  She has never had pituitary imaging.  She had nexplanon removed 8/21, after it was on x 3 years.  She has been attempting pregnancy since 11/21.  She has bilat galactorrhea.  She has reg menses.   Past Medical History:  Diagnosis Date  . Gonorrhea   . 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 Infection    UTI  . Kidney stone   . Trichomonal infection   . Urinary tract infection 02/23/2015    Past Surgical History:  Procedure Laterality Date  . INDUCED ABORTION    . REMOVAL OF IMPLANON ROD  07/26/2019    Social History   Socioeconomic History  . Marital status: Single    Spouse name: Not on file  . Number of children: Not on file  . Years of education: Not on file  . Highest education level: Not on file  Occupational History  . Not on file  Tobacco Use  . Smoking status: Never Smoker  . Smokeless tobacco: Never Used  Vaping Use  . Vaping Use: Never used  Substance and Sexual Activity  . Alcohol use: No  . Drug use: No  . Sexual activity: Not on file  Other Topics Concern  . Not on file  Social History Narrative  . Not on file   Social Determinants of Health   Financial Resource Strain: Not on file  Food Insecurity: Not on file  Transportation Needs: Not on file  Physical Activity: Not on  file  Stress: Not on file  Social Connections: Not on file  Intimate Partner Violence: Not on file    Current Outpatient Medications on File Prior to Visit  Medication Sig Dispense Refill  . benzocaine (ORAJEL) 10 % mucosal gel Use as directed 1 application in the mouth or throat as needed for mouth pain. 5.3 g 0  . etonogestrel (NEXPLANON) 68 MG IMPL implant 1 each by Subdermal route once. Left arm (10/2015)    . fluconazole (DIFLUCAN) 150 MG tablet Take 1 tablet (150 mg total) by mouth daily. Repeat in 1 week if needed 2 tablet 0  . metroNIDAZOLE (FLAGYL) 500 MG tablet Take 1 tablet (500 mg total) by mouth 2 (two) times daily. 14 tablet 0   No current facility-administered medications on file prior to visit.    Allergies  Allergen Reactions  . Shellfish Allergy     Family History  Problem Relation Age of Onset  . Healthy Mother   . Healthy Father   . Cancer Neg Hx   . Diabetes Neg Hx   . Heart disease Neg Hx   . Hypertension Neg Hx   . Stroke Neg Hx   . Hearing loss Neg Hx   . Asthma Neg Hx   . Other  Neg Hx        elev prolactin    BP 100/80 (BP Location: Right Arm, Patient Position: Sitting, Cuff Size: Normal)   Pulse 89   Ht 5\' 8"  (1.727 m)   Wt 157 lb 9.6 oz (71.5 kg)   SpO2 96%   BMI 23.96 kg/m     Review of Systems denies polyuria, loss of smell, headache, visual loss, weight change, and n/v.      Objective:   Physical Exam VS: see vs page GEN: no distress HEAD: head: no deformity eyes: no periorbital swelling, no proptosis external nose and ears are normal NECK: supple, thyroid is not enlarged CHEST WALL: no deformity LUNGS: clear to auscultation CV: reg rate and rhythm, no murmur.  MUSCULOSKELETAL: gait is normal and steady EXTEMITIES: no deformity.  no leg edema NEURO:  readily moves all 4's.  sensation is intact to touch on all 4's.   SKIN:  Normal texture and temperature.  No rash or suspicious lesion is visible.   NODES:  None palpable at  the neck PSYCH: alert, well-oriented.  Does not appear anxious nor depressed.    outside test results are reviewed: Prolactin=49 and 24  I have reviewed outside records, and summarized: Pt was noted to have elevated prolactin, and referred here.  General health was noted to be good      Assessment & Plan:  Hyperprolactinemia, new to me, uncontrolled.  Patient Instructions  Please start taking "bromocriptine," to help the prolactin. It has possible side effects of nausea and dizziness.  These go away with time.  You can avoid these by taking it at bedtime, and by taking just take 1/2 pill for the first week.   Please check blood tests in approx 2 weeks. You do not need to be fasting.   Please come back for a follow-up appointment in 2 months.  Please call sooner if the pregnancy happens.

## 2020-07-27 ENCOUNTER — Encounter: Payer: Self-pay | Admitting: *Deleted

## 2020-08-21 ENCOUNTER — Other Ambulatory Visit: Payer: Self-pay

## 2020-08-21 ENCOUNTER — Other Ambulatory Visit: Payer: PRIVATE HEALTH INSURANCE

## 2020-09-12 ENCOUNTER — Ambulatory Visit
Admission: EM | Admit: 2020-09-12 | Discharge: 2020-09-12 | Disposition: A | Discharge status: Home / Self Care | Payer: PRIVATE HEALTH INSURANCE | Attending: Emergency Medicine | Admitting: Emergency Medicine

## 2020-09-12 ENCOUNTER — Other Ambulatory Visit: Payer: Self-pay

## 2020-09-12 ENCOUNTER — Ambulatory Visit (HOSPITAL_COMMUNITY)
Admission: EM | Admit: 2020-09-12 | Discharge: 2020-09-12 | Discharge status: Against Medical Advice | Payer: PRIVATE HEALTH INSURANCE

## 2020-09-12 DIAGNOSIS — Z113 Encounter for screening for infections with a predominantly sexual mode of transmission: Secondary | ICD-10-CM | POA: Insufficient documentation

## 2020-09-12 NOTE — ED Triage Notes (Signed)
Pt presents for STD testing d/t one day of thin discharge with some whiteness to it.  Mild odor.  States she just feels like something is different in vaginal area.  No rash, no urinary symptoms.

## 2020-09-12 NOTE — ED Provider Notes (Signed)
[facility]    CSN: [id number] Arrival date & time: 09/12/20  1431      History   Chief Complaint Chief Complaint  Patient presents with   SEXUALLY TRANSMITTED DISEASE    HPI Madeline Wilkins is a 25 y.o. female presenting today for STD screening.  Reports that she has had 1 day of whitish vaginal discharge.  Denies associated itching or irritation.  Denies abdominal pain.  Denies abnormal bleeding.  Last menstrual cycle ended approximately 1 week ago.  Denies any urinary symptoms.  Does report new partner recently.  HPI  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   Patient will need to be screened every 3 months 02/2015 nonreactive; exposed in Summer 2016, former partner incarcerated March 2017 [x]  NR July 2017 [x]  NR   Sept 2017 [x]  NR   Infection    UTI   Kidney stone    Trichomonal infection    Urinary tract infection 02/23/2015    Patient Active Problem List   Diagnosis Date Noted   Hyperprolactinemia (HCC) 07/20/2020   Pregnancy of unknown anatomic location 01/15/2016    Past Surgical History:  Procedure Laterality Date   INDUCED ABORTION     REMOVAL OF IMPLANON ROD  07/26/2019    OB History     Gravida  2   Para  1   Term  1   Preterm      AB  1   Living  1      SAB      IAB  1   Ectopic      Multiple  0   Live Births  1        Obstetric Comments  G2: mid December 2017 in office D&C (elective AB)          Home Medications    Prior to Admission medications   Medication Sig Start Date End Date Taking? Authorizing Provider  bromocriptine (PARLODEL) 2.5 MG tablet Take 1 tablet (2.5 mg total) by mouth at bedtime. 07/20/20  Yes 07/28/2019, MD    Family History Family History  Problem Relation Age of Onset   Healthy Mother    Healthy Father    Cancer Neg Hx    Diabetes Neg Hx    Heart disease Neg Hx    Hypertension Neg Hx    Stroke Neg Hx    Hearing loss Neg Hx    Asthma Neg  Hx    Other Neg Hx        elev prolactin    Social History Social History   Tobacco Use   Smoking status: Never   Smokeless tobacco: Never  Vaping Use   Vaping Use: Never used  Substance Use Topics   Alcohol use: No   Drug use: No     Allergies   Shellfish allergy   Review of Systems Review of Systems  Constitutional:  Negative for fever.  Respiratory:  Negative for shortness of breath.   Cardiovascular:  Negative for chest pain.  Gastrointestinal:  Negative for abdominal pain, diarrhea, nausea and vomiting.  Genitourinary:  Positive for vaginal discharge. Negative for dysuria, flank pain, genital sores, hematuria, menstrual problem, vaginal bleeding and vaginal pain.  Musculoskeletal:  Negative for back pain.  Skin:  Negative for rash.  Neurological:  Negative for dizziness, light-headedness and headaches.    Physical Exam Triage Vital Signs ED Triage Vitals  Enc Vitals Group  BP      Pulse      Resp      Temp      Temp src      SpO2      Weight      Height      Head Circumference      Peak Flow      Pain Score      Pain Loc      Pain Edu?      Excl. in GC?    No data found.  Updated Vital Signs BP 113/69 (BP Location: Left Arm)   Pulse 71   Temp 98.5 F (36.9 C) (Oral)   Resp 18   LMP 09/04/2020   SpO2 97%   Visual Acuity Right Eye Distance:   Left Eye Distance:   Bilateral Distance:    Right Eye Near:   Left Eye Near:    Bilateral Near:     Physical Exam Vitals and nursing note reviewed.  Constitutional:      Appearance: She is well-developed.     Comments: No acute distress  HENT:     Head: Normocephalic and atraumatic.     Nose: Nose normal.  Eyes:     Conjunctiva/sclera: Conjunctivae normal.  Cardiovascular:     Rate and Rhythm: Normal rate.  Pulmonary:     Effort: Pulmonary effort is normal. No respiratory distress.  Abdominal:     General: There is no distension.  Musculoskeletal:        General: Normal range of  motion.     Cervical back: Neck supple.  Skin:    General: Skin is warm and dry.  Neurological:     Mental Status: She is alert and oriented to person, place, and time.     UC Treatments / Results  Labs (all labs ordered are listed, but only abnormal results are displayed) Labs Reviewed  CERVICOVAGINAL ANCILLARY ONLY    EKG   Radiology No results found.  Procedures Procedures (including critical care time)  Medications Ordered in UC Medications - No data to display  Initial Impression / Assessment and Plan / UC Course  I have reviewed the triage vital signs and the nursing notes.  Pertinent labs & imaging results that were available during my care of the patient were reviewed by me and considered in my medical decision making (see chart for details).    Vaginal swab pending for screening for any infections contributing to discharge.  Discussed with patient may be natural normal discharge depending on timing of cycle.  We will call with results and provide treatment as needed.  Discussed strict return precautions. Patient verbalized understanding and is agreeable with plan.  Final Clinical Impressions(s) / UC Diagnoses   Final diagnoses:  Screen for STD (sexually transmitted disease)     Discharge Instructions      We are testing you for Gonorrhea, Chlamydia, Trichomonas, Yeast and Bacterial Vaginosis. We will call you if anything is positive and let you know if you require any further treatment. Please inform partners of any positive results.   Please return if symptoms not improving with treatment, development of fever, nausea, vomiting, abdominal pain.      ED Prescriptions   None    PDMP not reviewed this encounter.   Lew Dawes, PA-C 09/12/20 1524

## 2020-09-12 NOTE — Discharge Instructions (Addendum)
We are testing you for Gonorrhea, Chlamydia, Trichomonas, Yeast and Bacterial Vaginosis. We will call you if anything is positive and let you know if you require any further treatment. Please inform partners of any positive results.   Please return if symptoms not improving with treatment, development of fever, nausea, vomiting, abdominal pain.  

## 2020-09-13 LAB — CERVICOVAGINAL ANCILLARY ONLY
Bacterial Vaginitis (gardnerella): POSITIVE — AB
Candida Glabrata: NEGATIVE
Candida Vaginitis: NEGATIVE
Chlamydia: NEGATIVE
Comment: NEGATIVE
Comment: NEGATIVE
Comment: NEGATIVE
Comment: NEGATIVE
Comment: NEGATIVE
Comment: NORMAL
Neisseria Gonorrhea: NEGATIVE
Trichomonas: NEGATIVE

## 2020-09-19 ENCOUNTER — Telehealth: Payer: Self-pay | Admitting: Emergency Medicine

## 2020-09-19 MED ORDER — METRONIDAZOLE 500 MG PO TABS: 500.0000 mg | ORAL_TABLET | Freq: Two times a day (BID) | ORAL | 0 refills | Status: DC

## 2020-09-19 NOTE — Telephone Encounter (Signed)
Positive for BV, metronidazole sent to pharmacy.

## 2020-09-21 ENCOUNTER — Telehealth: Payer: Self-pay | Admitting: Emergency Medicine

## 2020-09-21 MED ORDER — METRONIDAZOLE 500 MG PO TABS: 500.0000 mg | ORAL_TABLET | Freq: Two times a day (BID) | ORAL | 0 refills | Status: AC

## 2020-09-21 MED ORDER — METRONIDAZOLE 500 MG PO TABS: 500.0000 mg | ORAL_TABLET | Freq: Two times a day (BID) | ORAL | 0 refills | Status: DC

## 2020-09-22 ENCOUNTER — Ambulatory Visit: Payer: PRIVATE HEALTH INSURANCE | Admitting: Endocrinology

## 2020-10-07 ENCOUNTER — Ambulatory Visit (HOSPITAL_COMMUNITY): Payer: Self-pay

## 2021-01-08 ENCOUNTER — Other Ambulatory Visit: Payer: Self-pay

## 2021-01-08 ENCOUNTER — Ambulatory Visit (HOSPITAL_COMMUNITY)
Admission: EM | Admit: 2021-01-08 | Discharge: 2021-01-08 | Disposition: A | Discharge status: Home / Self Care | Payer: Self-pay | Attending: Internal Medicine | Admitting: Internal Medicine

## 2021-01-08 ENCOUNTER — Encounter (HOSPITAL_COMMUNITY): Payer: Self-pay | Admitting: Emergency Medicine

## 2021-01-08 DIAGNOSIS — Z3202 Encounter for pregnancy test, result negative: Secondary | ICD-10-CM

## 2021-01-08 DIAGNOSIS — Z113 Encounter for screening for infections with a predominantly sexual mode of transmission: Secondary | ICD-10-CM | POA: Insufficient documentation

## 2021-01-08 LAB — POC URINE PREG, ED: Preg Test, Ur: NEGATIVE

## 2021-01-08 LAB — HIV ANTIBODY (ROUTINE TESTING W REFLEX): HIV Screen 4th Generation wRfx: NONREACTIVE

## 2021-01-08 LAB — HCG, SERUM, QUALITATIVE: Preg, Serum: NEGATIVE

## 2021-01-08 NOTE — Discharge Instructions (Signed)
Your urine pregnancy test is negative.  Check your Mychart for results. We will call you if any of them are positive.  Have your future sexual partners use condoms.

## 2021-01-08 NOTE — ED Triage Notes (Signed)
Patient denies symptoms, but wants to be checked for std

## 2021-01-08 NOTE — ED Provider Notes (Signed)
[facility]    CSN: [id number] Arrival date & time: 01/08/21  8469      History   Chief Complaint Chief Complaint  Patient presents with   SEXUALLY TRANSMITTED DISEASE    HPI Madeline Wilkins is a 25 y.o. female who presents requesting STD check. Denies any symptoms. She broke up with her partner last week. Wants serum pregnancy test. Had sex with him a few days after 10/28.   LMP 10/28 with normal flow as usual.    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   Patient will need to be screened every 3 months 02/2015 nonreactive; exposed in Summer 2016, former partner incarcerated March 2017 [x]  NR July 2017 [x]  NR   Sept 2017 [x]  NR   Infection    UTI   Kidney stone    Trichomonal infection    Urinary tract infection 02/23/2015    Patient Active Problem List   Diagnosis Date Noted   Hyperprolactinemia (HCC) 07/20/2020   Pregnancy of unknown anatomic location 01/15/2016    Past Surgical History:  Procedure Laterality Date   INDUCED ABORTION     REMOVAL OF IMPLANON ROD  07/26/2019    OB History     Gravida  2   Para  1   Term  1   Preterm      AB  1   Living  1      SAB      IAB  1   Ectopic      Multiple  0   Live Births  1        Obstetric Comments  G2: mid December 2017 in office D&C (elective AB)          Home Medications    Prior to Admission medications   Medication Sig Start Date End Date Taking? Authorizing Provider  bromocriptine (PARLODEL) 2.5 MG tablet Take 1 tablet (2.5 mg total) by mouth at bedtime. Patient not taking: Reported on 01/08/2021 07/20/20   January 2018, MD    Family History Family History  Problem Relation Age of Onset   Healthy Mother    Healthy Father    Cancer Neg Hx    Diabetes Neg Hx    Heart disease Neg Hx    Hypertension Neg Hx    Stroke Neg Hx    Hearing loss Neg Hx    Asthma Neg Hx    Other Neg Hx        elev prolactin    Social  History Social History   Tobacco Use   Smoking status: Never   Smokeless tobacco: Never  Vaping Use   Vaping Use: Never used  Substance Use Topics   Alcohol use: Yes   Drug use: No     Allergies   Shellfish allergy   Review of Systems Review of Systems  Genitourinary:  Negative for dysuria, frequency, pelvic pain, urgency and vaginal discharge.    Physical Exam Triage Vital Signs ED Triage Vitals  Enc Vitals Group     BP 01/08/21 1208 121/80     Pulse Rate 01/08/21 1208 81     Resp 01/08/21 1208 18     Temp 01/08/21 1208 97.9 F (36.6 C)     Temp Source 01/08/21 1208 Oral     SpO2 01/08/21 1208 97 %     Weight --      Height --      Head Circumference --  Peak Flow --      Pain Score 01/08/21 1206 0     Pain Loc --      Pain Edu? --      Excl. in GC? --    No data found.  Updated Vital Signs BP 121/80 (BP Location: Right Arm)   Pulse 81   Temp 97.9 F (36.6 C) (Oral)   Resp 18   LMP 12/29/2020   SpO2 97%   Visual Acuity Right Eye Distance:   Left Eye Distance:   Bilateral Distance:    Right Eye Near:   Left Eye Near:    Bilateral Near:     Physical Exam Vitals and nursing note reviewed.  Constitutional:      General: She is not in acute distress.    Appearance: She is normal weight. She is not toxic-appearing.  HENT:     Right Ear: External ear normal.     Left Ear: External ear normal.  Eyes:     General: No scleral icterus.    Conjunctiva/sclera: Conjunctivae normal.  Pulmonary:     Effort: Pulmonary effort is normal.  Musculoskeletal:        General: Normal range of motion.     Cervical back: Neck supple.  Neurological:     Mental Status: She is alert and oriented to person, place, and time.     Gait: Gait normal.  Psychiatric:        Mood and Affect: Mood normal.        Behavior: Behavior normal.        Thought Content: Thought content normal.        Judgment: Judgment normal.     UC Treatments / Results  Labs (all  labs ordered are listed, but only abnormal results are displayed) Labs Reviewed  RPR  HIV ANTIBODY (ROUTINE TESTING W REFLEX)  HCG, SERUM, QUALITATIVE  POC URINE PREG, ED  CERVICOVAGINAL ANCILLARY ONLY    EKG   Radiology No results found.  Procedures Procedures (including critical care time)  Medications Ordered in UC Medications - No data to display  Initial Impression / Assessment and Plan / UC Course  I have reviewed the triage vital signs and the nursing notes. Pertinent labs  results that were available during my care of the patient were reviewed by me and considered in my medical decision making (see chart for details). Screen STD See instructions.   Final Clinical Impressions(s) / UC Diagnoses   Final diagnoses:  Screen for STD (sexually transmitted disease)     Discharge Instructions      Your urine pregnancy test is negative.  Check your Mychart for results. We will call you if any of them are positive.  Have your future sexual partners use condoms.      ED Prescriptions   None    PDMP not reviewed this encounter.   Garey Ham, [place] 01/08/21 1307

## 2021-01-09 LAB — CERVICOVAGINAL ANCILLARY ONLY
Bacterial Vaginitis (gardnerella): POSITIVE — AB
Candida Glabrata: NEGATIVE
Candida Vaginitis: NEGATIVE
Chlamydia: POSITIVE — AB
Comment: NEGATIVE
Comment: NEGATIVE
Comment: NEGATIVE
Comment: NEGATIVE
Comment: NEGATIVE
Comment: NORMAL
Neisseria Gonorrhea: NEGATIVE
Trichomonas: NEGATIVE

## 2021-01-09 LAB — RPR: RPR Ser Ql: NONREACTIVE

## 2021-01-10 ENCOUNTER — Telehealth (HOSPITAL_COMMUNITY): Payer: Self-pay | Admitting: Emergency Medicine

## 2021-01-10 MED ORDER — METRONIDAZOLE 0.75 % VA GEL: 1.0000 | Freq: Every day | VAGINAL | 0 refills | Status: AC

## 2021-01-10 MED ORDER — DOXYCYCLINE HYCLATE 100 MG PO CAPS: 100.0000 mg | ORAL_CAPSULE | Freq: Two times a day (BID) | ORAL | 0 refills | Status: AC

## 2021-01-15 ENCOUNTER — Encounter: Payer: Self-pay | Admitting: *Deleted

## 2021-02-19 ENCOUNTER — Encounter (HOSPITAL_COMMUNITY): Payer: Self-pay | Admitting: Emergency Medicine

## 2021-02-19 ENCOUNTER — Ambulatory Visit (HOSPITAL_COMMUNITY)
Admission: EM | Admit: 2021-02-19 | Discharge: 2021-02-19 | Disposition: A | Discharge status: Home / Self Care | Payer: Self-pay | Attending: Urgent Care | Admitting: Urgent Care

## 2021-02-19 ENCOUNTER — Other Ambulatory Visit: Payer: Self-pay

## 2021-02-19 DIAGNOSIS — Z8619 Personal history of other infectious and parasitic diseases: Secondary | ICD-10-CM | POA: Insufficient documentation

## 2021-02-19 DIAGNOSIS — N898 Other specified noninflammatory disorders of vagina: Secondary | ICD-10-CM | POA: Insufficient documentation

## 2021-02-19 LAB — POC URINE PREG, ED: Preg Test, Ur: NEGATIVE

## 2021-02-19 NOTE — ED Triage Notes (Signed)
Pt presents with STD testing. States was unable to complete doxycycline after last testing due to the medication making her sick. States still having minor discharge. States would like pregnancy test.

## 2021-02-19 NOTE — Discharge Instructions (Signed)
Your pregnancy test is negative. We will call with the results of your vaginal swab. If still positive for chlamydia, we will try Azithromycin 1g. Please continue to abstain from intercourse until your results are obtained. Always use a condom or other form of protection to prevent an sexually transmitted disease.

## 2021-02-19 NOTE — ED Provider Notes (Signed)
[facility]    CSN: [id number] Arrival date & time: 02/19/21  1802      History   Chief Complaint Chief Complaint  Patient presents with   Vaginal Discharge    HPI Madeline Wilkins is a 25 y.o. female.   Pleasant 25 year old female presents today due to concerns of vaginal discharge.  She was last seen for this on 01/08/2021, and diagnosed with chlamydia.  She was started on doxycycline twice daily for 7 days on 01/10/21.  Patient states she took 5 of the 7 days, but on day 6 started vomiting up with the medication every time she took it.  She states overall her discharge has improved significantly but is still having some and wants to ensure that the chlamydia is resolved.  She states she has not had sexual intercourse since early November, but is hoping to resume activity.  She was also treated for bacterial vaginosis at the same time and states she took the vaginal suppository as directed without side effects.  Patient otherwise denies additional symptoms, no rash, pelvic pain, hematuria, dysuria, fever, abdominal pain, or any other symptoms.   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   Patient will need to be screened every 3 months 02/2015 nonreactive; exposed in Summer 2016, former partner incarcerated March 2017 [x]  NR July 2017 [x]  NR   Sept 2017 [x]  NR   Infection    UTI   Kidney stone    Trichomonal infection    Urinary tract infection 02/23/2015    Patient Active Problem List   Diagnosis Date Noted   Hyperprolactinemia (HCC) 07/20/2020   Pregnancy of unknown anatomic location 01/15/2016    Past Surgical History:  Procedure Laterality Date   INDUCED ABORTION     REMOVAL OF IMPLANON ROD  07/26/2019    OB History     Gravida  2   Para  1   Term  1   Preterm      AB  1   Living  1      SAB      IAB  1   Ectopic      Multiple  0   Live Births  1        Obstetric Comments   G2: mid December 2017 in office D&C (elective AB)          Home Medications    Prior to Admission medications   Medication Sig Start Date End Date Taking? Authorizing Provider  bromocriptine (PARLODEL) 2.5 MG tablet Take 1 tablet (2.5 mg total) by mouth at bedtime. Patient not taking: Reported on 01/08/2021 07/20/20   January 2018, MD    Family History Family History  Problem Relation Age of Onset   Healthy Mother    Healthy Father    Cancer Neg Hx    Diabetes Neg Hx    Heart disease Neg Hx    Hypertension Neg Hx    Stroke Neg Hx    Hearing loss Neg Hx    Asthma Neg Hx    Other Neg Hx        elev prolactin    Social History Social History   Tobacco Use   Smoking status: Never   Smokeless tobacco: Never  Vaping Use   Vaping Use: Never used  Substance Use Topics   Alcohol use: Yes   Drug use: No     Allergies   Shellfish allergy  Review of Systems Review of Systems  Genitourinary:  Positive for vaginal discharge.  As per hpi.  Physical Exam Triage Vital Signs ED Triage Vitals  Enc Vitals Group     BP 02/19/21 1905 135/79     Pulse Rate 02/19/21 1905 81     Resp 02/19/21 1905 16     Temp 02/19/21 1905 98.2 F (36.8 C)     Temp Source 02/19/21 1905 Oral     SpO2 02/19/21 1905 100 %     Weight --      Height --      Head Circumference --      Peak Flow --      Pain Score 02/19/21 1900 0     Pain Loc --      Pain Edu? --      Excl. in [place]? --    No data found.  Updated Vital Signs BP 135/79 (BP Location: Left Arm)    Pulse 81    Temp 98.2 F (36.8 C) (Oral)    Resp 16    LMP 01/21/2021    SpO2 100%   Visual Acuity Right Eye Distance:   Left Eye Distance:   Bilateral Distance:    Right Eye Near:   Left Eye Near:    Bilateral Near:     Physical Exam Vitals and nursing note reviewed.  Constitutional:      General: She is not in acute distress.    Appearance: She is well-developed.  HENT:     Head: Normocephalic and atraumatic.   Eyes:     Conjunctiva/sclera: Conjunctivae normal.  Cardiovascular:     Rate and Rhythm: Normal rate and regular rhythm.     Heart sounds: No murmur heard. Pulmonary:     Effort: Pulmonary effort is normal. No respiratory distress.     Breath sounds: Normal breath sounds.  Abdominal:     Palpations: Abdomen is soft.     Tenderness: There is no abdominal tenderness.  Genitourinary:    Comments: Exam deferred Musculoskeletal:        General: No swelling.     Cervical back: Neck supple.  Skin:    General: Skin is warm and dry.     Capillary Refill: Capillary refill takes less than 2 seconds.  Neurological:     Mental Status: She is alert.  Psychiatric:        Mood and Affect: Mood normal.     UC Treatments / Results  Labs (all labs ordered are listed, but only abnormal results are displayed) Labs Reviewed  POC URINE PREG, ED  CERVICOVAGINAL ANCILLARY ONLY    EKG   Radiology No results found.  Procedures Procedures (including critical care time)  Medications Ordered in UC Medications - No data to display  Initial Impression / Assessment and Plan / UC Course  I have reviewed the triage vital signs and the nursing notes.  Pertinent labs & imaging results that were available during my care of the patient were reviewed by me and considered in my medical decision making (see chart for details).     History of chlamydia infection -only completed 10 of the 14 pills.  Wants retesting today prior to resuming sexual activity. Vaginal discharge -patient has history of BV as well.  Send out Aptima swab, call with results and treat as indicated Final Clinical Impressions(s) / UC Diagnoses   Final diagnoses:  History of chlamydia infection  Vaginal discharge     Discharge Instructions  Your pregnancy test is negative. We will call with the results of your vaginal swab. If still positive for chlamydia, we will try Azithromycin 1g. Please continue to abstain from  intercourse until your results are obtained. Always use a condom or other form of protection to prevent an sexually transmitted disease.     ED Prescriptions   None    PDMP not reviewed this encounter.   Chaney Malling, [place] 02/19/21 2223

## 2021-02-20 ENCOUNTER — Telehealth (HOSPITAL_COMMUNITY): Payer: Self-pay | Admitting: Emergency Medicine

## 2021-02-20 LAB — CERVICOVAGINAL ANCILLARY ONLY
Bacterial Vaginitis (gardnerella): POSITIVE — AB
Candida Glabrata: NEGATIVE
Candida Vaginitis: NEGATIVE
Chlamydia: NEGATIVE
Comment: NEGATIVE
Comment: NEGATIVE
Comment: NEGATIVE
Comment: NEGATIVE
Comment: NEGATIVE
Comment: NORMAL
Neisseria Gonorrhea: NEGATIVE
Trichomonas: NEGATIVE

## 2021-02-20 MED ORDER — METRONIDAZOLE 500 MG PO TABS: 500.0000 mg | ORAL_TABLET | Freq: Two times a day (BID) | ORAL | 0 refills | Status: DC

## 2021-03-20 ENCOUNTER — Telehealth: Payer: Self-pay | Admitting: Family Medicine

## 2021-03-20 ENCOUNTER — Other Ambulatory Visit: Payer: Self-pay

## 2021-03-20 ENCOUNTER — Ambulatory Visit (HOSPITAL_COMMUNITY)
Admission: EM | Admit: 2021-03-20 | Discharge: 2021-03-20 | Disposition: A | Discharge status: Home / Self Care | Payer: Medicaid Other | Attending: Internal Medicine | Admitting: Internal Medicine

## 2021-03-20 ENCOUNTER — Encounter (HOSPITAL_COMMUNITY): Payer: Self-pay

## 2021-03-20 DIAGNOSIS — Z3201 Encounter for pregnancy test, result positive: Secondary | ICD-10-CM | POA: Diagnosis present

## 2021-03-20 DIAGNOSIS — Z202 Contact with and (suspected) exposure to infections with a predominantly sexual mode of transmission: Secondary | ICD-10-CM

## 2021-03-20 LAB — POC URINE PREG, ED: Preg Test, Ur: POSITIVE — AB

## 2021-03-20 NOTE — ED Provider Notes (Signed)
[facility]    CSN: [id number] Arrival date & time: 03/20/21  1102      History   Chief Complaint Chief Complaint  Patient presents with   SEXUALLY TRANSMITTED DISEASE    testing   Amenorrhea    Missed period    HPI Madeline Wilkins is a 26 y.o. female.   Patient presents with concerns about clear slimy vaginal discharge and missed menses.  Endorses associated decreased appetite and fatigue.  Last known menstrual.  01/22/2021.  Sexually active, 1 partner, sometimes condom use.  Denies vaginal irritation, itching, urinary frequency, urgency, hematuria, dysuria, lower abdominal pain or pressure, flank pain, fever, chills, nausea, vomiting, diarrhea.   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   Patient will need to be screened every 3 months 02/2015 nonreactive; exposed in Summer 2016, former partner incarcerated March 2017 [x]  NR July 2017 [x]  NR   Sept 2017 [x]  NR   Infection    UTI   Kidney stone    Trichomonal infection    Urinary tract infection 02/23/2015    Patient Active Problem List   Diagnosis Date Noted   Hyperprolactinemia ([place]) 07/20/2020   Pregnancy of unknown anatomic location 01/15/2016    Past Surgical History:  Procedure Laterality Date   INDUCED ABORTION     REMOVAL OF IMPLANON ROD  07/26/2019    OB History     Gravida  2   Para  1   Term  1   Preterm      AB  1   Living  1      SAB      IAB  1   Ectopic      Multiple  0   Live Births  1        Obstetric Comments  G2: mid December 2017 in office D&C (elective AB)          Home Medications    Prior to Admission medications   Medication Sig Start Date End Date Taking? Authorizing Provider  bromocriptine (PARLODEL) 2.5 MG tablet Take 1 tablet (2.5 mg total) by mouth at bedtime. Patient not taking: Reported on 01/08/2021 07/20/20   Renato Shin, MD  metroNIDAZOLE (FLAGYL) 500 MG tablet Take 1 tablet (500 mg total) by mouth  2 (two) times daily. Patient not taking: Reported on 03/20/2021 02/20/21   Chase Picket, MD    Family History Family History  Problem Relation Age of Onset   Healthy Mother    Healthy Father    Cancer Neg Hx    Diabetes Neg Hx    Heart disease Neg Hx    Hypertension Neg Hx    Stroke Neg Hx    Hearing loss Neg Hx    Asthma Neg Hx    Other Neg Hx        elev prolactin    Social History Social History   Tobacco Use   Smoking status: Never   Smokeless tobacco: Never  Vaping Use   Vaping Use: Never used  Substance Use Topics   Alcohol use: Yes   Drug use: No     Allergies   Shellfish allergy   Review of Systems Review of Systems  Constitutional:  Positive for appetite change and fatigue. Negative for activity change, chills, fever and unexpected weight change.  HENT: Negative.    Respiratory: Negative.    Cardiovascular: Negative.   Genitourinary:  Positive for menstrual problem and vaginal  discharge. Negative for decreased urine volume, difficulty urinating, dyspareunia, dysuria, enuresis, flank pain, frequency, genital sores, hematuria, pelvic pain, urgency, vaginal bleeding and vaginal pain.  Skin: Negative.   Neurological: Negative.     Physical Exam Triage Vital Signs ED Triage Vitals  Enc Vitals Group     BP 03/20/21 1108 121/80     Pulse Rate 03/20/21 1108 82     Resp 03/20/21 1108 14     Temp 03/20/21 1108 99.3 F (37.4 C)     Temp Source 03/20/21 1108 Oral     SpO2 03/20/21 1108 98 %     Weight --      Height --      Head Circumference --      Peak Flow --      Pain Score 03/20/21 1110 0     Pain Loc --      Pain Edu? --      Excl. in [place]? --    No data found.  Updated Vital Signs BP 121/80 (BP Location: Left Arm)    Pulse 82    Temp 99.3 F (37.4 C) (Oral)    Resp 14    LMP 01/22/2021 (Approximate)    SpO2 98%   Visual Acuity Right Eye Distance:   Left Eye Distance:   Bilateral Distance:    Right Eye Near:   Left Eye Near:     Bilateral Near:     Physical Exam Constitutional:      Appearance: Normal appearance.  HENT:     Head: Normocephalic.  Eyes:     Extraocular Movements: Extraocular movements intact.  Pulmonary:     Effort: Pulmonary effort is normal.  Genitourinary:    Comments: deferred Skin:    General: Skin is warm and dry.  Neurological:     Mental Status: She is alert and oriented to person, place, and time. Mental status is at baseline.  Psychiatric:        Mood and Affect: Mood normal.        Behavior: Behavior normal.     UC Treatments / Results  Labs (all labs ordered are listed, but only abnormal results are displayed) Labs Reviewed  POC URINE PREG, ED - Abnormal; Notable for the following components:      Result Value   Preg Test, Ur POSITIVE (*)    All other components within normal limits  CERVICOVAGINAL ANCILLARY ONLY    EKG   Radiology No results found.  Procedures Procedures (including critical care time)  Medications Ordered in UC Medications - No data to display  Initial Impression / Assessment and Plan / UC Course  I have reviewed the triage vital signs and the nursing notes.  Pertinent labs & imaging results that were available during my care of the patient were reviewed by me and considered in my medical decision making (see chart for details).  Positive pregnancy test  Discussed test results with patient, confirmed by urine, STI screening pending, will treat per protocol, advised abstinence until lab results, and given patient list of over-the-counter safe medications, instructed patient if planning to move forward with pregnancy may begin prenatal, endorses that she has a gynecologist, patient to notify to establish prenatal care, also given information for the local health department and maternity admissions unit Final Clinical Impressions(s) / UC Diagnoses   Final diagnoses:  None   Discharge Instructions   None    ED Prescriptions   None     PDMP not reviewed this  encounter.   Hans Eden, NP 03/20/21 1552

## 2021-03-20 NOTE — Discharge Instructions (Signed)
Pregnancy test positive  If plan to move forward with pregnancy please let your gynecologist know so that you may establish prenatal care, you may also establish prenatal care with the local health department, information listed below  You may begin use of daily prenatal vitamins if planning to move forward with pregnancy  At any point if you begin to have vaginal bleeding, painful abdominal cramping please go to the maternity admissions unit for evaluation, information listed below  Labs pending 2-3 days, you will be contacted if positive for any sti and treatment will be sent to the pharmacy, you will have to return to the clinic if positive for gonorrhea to receive treatment   Please refrain from having sex until labs results, if positive please refrain from having sex until treatment complete and symptoms resolve   If positive for  Chlamydia  gonorrhea or trichomoniasis please notify partner or partners so they may tested as well  Moving forward, it is recommended you use some form of protection against the transmission of sti infections  such as condoms or dental dams with each sexual encounter

## 2021-03-20 NOTE — Telephone Encounter (Signed)
Patient called requesting a Ultra [place] to see how far along she is, she went to Urgent care for her pregnancy test.

## 2021-03-20 NOTE — ED Triage Notes (Signed)
Pt presents for STI testing, missed period, and BV follow up.

## 2021-03-21 LAB — CERVICOVAGINAL ANCILLARY ONLY
Bacterial Vaginitis (gardnerella): NEGATIVE
Candida Glabrata: NEGATIVE
Candida Vaginitis: NEGATIVE
Chlamydia: NEGATIVE
Comment: NEGATIVE
Comment: NEGATIVE
Comment: NEGATIVE
Comment: NEGATIVE
Comment: NEGATIVE
Comment: NORMAL
Neisseria Gonorrhea: NEGATIVE
Trichomonas: NEGATIVE

## 2021-03-21 NOTE — Telephone Encounter (Signed)
Left message that I am returning her call if she continues to have questions or concerns to please give the office a call.   Leonette Nutting  03/21/21

## 2021-03-21 NOTE — Telephone Encounter (Signed)
Pt returned call and pt wanted to know how far a long she is.  I asked pt when her LMP is.  Pt stated that it was about 01/16/21.  I advised pt that at this time with her approximated LMP she stated she is 9w 1d and her EDD is 10/23/21.  I informed pt that once she gets established with an OB they will determine if she needs an U/S.  Pt reports that she does not have insurance at this time.  I informed pt that I will request the front office to call her with an NEW OB intake appt and NEW OB provider appt.  Pt requests a call after 1530 as she should be off of work.  [place] office notified to contact pt.    Frances Nickels  03/21/21

## 2021-05-29 ENCOUNTER — Ambulatory Visit (HOSPITAL_COMMUNITY)
Admission: EM | Admit: 2021-05-29 | Discharge: 2021-05-29 | Disposition: A | Discharge status: Home / Self Care | Payer: Commercial Managed Care - HMO | Attending: Emergency Medicine | Admitting: Emergency Medicine

## 2021-05-29 ENCOUNTER — Other Ambulatory Visit: Payer: Self-pay

## 2021-05-29 ENCOUNTER — Encounter (HOSPITAL_COMMUNITY): Payer: Self-pay

## 2021-05-29 DIAGNOSIS — N898 Other specified noninflammatory disorders of vagina: Secondary | ICD-10-CM | POA: Diagnosis present

## 2021-05-29 LAB — POC URINE PREG, ED: Preg Test, Ur: NEGATIVE

## 2021-05-29 LAB — HIV ANTIBODY (ROUTINE TESTING W REFLEX): HIV Screen 4th Generation wRfx: NONREACTIVE

## 2021-05-29 MED ORDER — METRONIDAZOLE 0.75 % VA GEL: 1.0000 | Freq: Every day | VAGINAL | 0 refills | Status: AC

## 2021-05-29 NOTE — Discharge Instructions (Addendum)
Today you are being treated prophylactically for  Bacterial vaginosis  ? ?Pregnancy test negative  ? ?Take Metronidazole daily before bed ? ?Bacterial vaginosis which results from an overgrowth of one on several organisms that are normally present in your vagina. Vaginosis is an inflammation of the vagina that can result in discharge, itching and pain. ? ?Labs pending 2-3 days, you will be contacted if positive for any sti and treatment will be sent to the pharmacy, you will have to return to the clinic if positive for gonorrhea to receive treatment  ? ?Please refrain from having sex until labs results, if positive please refrain from having sex until treatment complete and symptoms resolve  ? ?If positive for HIV, Syphilis, Chlamydia  gonorrhea or trichomoniasis please notify partner or partners so they may tested as well ? ?Moving forward, it is recommended you use some form of protection against the transmission of sti infections  such as condoms or dental dams with each sexual encounter   ? ? ?In addition: ?Avoid baths, hot tubs and whirlpool spas.  ?Don't use scented or harsh soaps ?Avoid irritants. These include scented tampons and pads. ?Wipe from front to back after using the toilet. ?Don't douche. Your vagina doesn't require cleansing other than normal bathing.  ?Use a condom.  ?Wear cotton underwear, this fabric absorbs some moisture.  ? ? ?  ?

## 2021-05-29 NOTE — ED Triage Notes (Signed)
Pt states possible yeast or BV infection. Pt denies any dysuria.  ? ?Pt requested pregnancy test. ?

## 2021-05-29 NOTE — ED Provider Notes (Signed)
?[facility] ? ? ? ?CSN: [id number] ?Arrival date & time: 05/29/21  1231 ? ? ?  ? ?History   ?Chief Complaint ?Chief Complaint  ?Patient presents with  ? Vaginitis  ? ? ?HPI ?Madeline Wilkins is a 26 y.o. female.  ?, ?Presents with a Franklyn Cafaro mushy discharge, mild itching and vaginal odor.  Sexually active, 1 partner, sometimes condom use.  No known exposure.  Endorses irregular.  With last cycle of bleeding 05/07/2021.  Requesting pregnancy test.  Denies urinary frequency, urgency, dysuria, hematuria, lower abdominal pain or pressure, flank pain, new rash or lesions. ? ? ?Past Medical History:  ?Diagnosis Date  ? Gonorrhea   ? 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  ? Patient will need to be screened every 3 months 02/2015 nonreactive; exposed in Summer 2016, former partner incarcerated March 2017 [x]  NR July 2017 [x]  NR   Sept 2017 [x]  NR  ? Infection   ? UTI  ? Kidney stone   ? Trichomonal infection   ? Urinary tract infection 02/23/2015  ? ? ?Patient Active Problem List  ? Diagnosis Date Noted  ? Hyperprolactinemia ([place]) 07/20/2020  ? Pregnancy of unknown anatomic location 01/15/2016  ? ? ?Past Surgical History:  ?Procedure Laterality Date  ? INDUCED ABORTION    ? REMOVAL OF IMPLANON ROD  07/26/2019  ? ? ?OB History   ? ? Gravida  ?2  ? Para  ?1  ? Term  ?1  ? Preterm  ?   ? AB  ?1  ? Living  ?1  ?  ? ? SAB  ?   ? IAB  ?1  ? Ectopic  ?   ? Multiple  ?0  ? Live Births  ?1  ?   ?  ? Obstetric Comments  ?G2: mid December 2017 in office D&C (elective AB)  ?  ? ?  ? ? ? ?Home Medications   ? ?Prior to Admission medications   ?Medication Sig Start Date End Date Taking? Authorizing Provider  ?bromocriptine (PARLODEL) 2.5 MG tablet Take 1 tablet (2.5 mg total) by mouth at bedtime. ?Patient not taking: Reported on 01/08/2021 07/20/20   Renato Shin, MD  ?metroNIDAZOLE (FLAGYL) 500 MG tablet Take 1 tablet (500 mg total) by mouth 2 (two) times daily. ?Patient not taking: Reported on 03/20/2021 02/20/21   Chase Picket, MD  ? ? ?Family  History ?Family History  ?Problem Relation Age of Onset  ? Healthy Mother   ? Healthy Father   ? Cancer Neg Hx   ? Diabetes Neg Hx   ? Heart disease Neg Hx   ? Hypertension Neg Hx   ? Stroke Neg Hx   ? Hearing loss Neg Hx   ? Asthma Neg Hx   ? Other Neg Hx   ?     elev prolactin  ? ? ?Social History ?Social History  ? ?Tobacco Use  ? Smoking status: Never  ? Smokeless tobacco: Never  ?Vaping Use  ? Vaping Use: Never used  ?Substance Use Topics  ? Alcohol use: Yes  ? Drug use: No  ? ? ? ?Allergies   ?Shellfish allergy ? ? ?Review of Systems ?Review of Systems  ?Constitutional: Negative.   ?Respiratory: Negative.    ?Cardiovascular: Negative.   ?Genitourinary:  Positive for vaginal pain. Negative for decreased urine volume, difficulty urinating, dyspareunia, dysuria, enuresis, flank pain, frequency, genital sores, hematuria, menstrual problem, pelvic pain, urgency, vaginal bleeding and vaginal discharge.  ?Neurological: Negative.   ? ? ?Physical Exam ?Triage  Vital Signs ?ED Triage Vitals  ?Enc Vitals Group  ?   BP 05/29/21 1301 116/76  ?   Pulse Rate 05/29/21 1301 68  ?   Resp 05/29/21 1301 18  ?   Temp 05/29/21 1301 98.9 ?F (37.2 ?C)  ?   Temp Source 05/29/21 1301 Oral  ?   SpO2 05/29/21 1301 96 %  ?   Weight --   ?   Height --   ?   Head Circumference --   ?   Peak Flow --   ?   Pain Score 05/29/21 1258 0  ?   Pain Loc --   ?   Pain Edu? --   ?   Excl. in [place]? --   ? ?No data found. ? ?Updated Vital Signs ?BP 116/76 (BP Location: Right Arm)   Pulse 68   Temp 98.9 ?F (37.2 ?C) (Oral)   Resp 18   LMP 05/07/2021   SpO2 96%  ? ?Visual Acuity ?Right Eye Distance:   ?Left Eye Distance:   ?Bilateral Distance:   ? ?Right Eye Near:   ?Left Eye Near:    ?Bilateral Near:    ? ?Physical Exam ?Constitutional:   ?   Appearance: Normal appearance.  ?HENT:  ?   Head: Normocephalic.  ?Eyes:  ?   Extraocular Movements: Extraocular movements intact.  ?Pulmonary:  ?   Effort: Pulmonary effort is normal.  ?Genitourinary: ?    Comments: deferred ?Skin: ?   General: Skin is warm and dry.  ?Neurological:  ?   Mental Status: She is alert and oriented to person, place, and time. Mental status is at baseline.  ?Psychiatric:     ?   Mood and Affect: Mood normal.     ?   Behavior: Behavior normal.  ? ? ? ?UC Treatments / Results  ?Labs ?(all labs ordered are listed, but only abnormal results are displayed) ?Labs Reviewed  ?POC URINE PREG, ED  ?CERVICOVAGINAL ANCILLARY ONLY  ? ? ?EKG ? ? ?Radiology ?No results found. ? ?Procedures ?Procedures (including critical care time) ? ?Medications Ordered in UC ?Medications - No data to display ? ?Initial Impression / Assessment and Plan / UC Course  ?I have reviewed the triage vital signs and the nursing notes. ? ?Pertinent labs & imaging results that were available during my care of the patient were reviewed by me and considered in my medical decision making (see chart for details). ? ?Vaginal discharge ? ?We will prophylactically treat for BV as patient has had reoccurring infections, MetroGel prescribed for 7 days, STI labs pending, will treat per protocol, advised abstinence until all lab results, treatment is complete and symptoms have resolved, urine pregnancy test negative, discussed findings with patient, may follow-up with urgent care as needed for persisting symptoms ?Final Clinical Impressions(s) / UC Diagnoses  ? ?Final diagnoses:  ?None  ? ?Discharge Instructions   ?None ?  ? ?ED Prescriptions   ?None ?  ? ?PDMP not reviewed this encounter. ?  ?Hans Eden, NP ?05/29/21 1334 ? ?

## 2021-05-30 LAB — CERVICOVAGINAL ANCILLARY ONLY
Bacterial Vaginitis (gardnerella): POSITIVE — AB
Candida Glabrata: NEGATIVE
Candida Vaginitis: POSITIVE — AB
Chlamydia: NEGATIVE
Comment: NEGATIVE
Comment: NEGATIVE
Comment: NEGATIVE
Comment: NEGATIVE
Comment: NEGATIVE
Comment: NORMAL
Neisseria Gonorrhea: NEGATIVE
Trichomonas: POSITIVE — AB

## 2021-05-30 LAB — RPR: RPR Ser Ql: NONREACTIVE

## 2021-05-31 ENCOUNTER — Telehealth (HOSPITAL_COMMUNITY): Payer: Self-pay | Admitting: Emergency Medicine

## 2021-05-31 MED ORDER — FLUCONAZOLE 150 MG PO TABS: 150.0000 mg | ORAL_TABLET | Freq: Once | ORAL | 0 refills | Status: AC

## 2021-05-31 MED ORDER — METRONIDAZOLE 500 MG PO TABS: 500.0000 mg | ORAL_TABLET | Freq: Two times a day (BID) | ORAL | 0 refills | Status: DC

## 2021-06-16 ENCOUNTER — Encounter: Payer: Self-pay | Admitting: Endocrinology

## 2021-07-11 ENCOUNTER — Encounter (HOSPITAL_COMMUNITY): Payer: Self-pay | Admitting: Emergency Medicine

## 2021-07-11 ENCOUNTER — Ambulatory Visit (HOSPITAL_COMMUNITY)
Admission: EM | Admit: 2021-07-11 | Discharge: 2021-07-11 | Disposition: A | Discharge status: Home / Self Care | Payer: Commercial Managed Care - HMO | Attending: Family Medicine | Admitting: Family Medicine

## 2021-07-11 DIAGNOSIS — N898 Other specified noninflammatory disorders of vagina: Secondary | ICD-10-CM | POA: Insufficient documentation

## 2021-07-11 DIAGNOSIS — Z113 Encounter for screening for infections with a predominantly sexual mode of transmission: Secondary | ICD-10-CM | POA: Insufficient documentation

## 2021-07-11 MED ORDER — FLUCONAZOLE 150 MG PO TABS: ORAL_TABLET | ORAL | 0 refills | Status: DC

## 2021-07-11 NOTE — ED Triage Notes (Signed)
Patient wants STI testing. Patient reports vaginal discharge x 10 days.  ? ?Patient reports discharge hasn't changed , and reports "clumpy:" discharge.  ? ?Patient reports wearing condoms since last appointment.  ? ?Patient endorses being treated for STI. Patient is currently taking Flagyl and reports having nausea from medication.  ? ?Patient denies any ABD pain, dysuria, or pelvic pain.  ? ?Patient is requesting anti nausea medication.  ?

## 2021-07-11 NOTE — ED Provider Notes (Signed)
?[facility] ? ? ?[id number] ?07/11/21 Arrival Time: 1011 ? ?ASSESSMENT & PLAN: ? ?1. Vaginal discharge   ?2. Screening for STD (sexually transmitted disease)   ? ?Currently taking Flagyl. Desires test of cure. Feels yeast infection may have returned. ? ?Meds ordered this encounter  ?Medications  ? fluconazole (DIFLUCAN) 150 MG tablet  ?  Sig: Take one tablet by mouth as a single dose. May repeat in 3 days if symptoms persist.  ?  Dispense:  2 tablet  ?  Refill:  0  ? ? ? ? ?Discharge Instructions   ? ?  ?We have sent testing for sexually transmitted infections. We will notify you of any positive results once they are received. If required, we will prescribe any medications you might need. ? ?Please refrain from all sexual activity for at least the next seven days. ? ? ? ? ?Without s/s of PID. ? ?Labs Reviewed  ?CERVICOVAGINAL ANCILLARY ONLY  ? ? ?Will notify of any positive results. Instructed to refrain from sexual activity for at least seven days. ? ?Reviewed expectations re: course of current medical issues. Questions answered. ?Outlined signs and symptoms indicating need for more acute intervention. ?Patient verbalized understanding. ?After Visit Summary given. ? ? ?SUBJECTIVE: ? ?Madeline Wilkins is a 26 y.o. female who presents with complaint of vaginal discharge. Has been recently treated for BV/yeast. Feels yeast may be back. White/clumpy discharge. Is sexually active with condom use. ? ? ?OBJECTIVE: ? ?Vitals:  ? 07/11/21 1039  ?BP: 113/68  ?Pulse: 72  ?Resp: 16  ?Temp: 98.8 ?F (37.1 ?C)  ?TempSrc: Oral  ?SpO2: 96%  ?  ?General appearance: alert, cooperative, appears stated age and no distress ?Lungs: unlabored respirations; speaks full sentences without difficulty ?Back: no CVA tenderness; FROM at waist ?Abdomen: soft, non-tender ?GU: deferred ?Skin: warm and dry ?Psychological: alert and cooperative; normal mood and affect. ? ?Results for orders placed or performed during the hospital encounter of  05/29/21  ?HIV Antibody (routine testing w rflx)  ?Result Value Ref Range  ? HIV Screen 4th Generation wRfx Non Reactive Non Reactive  ?RPR  ?Result Value Ref Range  ? RPR Ser Ql NON REACTIVE NON REACTIVE  ?POC urine pregnancy  ?Result Value Ref Range  ? Preg Test, Ur NEGATIVE NEGATIVE  ?Cervicovaginal ancillary only  ?Result Value Ref Range  ? Neisseria Gonorrhea Negative   ? Chlamydia Negative   ? Trichomonas Positive (A)   ? Bacterial Vaginitis (gardnerella) Positive (A)   ? Candida Vaginitis Positive (A)   ? Candida Glabrata Negative   ? Comment    ?  Normal Reference Range Bacterial Vaginosis - Negative  ? Comment Normal Reference Range Candida Species - Negative   ? Comment Normal Reference Range Candida Galbrata - Negative   ? Comment Normal Reference Range Trichomonas - Negative   ? Comment Normal Reference Ranger Chlamydia - Negative   ? Comment    ?  Normal Reference Range Neisseria Gonorrhea - Negative  ? ? ?Labs Reviewed  ?CERVICOVAGINAL ANCILLARY ONLY  ? ? ?Allergies  ?Allergen Reactions  ? Shellfish Allergy   ? ? ?Past Medical History:  ?Diagnosis Date  ? Gonorrhea   ? 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  ? Patient will need to be screened every 3 months 02/2015 nonreactive; exposed in Summer 2016, former partner incarcerated March 2017 [x]  NR July 2017 [x]  NR   Sept 2017 [x]  NR  ? Infection   ? UTI  ? Kidney stone   ? Trichomonal infection   ?  Urinary tract infection 02/23/2015  ? ?Family History  ?Problem Relation Age of Onset  ? Healthy Mother   ? Healthy Father   ? Cancer Neg Hx   ? Diabetes Neg Hx   ? Heart disease Neg Hx   ? Hypertension Neg Hx   ? Stroke Neg Hx   ? Hearing loss Neg Hx   ? Asthma Neg Hx   ? Other Neg Hx   ?     elev prolactin  ? ?Social History  ? ?Socioeconomic History  ? Marital status: Single  ?  Spouse name: Not on file  ? Number of children: Not on file  ? Years of education: Not on file  ? Highest education level: Not on file  ?Occupational History  ? Not on file  ?Tobacco Use  ?  Smoking status: Never  ? Smokeless tobacco: Never  ?Vaping Use  ? Vaping Use: Never used  ?Substance and Sexual Activity  ? Alcohol use: Yes  ? Drug use: No  ? Sexual activity: Yes  ?  Birth control/protection: None  ?Other Topics Concern  ? Not on file  ?Social History Narrative  ? Not on file  ? ?Social Determinants of Health  ? ?Financial Resource Strain: Not on file  ?Food Insecurity: Not on file  ?Transportation Needs: Not on file  ?Physical Activity: Not on file  ?Stress: Not on file  ?Social Connections: Not on file  ?Intimate Partner Violence: Not on file  ? ? ? ? ? ? ? ?  ?Mardella Layman, MD ?07/11/21 1100 ? ?

## 2021-07-11 NOTE — Discharge Instructions (Signed)
We have sent testing for sexually transmitted infections. We will notify you of any positive results once they are received. If required, we will prescribe any medications you might need.  Please refrain from all sexual activity for at least the next seven days.  

## 2021-07-12 LAB — CERVICOVAGINAL ANCILLARY ONLY
Bacterial Vaginitis (gardnerella): POSITIVE — AB
Candida Glabrata: NEGATIVE
Candida Vaginitis: POSITIVE — AB
Chlamydia: NEGATIVE
Comment: NEGATIVE
Comment: NEGATIVE
Comment: NEGATIVE
Comment: NEGATIVE
Comment: NEGATIVE
Comment: NORMAL
Neisseria Gonorrhea: NEGATIVE
Trichomonas: NEGATIVE

## 2021-07-13 ENCOUNTER — Telehealth (HOSPITAL_COMMUNITY): Payer: Self-pay | Admitting: Emergency Medicine

## 2021-07-13 MED ORDER — METRONIDAZOLE 500 MG PO TABS: 500.0000 mg | ORAL_TABLET | Freq: Two times a day (BID) | ORAL | 0 refills | Status: DC

## 2021-08-03 ENCOUNTER — Ambulatory Visit (HOSPITAL_COMMUNITY): Payer: Self-pay

## 2021-09-05 ENCOUNTER — Ambulatory Visit (HOSPITAL_COMMUNITY)
Admission: RE | Admit: 2021-09-05 | Discharge: 2021-09-05 | Disposition: A | Discharge status: Home / Self Care | Payer: Commercial Managed Care - HMO | Source: Ambulatory Visit | Attending: Emergency Medicine | Admitting: Emergency Medicine

## 2021-09-05 ENCOUNTER — Encounter (HOSPITAL_COMMUNITY): Payer: Self-pay

## 2021-09-05 ENCOUNTER — Ambulatory Visit (HOSPITAL_COMMUNITY): Payer: Self-pay

## 2021-09-05 VITALS — BP 131/75 | HR 80 | Temp 98.9°F | Resp 18

## 2021-09-05 DIAGNOSIS — N898 Other specified noninflammatory disorders of vagina: Secondary | ICD-10-CM | POA: Diagnosis present

## 2021-09-05 DIAGNOSIS — Z113 Encounter for screening for infections with a predominantly sexual mode of transmission: Secondary | ICD-10-CM | POA: Insufficient documentation

## 2021-09-05 LAB — POCT URINALYSIS DIPSTICK, ED / UC
Bilirubin Urine: NEGATIVE
Glucose, UA: NEGATIVE mg/dL
Hgb urine dipstick: NEGATIVE
Ketones, ur: NEGATIVE mg/dL
Nitrite: NEGATIVE
Protein, ur: NEGATIVE mg/dL
Specific Gravity, Urine: >=1.03 (ref 1.005–1.030)
Urobilinogen, UA: 0.2 mg/dL (ref 0.0–1.0)
pH: 5.5 (ref 5.0–8.0)

## 2021-09-05 LAB — POC URINE PREG, ED
Preg Test, Ur: NEGATIVE
Preg Test, Ur: NEGATIVE

## 2021-09-05 MED ORDER — FLUCONAZOLE 150 MG PO TABS: 150.0000 mg | ORAL_TABLET | Freq: Once | ORAL | 0 refills | Status: DC | PRN

## 2021-09-05 MED ORDER — FLUCONAZOLE 150 MG PO TABS: ORAL_TABLET | ORAL | 0 refills | Status: DC

## 2021-09-05 NOTE — ED Triage Notes (Signed)
Pt c/o white thick vaginal discharge with irritation x2 days. Requesting STD and pregnancy testing.

## 2021-09-05 NOTE — ED Provider Notes (Signed)
[facility]    CSN: [id number] Arrival date & time: 09/05/21  0846     History   Chief Complaint Chief Complaint  Patient presents with   Exposure to STD    I have thick cotton discharge. It might just be yeast and this the peak season for it - Entered by patient   Vaginal Discharge    HPI Madeline Wilkins is a 26 y.o. female.  Presents with 2-day history of increased thick white vaginal discharge with irritation.  Believes it is yeast infection. Denies abdominal pain, urinary symptoms, fever, back or flank pain.  No vomiting/diarrhea.  LMP 6/22 Requesting STD testing and pregnancy test.  History of gonorrhea, trichomonas, HIV exposure with non reactive tests.  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   Patient will need to be screened every 3 months 02/2015 nonreactive; exposed in Summer 2016, former partner incarcerated March 2017 [x]  NR July 2017 [x]  NR   Sept 2017 [x]  NR   Infection    UTI   Kidney stone    Trichomonal infection    Urinary tract infection 02/23/2015    Patient Active Problem List   Diagnosis Date Noted   Hyperprolactinemia (HCC) 07/20/2020   Pregnancy of unknown anatomic location 01/15/2016    Past Surgical History:  Procedure Laterality Date   INDUCED ABORTION     REMOVAL OF IMPLANON ROD  07/26/2019    OB History     Gravida  2   Para  1   Term  1   Preterm      AB  1   Living  1      SAB      IAB  1   Ectopic      Multiple  0   Live Births  1        Obstetric Comments  G2: mid December 2017 in office D&C (elective AB)          Home Medications    Prior to Admission medications   Medication Sig Start Date End Date Taking? Authorizing Provider  fluconazole (DIFLUCAN) 150 MG tablet Take 1 tablet (150 mg total) by mouth once as needed for up to 2 doses (take one pill on day 1, and the second pill 3 days later if symptoms do not improve). 09/05/21  Yes Timisha Mondry, 07/28/2019,  PA-C    Family History Family History  Problem Relation Age of Onset   Healthy Mother    Healthy Father    Cancer Neg Hx    Diabetes Neg Hx    Heart disease Neg Hx    Hypertension Neg Hx    Stroke Neg Hx    Hearing loss Neg Hx    Asthma Neg Hx    Other Neg Hx        elev prolactin    Social History Social History   Tobacco Use   Smoking status: Never   Smokeless tobacco: Never  Vaping Use   Vaping Use: Never used  Substance Use Topics   Alcohol use: Yes   Drug use: No     Allergies   Shellfish allergy   Review of Systems Review of Systems  Per HPI  Physical Exam Triage Vital Signs ED Triage Vitals  Enc Vitals Group     BP      Pulse      Resp      Temp      Temp src  SpO2      Weight      Height      Head Circumference      Peak Flow      Pain Score      Pain Loc      Pain Edu?      Excl. in GC?    No data found.  Updated Vital Signs BP 131/75 (BP Location: Left Arm)   Pulse 80   Temp 98.9 F (37.2 C) (Oral)   Resp 18   LMP 08/23/2021   SpO2 98%     Physical Exam Vitals and nursing note reviewed.  Constitutional:      Appearance: Normal appearance.  HENT:     Mouth/Throat:     Pharynx: Oropharynx is clear.  Eyes:     Conjunctiva/sclera: Conjunctivae normal.  Cardiovascular:     Rate and Rhythm: Normal rate and regular rhythm.     Heart sounds: Normal heart sounds.  Pulmonary:     Effort: Pulmonary effort is normal.     Breath sounds: Normal breath sounds.  Abdominal:     General: Bowel sounds are normal.     Tenderness: There is no abdominal tenderness. There is no guarding.  Genitourinary:    Comments: Deferred Neurological:     Mental Status: She is alert and oriented to person, place, and time.     UC Treatments / Results  Labs (all labs ordered are listed, but only abnormal results are displayed) Labs Reviewed  POCT URINALYSIS DIPSTICK, ED / UC - Abnormal; Notable for the following components:      Result  Value   Leukocytes,Ua TRACE (*)    All other components within normal limits  POC URINE PREG, ED  POC URINE PREG, ED  CERVICOVAGINAL ANCILLARY ONLY    EKG  Radiology No results found.  Procedures Procedures (including critical care time)  Medications Ordered in UC Medications - No data to display  Initial Impression / Assessment and Plan / UC Course  I have reviewed the triage vital signs and the nursing notes.  Pertinent labs & imaging results that were available during my care of the patient were reviewed by me and considered in my medical decision making (see chart for details).  Urine preg negative Urinalysis negative apart from trace leuks. No symptoms Cytology swab pending Treat for yeast with fluconazole, 2 dose Return precautions discussed. Patient agrees to plan and is discharged in stable condition.  Final Clinical Impressions(s) / UC Diagnoses   Final diagnoses:  Screen for STD (sexually transmitted disease)  Vaginal discharge     Discharge Instructions      Please take medication as prescribed. We will call you if the results of your swab return positive. Please abstain from sexual intercourse until your results return.     ED Prescriptions     Medication Sig Dispense Auth. Provider   Fluconazole 150 mg  2 tab Lurena Joiner Rhya Shan, PA-C      PDMP not reviewed this encounter.   Efstathios Sawin, Lurena Joiner, [place] 09/05/21 1038

## 2021-09-05 NOTE — Discharge Instructions (Addendum)
Please take medication as prescribed. We will call you if the results of your swab return positive. Please abstain from sexual intercourse until your results return.

## 2021-09-06 ENCOUNTER — Telehealth (HOSPITAL_COMMUNITY): Payer: Self-pay | Admitting: Emergency Medicine

## 2021-09-06 LAB — CERVICOVAGINAL ANCILLARY ONLY
Bacterial Vaginitis (gardnerella): POSITIVE — AB
Candida Glabrata: NEGATIVE
Candida Vaginitis: POSITIVE — AB
Chlamydia: NEGATIVE
Comment: NEGATIVE
Comment: NEGATIVE
Comment: NEGATIVE
Comment: NEGATIVE
Comment: NEGATIVE
Comment: NORMAL
Neisseria Gonorrhea: NEGATIVE
Trichomonas: NEGATIVE

## 2021-09-06 MED ORDER — METRONIDAZOLE 500 MG PO TABS: 500.0000 mg | ORAL_TABLET | Freq: Two times a day (BID) | ORAL | 0 refills | Status: DC

## 2021-10-24 ENCOUNTER — Telehealth (HOSPITAL_COMMUNITY): Payer: Self-pay | Admitting: Family Medicine

## 2021-10-24 ENCOUNTER — Ambulatory Visit (HOSPITAL_COMMUNITY)
Admission: EM | Admit: 2021-10-24 | Discharge: 2021-10-24 | Disposition: A | Discharge status: Home / Self Care | Payer: Commercial Managed Care - HMO | Attending: Internal Medicine | Admitting: Internal Medicine

## 2021-10-24 ENCOUNTER — Encounter (HOSPITAL_COMMUNITY): Payer: Self-pay | Admitting: *Deleted

## 2021-10-24 DIAGNOSIS — Z3201 Encounter for pregnancy test, result positive: Secondary | ICD-10-CM | POA: Diagnosis present

## 2021-10-24 DIAGNOSIS — Z20822 Contact with and (suspected) exposure to covid-19: Secondary | ICD-10-CM | POA: Diagnosis not present

## 2021-10-24 DIAGNOSIS — R197 Diarrhea, unspecified: Secondary | ICD-10-CM | POA: Diagnosis not present

## 2021-10-24 LAB — POC URINE PREG, ED: Preg Test, Ur: POSITIVE — AB

## 2021-10-24 LAB — SARS CORONAVIRUS 2 BY RT PCR: SARS Coronavirus 2 by RT PCR: NEGATIVE

## 2021-10-24 MED ORDER — PRENATAL VITAMIN 27-0.8 MG PO TABS: 1.0000 | ORAL_TABLET | Freq: Every day | ORAL | 2 refills | Status: DC

## 2021-10-24 MED ORDER — PRENATAL COMPLETE 14-0.4 MG PO TABS: 1.0000 | ORAL_TABLET | Freq: Every day | ORAL | 0 refills | Status: DC

## 2021-10-24 NOTE — ED Triage Notes (Signed)
Patient states that since she returned from the beach on Sunday she has been weak, had diarrhea that has mucous in it. She also is late with her period so she is worried she might be pregnant. She has just been trying to eat bland diet and stay hydrated.

## 2021-10-24 NOTE — ED Provider Notes (Signed)
[facility]   [id number] 10/24/21 Arrival Time: 0947  ASSESSMENT & PLAN:  1. Positive urine pregnancy test   2. Close exposure to COVID-19 virus    -Patient's urine pregnancy test was positive today.  We discussed this in detail.  She wants to take a prenatal vitamin while she figures out if this is a desired pregnancy or not.  We discussed local options to follow-up.  All questions were answered.  -COVID swab was collected in the office today.  We will call her if the result is positive.  Meds ordered this encounter  Medications   Prenatal Vit-Fe Fumarate-FA (PRENATAL COMPLETE) 14-0.4 MG TABS    Sig: Take 1 tablet by mouth daily.    Dispense:  60 tablet    Refill:  0     Discharge Instructions      Your pregnancy test was positive Take a prenatal vitamin daily Do not smoke, drink alcohol, or do drugs Call       Follow-up Information     Gynecology, [facility] 4 [facility].   Specialty: Obstetrics and Gynecology Contact information: [address] [place] [address] [phone]                  Reviewed expectations re: course of current medical issues. Questions answered. Outlined signs and symptoms indicating need for more acute intervention. Patient verbalized understanding. After Visit Summary given.   SUBJECTIVE: 26 year old female here for evaluation of diarrhea and late period.  Patient recently came back from the family trip to the beach.  She ate seafood there.  One of the people on the vacation had COVID so she was exposed to COVID.  About 2 days ago she started developing fatigue, weakness, and diarrhea.  She reports watery mucousy diarrhea for last 2 days.  She has been trying to eat bland foods but is still having diarrhea.  She denies any n/v, cough, nasal congestion, sore throat.  Patient also says she is late for her period.  She says she is about 8 days late.  She reports multiple unprotected sexual  encounters lately. She has a history of 4 prior pregnancies, 1 living daughter and 3 abortions, [id number]. Last pregnancy was in January of this year.  Patient's last menstrual period was 09/16/2021 (exact date). Past Surgical History:  Procedure Laterality Date   INDUCED ABORTION     REMOVAL OF IMPLANON ROD  07/26/2019     OBJECTIVE:  Vitals:   10/24/21 1038  BP: 102/74  Pulse: 99  Resp: 18  Temp: 99.3 F (37.4 C)  TempSrc: Oral  SpO2: 96%     Physical Exam Vitals reviewed.  Constitutional:      General: She is not in acute distress. Cardiovascular:     Rate and Rhythm: Normal rate.     Heart sounds: Normal heart sounds.  Pulmonary:     Effort: Pulmonary effort is normal.     Breath sounds: Normal breath sounds.  Abdominal:     General: Abdomen is flat. There is no distension.     Palpations: Abdomen is soft.     Tenderness: There is no abdominal tenderness. There is no guarding or rebound.  Musculoskeletal:        General: Normal range of motion.     Cervical back: Normal range of motion.  Skin:    General: Skin is warm.  Neurological:     General: No focal deficit present.     Mental Status:  She is alert.      Labs: Results for orders placed or performed during the hospital encounter of 10/24/21  POC urine pregnancy  Result Value Ref Range   Preg Test, Ur POSITIVE (A) NEGATIVE   Labs Reviewed  POC URINE PREG, ED - Abnormal; Notable for the following components:      Result Value   Preg Test, Ur POSITIVE (*)    All other components within normal limits  SARS CORONAVIRUS 2 BY RT PCR    Imaging: No results found.   Allergies  Allergen Reactions   Shellfish Allergy                                                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   Patient will need to be screened every 3 months 02/2015 nonreactive; exposed in Summer 2016, former partner incarcerated March 2017 [x]  NR July 2017 [x]  NR    Sept 2017 [x]  NR   Infection    UTI   Kidney stone    Trichomonal infection    Urinary tract infection 02/23/2015    Social History   Socioeconomic History   Marital status: Single    Spouse name: Not on file   Number of children: Not on file   Years of education: Not on file   Highest education level: Not on file  Occupational History   Not on file  Tobacco Use   Smoking status: Never   Smokeless tobacco: Never  Vaping Use   Vaping Use: Never used  Substance and Sexual Activity   Alcohol use: Yes   Drug use: No   Sexual activity: Yes    Birth control/protection: None  Other Topics Concern   Not on file  Social History Narrative   Not on file   Social Determinants of Health   Financial Resource Strain: Not on file  Food Insecurity: Not on file  Transportation Needs: Not on file  Physical Activity: Not on file  Stress: Not on file  Social Connections: Not on file  Intimate Partner Violence: Not on file    Family History  Problem Relation Age of Onset   Healthy Mother    Healthy Father    Cancer Neg Hx    Diabetes Neg Hx    Heart disease Neg Hx    Hypertension Neg Hx    Stroke Neg Hx    Hearing loss Neg Hx    Asthma Neg Hx    Other Neg Hx        elev prolactin      Isador Castille, 07-25-1997, MD 10/24/21 1113

## 2021-10-24 NOTE — Discharge Instructions (Addendum)
Your pregnancy test was positive Take a prenatal vitamin daily Do not smoke, drink alcohol, or do drugs Call to make an appt with OB Will call you if COVID test is positive

## 2021-10-24 NOTE — Telephone Encounter (Signed)
Pt was sent in a pnv today. She called demanding a different one that medicaid would cover. I searched through the current [place] medicaid PDL, and I could not find any vitamin, prenatal or otherwise.   Rx sent for different prenatal vitamin

## 2021-11-28 ENCOUNTER — Other Ambulatory Visit (HOSPITAL_COMMUNITY)
Admission: RE | Admit: 2021-11-28 | Discharge: 2021-11-28 | Disposition: A | Discharge status: Home / Self Care | Payer: Commercial Managed Care - HMO | Source: Ambulatory Visit | Attending: Family Medicine | Admitting: Family Medicine

## 2021-11-28 ENCOUNTER — Ambulatory Visit (INDEPENDENT_AMBULATORY_CARE_PROVIDER_SITE_OTHER): Payer: Commercial Managed Care - HMO

## 2021-11-28 DIAGNOSIS — Z3490 Encounter for supervision of normal pregnancy, unspecified, unspecified trimester: Secondary | ICD-10-CM | POA: Insufficient documentation

## 2021-11-28 DIAGNOSIS — Z31430 Encounter of female for testing for genetic disease carrier status for procreative management: Secondary | ICD-10-CM

## 2021-11-28 DIAGNOSIS — Z3491 Encounter for supervision of normal pregnancy, unspecified, first trimester: Secondary | ICD-10-CM

## 2021-11-28 DIAGNOSIS — Z3493 Encounter for supervision of normal pregnancy, unspecified, third trimester: Secondary | ICD-10-CM | POA: Insufficient documentation

## 2021-11-28 DIAGNOSIS — Z3481 Encounter for supervision of other normal pregnancy, first trimester: Secondary | ICD-10-CM

## 2021-11-28 DIAGNOSIS — A749 Chlamydial infection, unspecified: Secondary | ICD-10-CM

## 2021-11-28 HISTORY — DX: Chlamydial infection, unspecified: A74.9

## 2021-11-28 NOTE — Progress Notes (Signed)
Informal bedside ultrasound performed to assess FHR. FHR 177bpm

## 2021-11-28 NOTE — Progress Notes (Signed)
New OB Intake  Patient came in person for her new OB intake.  I discussed the limitations, risks, security and privacy concerns of performing an evaluation and management service by telephone and the availability of in person appointments. I also discussed with the patient that there may be a patient responsible charge related to this service. The patient expressed understanding and agreed to proceed.  I explained I am completing New OB Intake today. We discussed her EDD of 06/04/2022 that is based on LMP of 09/17/2021. Pt is G4/P1. I reviewed her allergies, medications, Medical/Surgical/OB history, and appropriate screenings. I informed her of [facility] services. [facility] information placed in AVS. Based on history, this is a/an  pregnancy uncomplicated .   Patient Active Problem List   Diagnosis Date Noted   Encounter for supervision of low-risk pregnancy 11/28/2021   Hyperprolactinemia ([place]) 07/20/2020   Pregnancy of unknown anatomic location 01/15/2016    Concerns addressed today  Delivery Plans Plans to deliver at [facility]. Patient given information for [facility] Healthy Baby website for more information about [facility] and [place]. Patient is not interested in water birth. Offered upcoming OB visit with CNM to discuss further.  MyChart/Babyscripts MyChart access verified. I explained pt will have some visits in office and some virtually. Babyscripts instructions given and order placed. Patient verifies receipt of registration text/e-mail. Account successfully created and app downloaded.  Blood Pressure Cuff/Weight Scale Will order a patient a blood pressure scale once her medicaid is approve.   Anatomy US Explained first scheduled [place] will be around 19 weeks. Anatomy US scheduled for 01/28/2022 at 09:45 AM. Pt notified to arrive at 09:30 AM.  Labs Patient had her initial OB labs, OB culture, Genetic screening, hemoglobin a1c completed.   Covid Vaccine Patient has not covid vaccine.    Is patient a CenteringPregnancy candidate?  Declined Declined due to Schedule/Times    Is patient a Mom+Baby Combined Care candidate?  Declined     Social Determinants of Health Food Insecurity: Patient denies food insecurity. WIC Referral: Patient is interested in referral to [facility].  Transportation: Patient denies transportation needs. Childcare: Discussed no children allowed at ultrasound appointments. Offered childcare services; patient declines childcare services at this time.  First visit review I reviewed new OB appt with pt. I explained she will have a provider visit that includes pap smear and pelvic exam. Explained pt will be seen by Clayton Lefort MD at first visit; encounter routed to appropriate provider. Explained that patient will be seen by pregnancy navigator following visit with provider.   Mariane Baumgarten, [place] 11/28/2021  4:20 PM

## 2021-11-29 ENCOUNTER — Other Ambulatory Visit: Payer: Commercial Managed Care - HMO

## 2021-11-29 ENCOUNTER — Other Ambulatory Visit: Payer: Self-pay | Admitting: Family Medicine

## 2021-11-29 ENCOUNTER — Other Ambulatory Visit: Payer: Self-pay

## 2021-11-29 ENCOUNTER — Encounter: Payer: Self-pay | Admitting: Family Medicine

## 2021-11-29 ENCOUNTER — Ambulatory Visit (INDEPENDENT_AMBULATORY_CARE_PROVIDER_SITE_OTHER): Payer: Commercial Managed Care - HMO

## 2021-11-29 VITALS — BP 131/73 | HR 90

## 2021-11-29 DIAGNOSIS — Z3491 Encounter for supervision of normal pregnancy, unspecified, first trimester: Secondary | ICD-10-CM

## 2021-11-29 DIAGNOSIS — Z111 Encounter for screening for respiratory tuberculosis: Secondary | ICD-10-CM

## 2021-11-29 DIAGNOSIS — A749 Chlamydial infection, unspecified: Secondary | ICD-10-CM

## 2021-11-29 LAB — CBC/D/PLT+RPR+RH+ABO+RUBIGG...
Antibody Screen: NEGATIVE
Basophils Absolute: 0 10*3/uL (ref 0.0–0.2)
Basos: 0 %
EOS (ABSOLUTE): 0.1 10*3/uL (ref 0.0–0.4)
Eos: 1 %
HCV Ab: NONREACTIVE
HIV Screen 4th Generation wRfx: NONREACTIVE
Hematocrit: 37.4 % (ref 34.0–46.6)
Hemoglobin: 12.3 g/dL (ref 11.1–15.9)
Hepatitis B Surface Ag: NEGATIVE
Immature Grans (Abs): 0 10*3/uL (ref 0.0–0.1)
Immature Granulocytes: 0 %
Lymphocytes Absolute: 1.4 10*3/uL (ref 0.7–3.1)
Lymphs: 22 %
MCH: 31.9 pg (ref 26.6–33.0)
MCHC: 32.9 g/dL (ref 31.5–35.7)
MCV: 97 fL (ref 79–97)
Monocytes Absolute: 0.5 10*3/uL (ref 0.1–0.9)
Monocytes: 9 %
Neutrophils Absolute: 4.2 10*3/uL (ref 1.4–7.0)
Neutrophils: 68 %
Platelets: 271 10*3/uL (ref 150–450)
RBC: 3.85 x10E6/uL (ref 3.77–5.28)
RDW: 12.1 % (ref 11.7–15.4)
RPR Ser Ql: NONREACTIVE
Rh Factor: POSITIVE
Rubella Antibodies, IGG: 1.81 index (ref >0.99)
WBC: 6.1 10*3/uL (ref 3.4–10.8)

## 2021-11-29 LAB — CERVICOVAGINAL ANCILLARY ONLY
Bacterial Vaginitis (gardnerella): POSITIVE — AB
Candida Glabrata: NEGATIVE
Candida Vaginitis: NEGATIVE
Chlamydia: POSITIVE — AB
Comment: NEGATIVE
Comment: NEGATIVE
Comment: NEGATIVE
Comment: NEGATIVE
Comment: NEGATIVE
Comment: NORMAL
Neisseria Gonorrhea: NEGATIVE
Trichomonas: NEGATIVE

## 2021-11-29 LAB — HEMOGLOBIN A1C
Est. average glucose Bld gHb Est-mCnc: 100 mg/dL
Hgb A1c MFr Bld: 5.1 % (ref 4.8–5.6)

## 2021-11-29 LAB — HCV INTERPRETATION

## 2021-11-29 MED ORDER — AZITHROMYCIN 250 MG PO TABS: 1000.0000 mg | ORAL_TABLET | Freq: Once | ORAL | 0 refills | Status: AC

## 2021-11-29 MED ORDER — METRONIDAZOLE 0.75 % VA GEL: 1.0000 | Freq: Every day | VAGINAL | 0 refills | Status: AC

## 2021-11-29 NOTE — Progress Notes (Addendum)
Madeline Wilkins here for lab appt and requests to speak with RN regarding test results. Pt to nurse room. Reviewed recent results of positive Chlamydia. Offered treatment while patient is in office. Pt desires treatment. Zithromax 1 g given per protocol. Reviewed Metrogel is available at pharmacy for BV.   Communicable disease report faxed to health department.  Annabell Howells, RN 11/29/2021  3:54 PM

## 2021-11-30 DIAGNOSIS — Z3491 Encounter for supervision of normal pregnancy, unspecified, first trimester: Secondary | ICD-10-CM | POA: Diagnosis not present

## 2021-11-30 DIAGNOSIS — A749 Chlamydial infection, unspecified: Secondary | ICD-10-CM | POA: Diagnosis not present

## 2021-11-30 DIAGNOSIS — O98819 Other maternal infectious and parasitic diseases complicating pregnancy, unspecified trimester: Secondary | ICD-10-CM

## 2021-11-30 LAB — URINE CULTURE, OB REFLEX: Organism ID, Bacteria: NO GROWTH

## 2021-11-30 LAB — CULTURE, OB URINE

## 2021-11-30 MED ORDER — AZITHROMYCIN 500 MG PO TABS
1000.0000 mg | ORAL_TABLET | Freq: Once | ORAL | Status: AC
  Administered 2021-11-30: 1000 mg via ORAL

## 2021-12-04 ENCOUNTER — Telehealth: Payer: Self-pay | Admitting: Family Medicine

## 2021-12-04 LAB — QUANTIFERON-TB GOLD PLUS
QuantiFERON Mitogen Value: >10 IU/mL
QuantiFERON Nil Value: 0.1 IU/mL
QuantiFERON TB1 Ag Value: 0.09 IU/mL
QuantiFERON TB2 Ag Value: 0.1 IU/mL
QuantiFERON-TB Gold Plus: NEGATIVE

## 2021-12-04 LAB — PANORAMA PRENATAL TEST FULL PANEL:PANORAMA TEST PLUS 5 ADDITIONAL MICRODELETIONS: FETAL FRACTION: 8.4

## 2021-12-04 NOTE — Telephone Encounter (Signed)
Patient calling for test results for "TB"

## 2021-12-05 LAB — HORIZON CUSTOM: REPORT SUMMARY: NEGATIVE

## 2021-12-05 NOTE — Telephone Encounter (Signed)
Per chart review, pt has read the Mychart message from Dr. Dione Plover notifying her of negative TB test result.

## 2021-12-12 ENCOUNTER — Other Ambulatory Visit: Payer: Self-pay

## 2021-12-12 ENCOUNTER — Other Ambulatory Visit (HOSPITAL_COMMUNITY)
Admission: RE | Admit: 2021-12-12 | Discharge: 2021-12-12 | Disposition: A | Discharge status: Home / Self Care | Payer: Commercial Managed Care - HMO | Source: Ambulatory Visit | Attending: Family Medicine | Admitting: Family Medicine

## 2021-12-12 ENCOUNTER — Ambulatory Visit (INDEPENDENT_AMBULATORY_CARE_PROVIDER_SITE_OTHER): Payer: Commercial Managed Care - HMO | Admitting: Obstetrics & Gynecology

## 2021-12-12 ENCOUNTER — Encounter: Payer: Self-pay | Admitting: Obstetrics & Gynecology

## 2021-12-12 VITALS — BP 114/73 | HR 89 | Wt 156.0 lb

## 2021-12-12 DIAGNOSIS — R7989 Other specified abnormal findings of blood chemistry: Secondary | ICD-10-CM

## 2021-12-12 DIAGNOSIS — N760 Acute vaginitis: Secondary | ICD-10-CM | POA: Diagnosis present

## 2021-12-12 DIAGNOSIS — O23591 Infection of other part of genital tract in pregnancy, first trimester: Secondary | ICD-10-CM

## 2021-12-12 DIAGNOSIS — Z3A12 12 weeks gestation of pregnancy: Secondary | ICD-10-CM

## 2021-12-12 DIAGNOSIS — A749 Chlamydial infection, unspecified: Secondary | ICD-10-CM

## 2021-12-12 DIAGNOSIS — O98811 Other maternal infectious and parasitic diseases complicating pregnancy, first trimester: Secondary | ICD-10-CM

## 2021-12-12 DIAGNOSIS — Z3491 Encounter for supervision of normal pregnancy, unspecified, first trimester: Secondary | ICD-10-CM

## 2021-12-12 DIAGNOSIS — Z8639 Personal history of other endocrine, nutritional and metabolic disease: Secondary | ICD-10-CM

## 2021-12-12 MED ORDER — TERCONAZOLE 0.4 % VA CREA: 1.0000 | TOPICAL_CREAM | Freq: Every day | VAGINAL | 0 refills | Status: DC

## 2021-12-12 NOTE — Progress Notes (Signed)
History:   Day'Jai Scalf is a 26 y.o. [id number] at [redacted]w[redacted]d by LMP being seen today for her first obstetrical visit.  Her obstetrical history is significant for  one term vaginal delivery and two IABs.  Recently diagnosed with chlamydia which was treated, she feels she has a yeast infection currently . Patient reports history of having elevated prolactin levels about two years ago, no recent follow up, no concerning symptoms. Pregnancy history fully reviewed.      HISTORY: OB History  Gravida Para Term Preterm AB Living  4 1 1  0 2 1  SAB IAB Ectopic Multiple Live Births  0 2 0 0 1    # Outcome Date GA Lbr Len/2nd Weight Sex Delivery Anes PTL Lv  4 Current           3 IAB 2023 [redacted]w[redacted]d         2 Term 09/17/15 [redacted]w[redacted]d 08:09 / 00:04 7 lb 12.5 oz (3.53 kg) F Vag-Spont EPI  LIV     Name: [facility]     Apgar1: 9  Apgar5: 9  1 IAB 2017            Obstetric Comments  G2: mid December 2017 in office D&C (elective AB)   Last pap smear was done 07/26/2019 and was normal  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   Patient will need to be screened every 3 months 02/2015 nonreactive; exposed in Summer 2016, former partner incarcerated March 2017 [x]  NR July 2017 [x]  NR   Sept 2017 [x]  NR   Infection    UTI   Kidney stone    Trichomonal infection    Urinary tract infection 02/23/2015   Past Surgical History:  Procedure Laterality Date   INDUCED ABORTION     Family History  Problem Relation Age of Onset   Healthy Mother    Healthy Father    Cancer Neg Hx    Diabetes Neg Hx    Heart disease Neg Hx    Hypertension Neg Hx    Stroke Neg Hx    Hearing loss Neg Hx    Asthma Neg Hx    Other Neg Hx        elev prolactin   Social History   Tobacco Use   Smoking status: Never   Smokeless tobacco: Never  Vaping Use   Vaping Use: Never used  Substance Use Topics   Alcohol use: Not Currently   Drug use: No   Allergies   Allergen Reactions   Shellfish Allergy    Current Outpatient Medications on File Prior to Visit  Medication Sig Dispense Refill   Prenatal Vit-Fe Fumarate-FA (PRENATAL VITAMIN) 27-0.8 MG TABS Take 1 tablet by mouth daily. 30 tablet 2   No current facility-administered medications on file prior to visit.    Review of Systems Pertinent items noted in HPI and remainder of comprehensive ROS otherwise negative.  Physical Exam:   Vitals:   12/12/21 1015  BP: 114/73  Pulse: 89  Weight: 156 lb (70.8 kg)   Fetal Heart Rate (bpm): 154   General: well-developed, well-nourished female in no acute distress  Breasts:  deferred  Skin: normal coloration and turgor, no rashes  Neurologic: oriented, normal, negative, normal mood  Extremities: normal strength, tone, and muscle mass, ROM of all joints is normal  HEENT PERRLA, extraocular movement intact and sclera clear, anicteric  Neck supple and no masses  Cardiovascular: regular rate and rhythm  Respiratory:  no respiratory distress, normal breath sounds  Abdomen: soft, non-tender; bowel sounds normal; no masses,  no organomegaly  Pelvic: deferred    Assessment:    Pregnancy: [id number] Patient Active Problem List   Diagnosis Date Noted   Chlamydia infection affecting pregnancy 11/29/2021   Encounter for supervision of low-risk pregnancy 11/28/2021   Hyperprolactinemia (HCC) 07/20/2020     Plan:    1. Chlamydia infection affecting pregnancy in first trimester - Cervicovaginal ancillary only( [place]) done today, will follow up results and manage accordingly.  2. Vaginitis during pregnancy in first trimester Testing done (self-swab), will follow up results and manage accordingly. Terazol presumptively ordered. - Cervicovaginal ancillary only( [place]) - terconazole (TERAZOL 7) 0.4 % vaginal cream; Place 1 applicator vaginally at bedtime. Use for seven days  Dispense: 45 g; Refill: 0  3. History of hyperprolactinemia Will  check prolactin level. If elevated, patient is aware for need for fasting level. - Prolactin  4. [redacted] weeks gestation of pregnancy 5. Encounter for supervision of low-risk pregnancy in first trimester - Comprehensive metabolic panel - Enroll Patient in PreNatal Babyscripts Initial labs drawn during intake visit reviewed. Continue prenatal vitamins. Problem list reviewed and updated. Genetic Screening discussed, Panorama and Horizon: results reviewed. Ultrasound discussed; fetal anatomic survey: scheduled. Anticipatory guidance about prenatal visits given including labs, ultrasounds, and testing. Discussed usage of the Babyscripts app for more information about pregnancy, and to track blood pressures. Also discussed usage of virtual visits as additional source of managing and completing prenatal visits.  Patient was encouraged to use MyChart to review results, send requests, and have questions addressed.   The nature of [facility]ulty [facility] with multiple MDs and Advanced Practice Providers was explained to patient; also emphasized that residents, students are part of our team. Routine obstetric precautions reviewed. Encouraged to seek out care at our office or emergency room [facility] preferred) for urgent and/or emergent concerns. Return in about 4 weeks (around 01/09/2022) for OFFICE OB VISIT (MD or APP).     Jaynie Collins, MD, FACOG Obstetrician & Gynecologist, [facility] for [employer], [facility]

## 2021-12-13 ENCOUNTER — Telehealth: Payer: Self-pay | Admitting: *Deleted

## 2021-12-13 ENCOUNTER — Encounter: Payer: Self-pay | Admitting: *Deleted

## 2021-12-13 LAB — COMPREHENSIVE METABOLIC PANEL
ALT: 12 IU/L (ref 0–32)
AST: 13 IU/L (ref 0–40)
Albumin/Globulin Ratio: 1.5 (ref 1.2–2.2)
Albumin: 3.8 g/dL — ABNORMAL LOW (ref 4.0–5.0)
Alkaline Phosphatase: 47 IU/L (ref 44–121)
BUN/Creatinine Ratio: 13 (ref 9–23)
BUN: 9 mg/dL (ref 6–20)
Bilirubin Total: 0.4 mg/dL (ref 0.0–1.2)
CO2: 20 mmol/L (ref 20–29)
Calcium: 9.1 mg/dL (ref 8.7–10.2)
Chloride: 103 mmol/L (ref 96–106)
Creatinine, Ser: 0.68 mg/dL (ref 0.57–1.00)
Globulin, Total: 2.5 g/dL (ref 1.5–4.5)
Glucose: 83 mg/dL (ref 70–99)
Potassium: 4.7 mmol/L (ref 3.5–5.2)
Sodium: 135 mmol/L (ref 134–144)
Total Protein: 6.3 g/dL (ref 6.0–8.5)
eGFR: 122 mL/min/{1.73_m2} (ref >59)

## 2021-12-13 LAB — CERVICOVAGINAL ANCILLARY ONLY
Bacterial Vaginitis (gardnerella): POSITIVE — AB
Candida Glabrata: NEGATIVE
Candida Vaginitis: NEGATIVE
Chlamydia: NEGATIVE
Comment: NEGATIVE
Comment: NEGATIVE
Comment: NEGATIVE
Comment: NEGATIVE
Comment: NEGATIVE
Comment: NORMAL
Neisseria Gonorrhea: NEGATIVE
Trichomonas: NEGATIVE

## 2021-12-13 LAB — PROLACTIN: Prolactin: 80.1 ng/mL — ABNORMAL HIGH (ref 4.8–23.3)

## 2021-12-13 MED ORDER — METRONIDAZOLE 500 MG PO TABS: 500.0000 mg | ORAL_TABLET | Freq: Two times a day (BID) | ORAL | 0 refills | Status: AC

## 2021-12-13 NOTE — Telephone Encounter (Addendum)
-----   Message from Osborne Oman, MD sent at 12/13/2021  8:25 AM EDT ----- Patient needs to come back and get a fasting prolactin level drawn.  If still elevated, will do further evaluation with possible brain MRI. Please call to inform patient of results and recommendations.  10/12  1520  Called pt and left message that I am calling to schedule a lab test appointment. Pt was asked to check her Mychart for a detailed message and respond with her choice of appt date and time.

## 2021-12-13 NOTE — Addendum Note (Signed)
Addended by: Verita Schneiders A on: 12/13/2021 03:49 PM   Modules accepted: Orders

## 2021-12-13 NOTE — Addendum Note (Signed)
Addended by: Verita Schneiders A on: 12/13/2021 08:25 AM   Modules accepted: Orders

## 2021-12-23 ENCOUNTER — Ambulatory Visit (HOSPITAL_COMMUNITY): Payer: Commercial Managed Care - HMO

## 2021-12-28 ENCOUNTER — Other Ambulatory Visit (HOSPITAL_COMMUNITY)
Admission: RE | Admit: 2021-12-28 | Discharge: 2021-12-28 | Disposition: A | Discharge status: Home / Self Care | Payer: Commercial Managed Care - HMO | Source: Ambulatory Visit | Attending: Family Medicine | Admitting: Family Medicine

## 2021-12-28 ENCOUNTER — Ambulatory Visit (INDEPENDENT_AMBULATORY_CARE_PROVIDER_SITE_OTHER): Payer: Commercial Managed Care - HMO

## 2021-12-28 ENCOUNTER — Other Ambulatory Visit: Payer: Self-pay

## 2021-12-28 ENCOUNTER — Other Ambulatory Visit: Payer: Commercial Managed Care - HMO

## 2021-12-28 VITALS — BP 112/67 | HR 88

## 2021-12-28 DIAGNOSIS — A749 Chlamydial infection, unspecified: Secondary | ICD-10-CM | POA: Diagnosis present

## 2021-12-28 DIAGNOSIS — N760 Acute vaginitis: Secondary | ICD-10-CM

## 2021-12-28 DIAGNOSIS — R7989 Other specified abnormal findings of blood chemistry: Secondary | ICD-10-CM

## 2021-12-28 DIAGNOSIS — O98819 Other maternal infectious and parasitic diseases complicating pregnancy, unspecified trimester: Secondary | ICD-10-CM | POA: Insufficient documentation

## 2021-12-28 DIAGNOSIS — O23591 Infection of other part of genital tract in pregnancy, first trimester: Secondary | ICD-10-CM

## 2021-12-28 DIAGNOSIS — Z8639 Personal history of other endocrine, nutritional and metabolic disease: Secondary | ICD-10-CM

## 2021-12-28 MED ORDER — METRONIDAZOLE 0.75 % VA GEL: 1.0000 | Freq: Every day | VAGINAL | Status: AC

## 2021-12-28 NOTE — Progress Notes (Signed)
Patient presents today for self swab. Pt educated in how to use self swab. Swab obtained. Pt informed she would be notified of results in 24-48 hours via MyChart. Pt verbalized understanding.   Pt treated for Chlamydia 11/29/2021. Pt states she completed treatment and partner completed treatment as far as she knows.   Pt did self swab on 12/12/2021 and came back positive for BV. Pt was written for Flagyl at this time. She says she has not taken the Flagyl as it makes her nauseous. She is requesting Metrogel. Metrogel sent to pharmacy on file per protocol.   Pt agreeable with plan of care and denied further questions. Pt aware of next prenatal appointment on 11/13 at 0915.   Darlyne Russian, RN

## 2021-12-29 LAB — PROLACTIN: Prolactin: 116 ng/mL — ABNORMAL HIGH (ref 4.8–23.3)

## 2021-12-29 NOTE — Progress Notes (Signed)
Patient has elevated fasting prolactin level of 116 ng/ml. MR of brain ordered to evaluate pituitary gland, will follow up results and manage accordingly.    Verita Schneiders, MD

## 2021-12-31 ENCOUNTER — Encounter: Payer: Self-pay | Admitting: *Deleted

## 2021-12-31 ENCOUNTER — Telehealth: Payer: Self-pay | Admitting: *Deleted

## 2021-12-31 NOTE — Telephone Encounter (Addendum)
-----   Message from Osborne Oman, MD sent at 12/29/2021  8:53 AM EDT ----- Patient has elevated fasting prolactin level of 116 ng/ml. MR of brain ordered to evaluate pituitary gland, will follow up results and manage accordingly. Please call to inform patient of results and recommendations.  10/30  Called pt and she did not answer. I left message stating that I will contact her via Mychart regarding an appointment to be scheduled.

## 2022-01-01 ENCOUNTER — Telehealth: Payer: Self-pay | Admitting: Family Medicine

## 2022-01-01 LAB — CERVICOVAGINAL ANCILLARY ONLY
Bacterial Vaginitis (gardnerella): NEGATIVE
Candida Glabrata: NEGATIVE
Candida Vaginitis: NEGATIVE
Chlamydia: NEGATIVE
Comment: NEGATIVE
Comment: NEGATIVE
Comment: NEGATIVE
Comment: NEGATIVE
Comment: NEGATIVE
Comment: NORMAL
Neisseria Gonorrhea: NEGATIVE
Trichomonas: NEGATIVE

## 2022-01-01 NOTE — Telephone Encounter (Signed)
Addressed via telephone encounter

## 2022-01-01 NOTE — Telephone Encounter (Signed)
Called pt; results reviewed. MR scheduled for 01/17/22.  Patient inquires about vaginal swab results. Not resulted. [facility] who states specimen is currently in machine processing, awaiting candida result. Explained to patient we will send her a message with a result.

## 2022-01-01 NOTE — Telephone Encounter (Signed)
Addressed in separate encounter.

## 2022-01-01 NOTE — Telephone Encounter (Signed)
Patient called in returning the missed call from yesterday.

## 2022-01-05 DIAGNOSIS — F321 Major depressive disorder, single episode, moderate: Secondary | ICD-10-CM | POA: Diagnosis not present

## 2022-01-09 ENCOUNTER — Encounter: Payer: Self-pay | Admitting: *Deleted

## 2022-01-12 DIAGNOSIS — F321 Major depressive disorder, single episode, moderate: Secondary | ICD-10-CM | POA: Diagnosis not present

## 2022-01-14 ENCOUNTER — Encounter: Payer: Commercial Managed Care - HMO | Admitting: Family Medicine

## 2022-01-14 NOTE — Progress Notes (Signed)
This encounter was created in error - please disregard.

## 2022-01-17 ENCOUNTER — Ambulatory Visit
Admission: RE | Admit: 2022-01-17 | Discharge: 2022-01-17 | Disposition: A | Discharge status: Home / Self Care | Payer: Commercial Managed Care - HMO | Source: Ambulatory Visit | Attending: Obstetrics & Gynecology | Admitting: Obstetrics & Gynecology

## 2022-01-17 DIAGNOSIS — R7989 Other specified abnormal findings of blood chemistry: Secondary | ICD-10-CM

## 2022-01-18 DIAGNOSIS — F321 Major depressive disorder, single episode, moderate: Secondary | ICD-10-CM | POA: Diagnosis not present

## 2022-01-19 DIAGNOSIS — F321 Major depressive disorder, single episode, moderate: Secondary | ICD-10-CM | POA: Diagnosis not present

## 2022-01-21 DIAGNOSIS — F321 Major depressive disorder, single episode, moderate: Secondary | ICD-10-CM | POA: Diagnosis not present

## 2022-01-28 ENCOUNTER — Ambulatory Visit: Payer: Commercial Managed Care - HMO

## 2022-01-28 ENCOUNTER — Ambulatory Visit: Payer: Commercial Managed Care - HMO | Attending: Family Medicine

## 2022-01-28 DIAGNOSIS — Z3A19 19 weeks gestation of pregnancy: Secondary | ICD-10-CM | POA: Insufficient documentation

## 2022-01-28 DIAGNOSIS — Z3491 Encounter for supervision of normal pregnancy, unspecified, first trimester: Secondary | ICD-10-CM | POA: Diagnosis not present

## 2022-01-28 DIAGNOSIS — Z363 Encounter for antenatal screening for malformations: Secondary | ICD-10-CM | POA: Insufficient documentation

## 2022-01-29 ENCOUNTER — Other Ambulatory Visit: Payer: Self-pay

## 2022-01-29 DIAGNOSIS — Z362 Encounter for other antenatal screening follow-up: Secondary | ICD-10-CM

## 2022-01-31 DIAGNOSIS — F321 Major depressive disorder, single episode, moderate: Secondary | ICD-10-CM | POA: Diagnosis not present

## 2022-02-02 DIAGNOSIS — F321 Major depressive disorder, single episode, moderate: Secondary | ICD-10-CM | POA: Diagnosis not present

## 2022-02-06 DIAGNOSIS — F321 Major depressive disorder, single episode, moderate: Secondary | ICD-10-CM | POA: Diagnosis not present

## 2022-02-08 DIAGNOSIS — F321 Major depressive disorder, single episode, moderate: Secondary | ICD-10-CM | POA: Diagnosis not present

## 2022-02-12 ENCOUNTER — Telehealth: Payer: Self-pay

## 2022-02-12 NOTE — Telephone Encounter (Signed)
Alert received through NCNotify system as follows: Patient identified as not having a recommended prenatal visit. Patient is 20 weeks and 5 days pregnant. Most recent prenatal visit was on 12/28/2021 at [facility].  Chart review: Patient's last visit was on 12/12/21. Patient was seen on 12/28/21 by RN. Pt no showed routine OB visit on 01/14/22. Pt does not have any upcoming prenatal visits scheduled.  Action needed: Called pt; VM left offering to reschedule appts.  Marjo Bicker, RN 02/12/2022  11:36 AM

## 2022-02-15 DIAGNOSIS — F321 Major depressive disorder, single episode, moderate: Secondary | ICD-10-CM | POA: Diagnosis not present

## 2022-02-18 ENCOUNTER — Encounter: Payer: Self-pay | Admitting: *Deleted

## 2022-02-21 DIAGNOSIS — F321 Major depressive disorder, single episode, moderate: Secondary | ICD-10-CM | POA: Diagnosis not present

## 2022-02-22 DIAGNOSIS — F321 Major depressive disorder, single episode, moderate: Secondary | ICD-10-CM | POA: Diagnosis not present

## 2022-02-28 ENCOUNTER — Ambulatory Visit: Payer: Commercial Managed Care - HMO

## 2022-03-04 NOTE — L&D Delivery Note (Signed)
OB/GYN Faculty Practice Delivery Note  Madeline Wilkins is a 27 y.o. [id number] s/p SVD at [redacted]w[redacted]d. She was admitted for SOL.   ROM: rupture date, rupture time, delivery date, or delivery time have not been documented with clear fluid GBS Status:  Negative/-- (03/27 1655) Maximum Maternal Temperature:  Temp (24hrs), Avg:98 F (36.7 C), Min:97.8 F (36.6 C), Max:98.2 F (36.8 C)    Labor Progress: Patient arrived at 5 cm dilation and progressed spontaneously.   Delivery Date/Time: 06/23/2022 at 0701 Delivery: Called to room and patient was complete and pushing. Head delivered in LOA position. No nuchal cord present. Shoulder and body delivered in usual fashion. Infant with spontaneous cry, placed on mother's abdomen, dried and stimulated. Cord clamped x 2 after 1-minute delay, and cut by provider. Cord blood drawn. Placenta delivered spontaneously with gentle cord traction. Fundus firm with massage and Pitocin. Labia, perineum, vagina, and cervix inspected with 1st degree perineal laceration which extended into right sulcus.   Placenta: Spontaneous, intact, 3 vessel cord  Complications: None Lacerations: 1st degree perineal laceration which extended into right sulcus EBL: 197 mL Analgesia: Lidocaine    Infant: APGAR (1 MIN): 7   APGAR (5 MINS): 10   APGAR (10 MINS):    Weight: Pending   Derrel Nip, MD  OB Fellow  06/23/2022 7:55 AM

## 2022-03-06 DIAGNOSIS — F321 Major depressive disorder, single episode, moderate: Secondary | ICD-10-CM | POA: Diagnosis not present

## 2022-03-08 DIAGNOSIS — F321 Major depressive disorder, single episode, moderate: Secondary | ICD-10-CM | POA: Diagnosis not present

## 2022-03-13 DIAGNOSIS — F321 Major depressive disorder, single episode, moderate: Secondary | ICD-10-CM | POA: Diagnosis not present

## 2022-03-15 DIAGNOSIS — F321 Major depressive disorder, single episode, moderate: Secondary | ICD-10-CM | POA: Diagnosis not present

## 2022-03-18 ENCOUNTER — Ambulatory Visit: Payer: Commercial Managed Care - HMO | Attending: Maternal & Fetal Medicine

## 2022-03-18 DIAGNOSIS — O99282 Endocrine, nutritional and metabolic diseases complicating pregnancy, second trimester: Secondary | ICD-10-CM

## 2022-03-18 DIAGNOSIS — Z362 Encounter for other antenatal screening follow-up: Secondary | ICD-10-CM | POA: Insufficient documentation

## 2022-03-18 DIAGNOSIS — E221 Hyperprolactinemia: Secondary | ICD-10-CM

## 2022-03-18 DIAGNOSIS — Z3A26 26 weeks gestation of pregnancy: Secondary | ICD-10-CM | POA: Diagnosis not present

## 2022-03-21 DIAGNOSIS — F321 Major depressive disorder, single episode, moderate: Secondary | ICD-10-CM | POA: Diagnosis not present

## 2022-03-23 DIAGNOSIS — F321 Major depressive disorder, single episode, moderate: Secondary | ICD-10-CM | POA: Diagnosis not present

## 2022-03-25 ENCOUNTER — Encounter: Payer: Self-pay | Admitting: Family Medicine

## 2022-03-27 ENCOUNTER — Encounter: Payer: Commercial Managed Care - HMO | Admitting: Obstetrics & Gynecology

## 2022-03-28 ENCOUNTER — Other Ambulatory Visit: Payer: Self-pay

## 2022-03-28 ENCOUNTER — Encounter: Payer: Self-pay | Admitting: Family Medicine

## 2022-03-28 ENCOUNTER — Ambulatory Visit (INDEPENDENT_AMBULATORY_CARE_PROVIDER_SITE_OTHER): Payer: Commercial Managed Care - HMO | Admitting: Family Medicine

## 2022-03-28 VITALS — BP 105/72 | HR 94 | Wt 180.8 lb

## 2022-03-28 DIAGNOSIS — Z3A27 27 weeks gestation of pregnancy: Secondary | ICD-10-CM

## 2022-03-28 DIAGNOSIS — Z3492 Encounter for supervision of normal pregnancy, unspecified, second trimester: Secondary | ICD-10-CM

## 2022-03-28 DIAGNOSIS — F321 Major depressive disorder, single episode, moderate: Secondary | ICD-10-CM | POA: Diagnosis not present

## 2022-03-28 DIAGNOSIS — E221 Hyperprolactinemia: Secondary | ICD-10-CM

## 2022-03-28 DIAGNOSIS — Z3493 Encounter for supervision of normal pregnancy, unspecified, third trimester: Secondary | ICD-10-CM

## 2022-03-28 NOTE — Progress Notes (Signed)
   Subjective:  Madeline Wilkins is a 27 y.o. G4P1021 at [redacted]w[redacted]d being seen today for ongoing prenatal care.  She is currently monitored for the following issues for this high-risk pregnancy and has Hyperprolactinemia ([place]) and Encounter for supervision of low-risk pregnancy on their problem list.  Patient reports no complaints.  Contractions: Not present. Vag. Bleeding: None.  Movement: Present. Denies leaking of fluid.   The following portions of the patient's history were reviewed and updated as appropriate: allergies, current medications, past family history, past medical history, past social history, past surgical history and problem list. Problem list updated.  Objective:   Vitals:   03/28/22 1043  BP: 105/72  Pulse: 94  Weight: 180 lb 12.8 oz (82 kg)    Fetal Status: Fetal Heart Rate (bpm): 156   Movement: Present     General:  Alert, oriented and cooperative. Patient is in no acute distress.  Skin: Skin is warm and dry. No rash noted.   Cardiovascular: Normal heart rate noted  Respiratory: Normal respiratory effort, no problems with respiration noted  Abdomen: Soft, gravid, appropriate for gestational age. Pain/Pressure: Absent     Pelvic: Vag. Bleeding: None     Cervical exam deferred        Extremities: Normal range of motion.  Edema: Trace  Mental Status: Normal mood and affect. Normal behavior. Normal judgment and thought content.   Urinalysis:      Assessment and Plan:  Pregnancy: G4P1021 at [redacted]w[redacted]d  1. Encounter for supervision of low-risk pregnancy in second trimester BP and FHR normal Will do 1 hr gtt and 3rd trimester labs Declines tdap Has gotten flu shot already  2. Hyperprolactinemia ([place]) Has not yet had brain imaging, reports she went but it was cancelled due to her being pregnant At this point makes more sense to get MRI brain w/wo contrast after delivery, she is in agreement with this plan  Preterm labor symptoms and general obstetric precautions  including but not limited to vaginal bleeding, contractions, leaking of fluid and fetal movement were reviewed in detail with the patient. Please refer to After Visit Summary for other counseling recommendations.  Return for [facility], ob visit.   Clarnce Flock, MD

## 2022-03-29 DIAGNOSIS — F321 Major depressive disorder, single episode, moderate: Secondary | ICD-10-CM | POA: Diagnosis not present

## 2022-03-29 LAB — CBC
Hematocrit: 32.5 % — ABNORMAL LOW (ref 34.0–46.6)
Hemoglobin: 11.1 g/dL (ref 11.1–15.9)
MCH: 31.8 pg (ref 26.6–33.0)
MCHC: 34.2 g/dL (ref 31.5–35.7)
MCV: 93 fL (ref 79–97)
Platelets: 225 10*3/uL (ref 150–450)
RBC: 3.49 x10E6/uL — ABNORMAL LOW (ref 3.77–5.28)
RDW: 11.9 % (ref 11.7–15.4)
WBC: 9.8 10*3/uL (ref 3.4–10.8)

## 2022-03-29 LAB — HIV ANTIBODY (ROUTINE TESTING W REFLEX): HIV Screen 4th Generation wRfx: NONREACTIVE

## 2022-03-29 LAB — GLUCOSE TOLERANCE, 1 HOUR: Glucose, 1Hr PP: 84 mg/dL (ref 70–199)

## 2022-03-29 LAB — RPR: RPR Ser Ql: NONREACTIVE

## 2022-04-03 DIAGNOSIS — F321 Major depressive disorder, single episode, moderate: Secondary | ICD-10-CM | POA: Diagnosis not present

## 2022-04-04 NOTE — Progress Notes (Unsigned)
Alert received through NCNotify system as follows:    Alert Note: Patient identified as not having a recommended prenatal visit. Patient is 27 weeks and 3 days pregnant. Most recent prenatal visit was on 12/28/2021 at [place].  Chart review: Patient's last visit was on 03/28/22.  No action needed.  Bethanne Ginger, [place] 04/04/2022  2:15 PM

## 2022-04-06 DIAGNOSIS — F321 Major depressive disorder, single episode, moderate: Secondary | ICD-10-CM | POA: Diagnosis not present

## 2022-04-10 ENCOUNTER — Ambulatory Visit (HOSPITAL_COMMUNITY)
Admission: EM | Admit: 2022-04-10 | Discharge: 2022-04-10 | Disposition: A | Discharge status: Home / Self Care | Payer: Commercial Managed Care - HMO | Attending: Internal Medicine | Admitting: Internal Medicine

## 2022-04-10 ENCOUNTER — Encounter (HOSPITAL_COMMUNITY): Payer: Self-pay

## 2022-04-10 DIAGNOSIS — Z1152 Encounter for screening for COVID-19: Secondary | ICD-10-CM | POA: Insufficient documentation

## 2022-04-10 DIAGNOSIS — R519 Headache, unspecified: Secondary | ICD-10-CM | POA: Diagnosis not present

## 2022-04-10 NOTE — ED Triage Notes (Signed)
Chief Complaint: Patient has headache. No other URI symptoms. Patient is pregnant and due April 19th.   Onset: this morning   Prescriptions or OTC medications tried: No    Sick exposure: Yes- cousin has covid   New foods, medications, or products: No  Recent Travel: No

## 2022-04-10 NOTE — Discharge Instructions (Signed)
Take tylenol as needed for headache.  Your covid test will come back in the next 12 to 24 hours, I will call you if it is positive.  Refer to the list of medications that are safe in pregnancy provided today urgent care for over-the-counter medicines you can take while you are sick.  If you develop any new or worsening symptoms or do not improve in the next 2 to 3 days, please return.  If your symptoms are severe, please go to the emergency room.  Follow-up with your primary care provider for further evaluation and management of your symptoms as well as ongoing wellness visits.  I hope you feel better!

## 2022-04-11 DIAGNOSIS — F321 Major depressive disorder, single episode, moderate: Secondary | ICD-10-CM | POA: Diagnosis not present

## 2022-04-11 LAB — SARS CORONAVIRUS 2 (TAT 6-24 HRS): SARS Coronavirus 2: NEGATIVE

## 2022-04-12 NOTE — ED Provider Notes (Signed)
[facility]    CSN: [id number] Arrival date & time: 04/10/22  1824      History   Chief Complaint Chief Complaint  Patient presents with   Covid Exposure   Headache    HPI Day'Jai Fravel is a 27 y.o. female.   Patient who is [redacted] weeks pregnant presents to urgent care for evaluation of generalized headache that started this morning.  Patient states she and her daughter were recently exposed to their friend who just tested positive for COVID-19.  She would like COVID-19 testing.  She is not currently experiencing any cough, fever/chills, sore throat, body aches, shortness of breath, chest pain, heart palpitations, wheezing, ear pain, rhinorrhea, nasal congestion, nausea, vomiting, abdominal pain, diarrhea, or dizziness.  Reports normal fetal kick counts and normal fetal movement.  No vaginal bleeding or vaginal symptoms reported.  No urinary symptoms reported.  Denies smoking/drug use during and prior to pregnancy.  No history of chronic respiratory problems reported.  Denies dizziness, vision changes, and recent falls/head trauma.  She has not taken any medications over-the-counter for headache.   Headache   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   Patient will need to be screened every 3 months 02/2015 nonreactive; exposed in Summer 2016, former partner incarcerated March 2017 [x]$  NR July 2017 [x]$  NR   Sept 2017 [x]$  NR   Infection    UTI   Kidney stone    Trichomonal infection    Urinary tract infection 02/23/2015    Patient Active Problem List   Diagnosis Date Noted   Encounter for supervision of low-risk pregnancy 11/28/2021   Hyperprolactinemia ([place]) 07/20/2020    Past Surgical History:  Procedure Laterality Date   INDUCED ABORTION      OB History     Gravida  4   Para  1   Term  1   Preterm  0   AB  2   Living  1      SAB  0   IAB  2   Ectopic  0   Multiple  0   Live  Births  1        Obstetric Comments  G2: mid December 2017 in office D&C (elective AB)          Home Medications    Prior to Admission medications   Medication Sig Start Date End Date Taking? Authorizing Provider  Prenatal Vit-Fe Fumarate-FA (PRENATAL VITAMIN) 27-0.8 MG TABS Take 1 tablet by mouth daily. 10/24/21  Yes Barrett Henle, MD  terconazole (TERAZOL 7) 0.4 % vaginal cream Place 1 applicator vaginally at bedtime. Use for seven days Patient not taking: Reported on 03/28/2022 12/12/21   Osborne Oman, MD    Family History Family History  Problem Relation Age of Onset   Healthy Mother    Healthy Father    Cancer Neg Hx    Diabetes Neg Hx    Heart disease Neg Hx    Hypertension Neg Hx    Stroke Neg Hx    Hearing loss Neg Hx    Asthma Neg Hx    Other Neg Hx        elev prolactin    Social History Social History   Tobacco Use   Smoking status: Never   Smokeless tobacco: Never  Vaping Use   Vaping Use: Never used  Substance Use Topics   Alcohol use: Not Currently  Drug use: No     Allergies   Shellfish allergy   Review of Systems Review of Systems  Neurological:  Positive for headaches.  Per HPI   Physical Exam Triage Vital Signs ED Triage Vitals  Enc Vitals Group     BP 04/10/22 1956 110/68     Pulse Rate 04/10/22 1956 90     Resp 04/10/22 1956 16     Temp 04/10/22 1956 98.2 F (36.8 C)     Temp Source 04/10/22 1956 Oral     SpO2 04/10/22 1956 96 %     Weight 04/10/22 1956 180 lb 12.4 oz (82 kg)     Height 04/10/22 1956 5' 7"$  (1.702 m)     Head Circumference --      Peak Flow --      Pain Score 04/10/22 1955 5     Pain Loc --      Pain Edu? --      Excl. in [place]? --    No data found.  Updated Vital Signs BP 110/68 (BP Location: Right Arm)   Pulse 90   Temp 98.2 F (36.8 C) (Oral)   Resp 16   Ht 5' 7"$  (1.702 m)   Wt 180 lb 12.4 oz (82 kg)   LMP 09/17/2021   SpO2 96%   BMI 28.31 kg/m   Visual Acuity Right Eye  Distance:   Left Eye Distance:   Bilateral Distance:    Right Eye Near:   Left Eye Near:    Bilateral Near:     Physical Exam Vitals and nursing note reviewed.  Constitutional:      Appearance: She is not ill-appearing or toxic-appearing.  HENT:     Head: Normocephalic and atraumatic.     Right Ear: Hearing, tympanic membrane, ear canal and external ear normal.     Left Ear: Hearing, tympanic membrane, ear canal and external ear normal.     Nose: Nose normal.     Mouth/Throat:     Lips: Pink.     Mouth: Mucous membranes are moist.     Pharynx: Oropharynx is clear.  Eyes:     General: Lids are normal. Vision grossly intact. Gaze aligned appropriately.     Extraocular Movements: Extraocular movements intact.     Conjunctiva/sclera: Conjunctivae normal.  Cardiovascular:     Rate and Rhythm: Normal rate and regular rhythm.     Heart sounds: Normal heart sounds, S1 normal and S2 normal.  Pulmonary:     Effort: Pulmonary effort is normal. No respiratory distress.     Breath sounds: Normal breath sounds and air entry.  Abdominal:     Comments: Pregnant.   Musculoskeletal:     Cervical back: Neck supple.  Skin:    General: Skin is warm and dry.     Capillary Refill: Capillary refill takes less than 2 seconds.     Findings: No rash.  Neurological:     General: No focal deficit present.     Mental Status: She is alert and oriented to person, place, and time. Mental status is at baseline.     Cranial Nerves: Cranial nerves 2-12 are intact. No dysarthria or facial asymmetry.     Sensory: Sensation is intact.     Motor: Motor function is intact.     Coordination: Coordination is intact.     Gait: Gait is intact.  Psychiatric:        Mood and Affect: Mood normal.  Speech: Speech normal.        Behavior: Behavior normal.        Thought Content: Thought content normal.        Judgment: Judgment normal.      UC Treatments / Results  Labs (all labs ordered are listed,  but only abnormal results are displayed) Labs Reviewed  SARS CORONAVIRUS 2 (TAT 6-24 HRS)    EKG   Radiology No results found.  Procedures Procedures (including critical care time)  Medications Ordered in UC Medications - No data to display  Initial Impression / Assessment and Plan / UC Course  I have reviewed the triage vital signs and the nursing notes.  Pertinent labs & imaging results that were available during my care of the patient were reviewed by me and considered in my medical decision making (see chart for details).   1. Bad headache, encounter for screening for COVID-19 Neurologic exam is stable.  Patient may take Tylenol 1000 mg every 6 hours as needed for headache.  COVID-19 testing is pending and will come back in the next 12 to 24 hours, staff will call her if it is positive.  Quarantine guidelines discussed.  Patient provided with an updated list of medications over-the-counter that are safe in pregnancy should she develop any new or worsening symptoms related to possible COVID-19/viral URI.  Work note given.  Discussed physical exam and available lab work findings in clinic with patient.  Counseled patient regarding appropriate use of medications and potential side effects for all medications recommended or prescribed today. Discussed red flag signs and symptoms of worsening condition,when to call the PCP office, return to urgent care, and when to seek higher level of care in the emergency department. Patient verbalizes understanding and agreement with plan. All questions answered. Patient discharged in stable condition.    Final Clinical Impressions(s) / UC Diagnoses   Final diagnoses:  Bad headache  Encounter for screening for COVID-19     Discharge Instructions      Take tylenol as needed for headache.  Your covid test will come back in the next 12 to 24 hours, I will call you if it is positive.  Refer to the list of medications that are safe in  pregnancy provided today urgent care for over-the-counter medicines you can take while you are sick.  If you develop any new or worsening symptoms or do not improve in the next 2 to 3 days, please return.  If your symptoms are severe, please go to the emergency room.  Follow-up with your primary care provider for further evaluation and management of your symptoms as well as ongoing wellness visits.  I hope you feel better!   ED Prescriptions   None    PDMP not reviewed this encounter.   Talbot Grumbling,  04/12/22 1946

## 2022-04-13 DIAGNOSIS — F321 Major depressive disorder, single episode, moderate: Secondary | ICD-10-CM | POA: Diagnosis not present

## 2022-04-15 ENCOUNTER — Other Ambulatory Visit: Payer: Self-pay

## 2022-04-15 ENCOUNTER — Encounter: Payer: Self-pay | Admitting: Obstetrics and Gynecology

## 2022-04-15 ENCOUNTER — Ambulatory Visit (INDEPENDENT_AMBULATORY_CARE_PROVIDER_SITE_OTHER): Payer: Commercial Managed Care - HMO | Admitting: Obstetrics and Gynecology

## 2022-04-15 VITALS — BP 110/67 | HR 113 | Wt 183.9 lb

## 2022-04-15 DIAGNOSIS — Z3A30 30 weeks gestation of pregnancy: Secondary | ICD-10-CM

## 2022-04-15 DIAGNOSIS — Z3493 Encounter for supervision of normal pregnancy, unspecified, third trimester: Secondary | ICD-10-CM

## 2022-04-15 NOTE — Progress Notes (Signed)
Subjective:  Madeline Wilkins is a 27 y.o. G4P1021 at 31w0dbeing seen today for ongoing prenatal care.  She is currently monitored for the following issues for this high-risk pregnancy and has Hyperprolactinemia ([place] and Encounter for supervision of low-risk pregnancy on their problem list.  Patient reports  general discomforts of pregnancy .  Contractions: Irritability. Vag. Bleeding: None.  Movement: Present. Denies leaking of fluid.   The following portions of the patient's history were reviewed and updated as appropriate: allergies, current medications, past family history, past medical history, past social history, past surgical history and problem list. Problem list updated.  Objective:   Vitals:   04/15/22 1029  BP: 110/67  Pulse: (!) 113  Weight: 183 lb 14.4 oz (83.4 kg)    Fetal Status: Fetal Heart Rate (bpm): 142 Fundal Height: 30 cm Movement: Present     General:  Alert, oriented and cooperative. Patient is in no acute distress.  Skin: Skin is warm and dry. No rash noted.   Cardiovascular: Normal heart rate noted  Respiratory: Normal respiratory effort, no problems with respiration noted  Abdomen: Soft, gravid, appropriate for gestational age. Pain/Pressure: Present     Pelvic:  Cervical exam performed        Extremities: Normal range of motion.  Edema: Mild pitting, slight indentation  Mental Status: Normal mood and affect. Normal behavior. Normal judgment and thought content.   Urinalysis:      Assessment and Plan:  Pregnancy: G4P1021 at 327w0d1. Encounter for supervision of low-risk pregnancy in third trimester Stable  Preterm labor symptoms and general obstetric precautions including but not limited to vaginal bleeding, contractions, leaking of fluid and fetal movement were reviewed in detail with the patient. Please refer to After Visit Summary for other counseling recommendations.  Return in about 2 weeks (around 04/29/2022) for face to face, any  provider.   ErChancy MilroyMD

## 2022-04-20 DIAGNOSIS — F321 Major depressive disorder, single episode, moderate: Secondary | ICD-10-CM | POA: Diagnosis not present

## 2022-04-22 ENCOUNTER — Telehealth: Payer: Self-pay | Admitting: Family Medicine

## 2022-04-22 NOTE — Telephone Encounter (Signed)
Patient calling in regards to a note for Bed Rest.

## 2022-04-22 NOTE — Telephone Encounter (Signed)
Called patient, no answer- left message stating I am trying to reach her to return her phone call. Please call us back if you still need assistance.

## 2022-04-24 IMAGING — RF DG HYSTEROGRAM
1 series · 5 of 5 positions shown · IV contrast (omnipaque)
Comparison: None.

CLINICAL DATA: Encounter for infertility

EXAM:
HYSTEROSALPINGOGRAM
TECHNIQUE: Following cleansing of the cervix and vagina with Betadine solution,
a hysterosalpingogram was performed using a 5-French
hysterosalpingogram catheter and Omnipaque 300 contrast. The patient
tolerated the examination without difficulty.

[Series 1: one shot · 0.14mm/px · 5 of 5 slices shown]
[im 1/5]
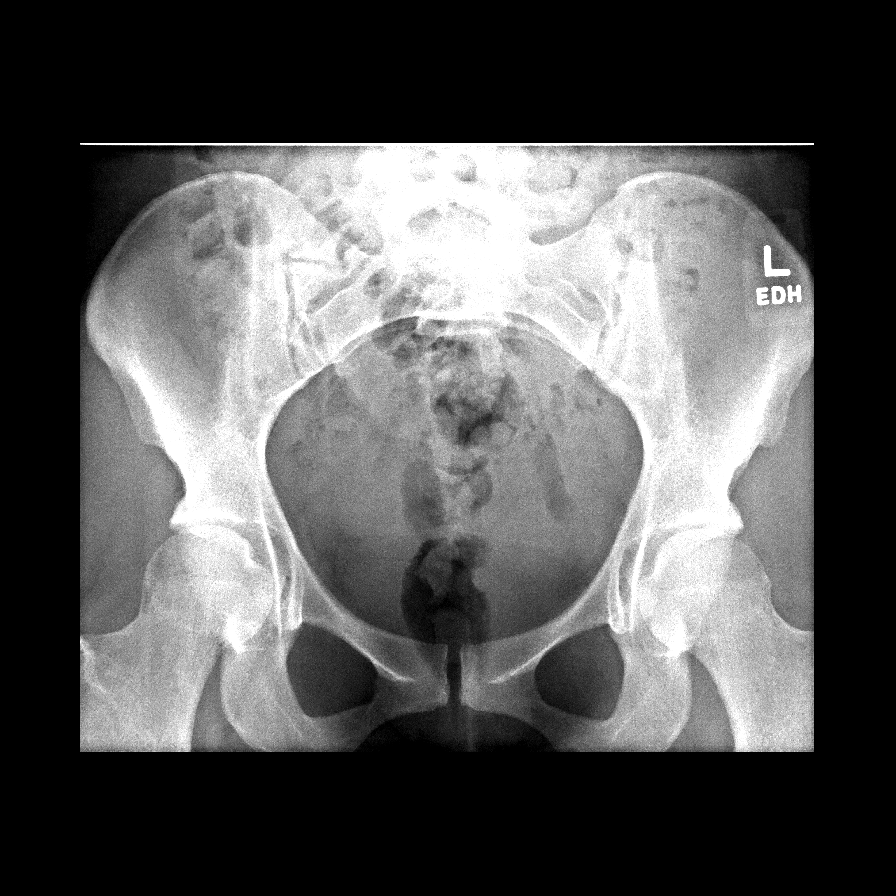
[im 2/5]
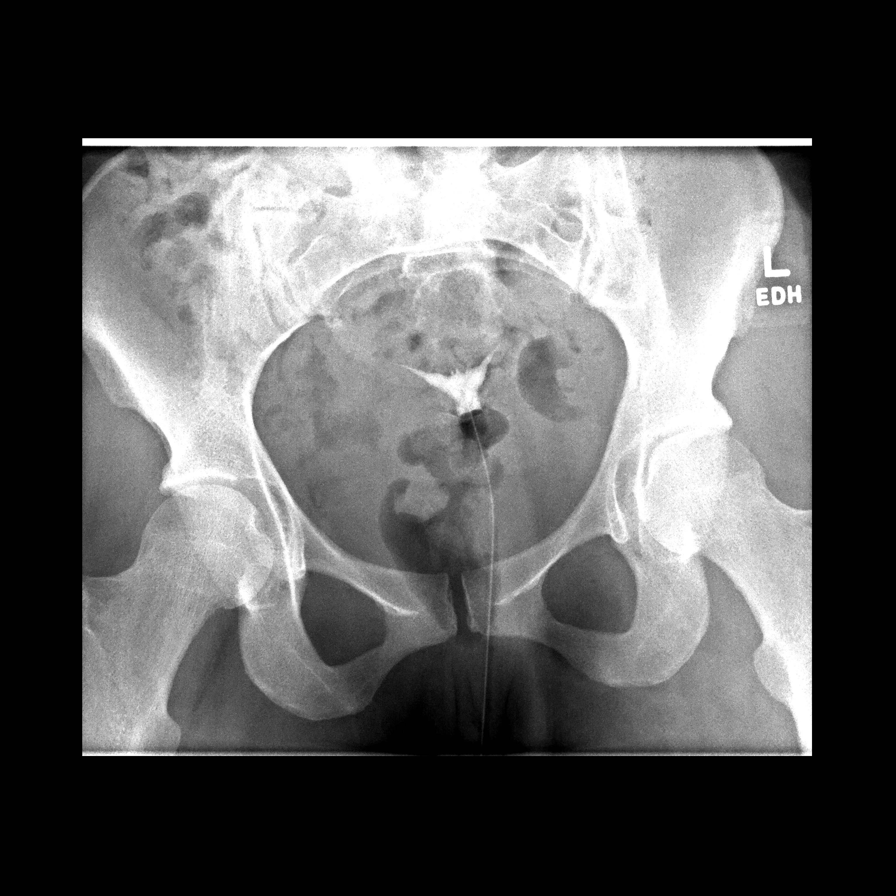
[im 3/5]
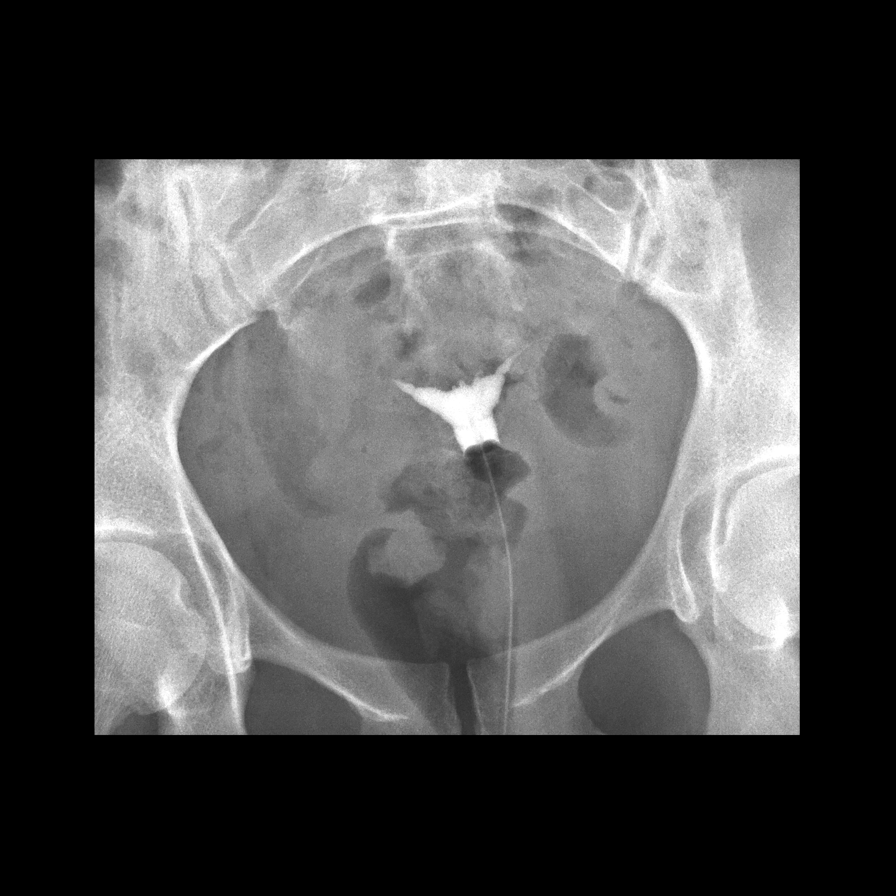
[im 4/5]
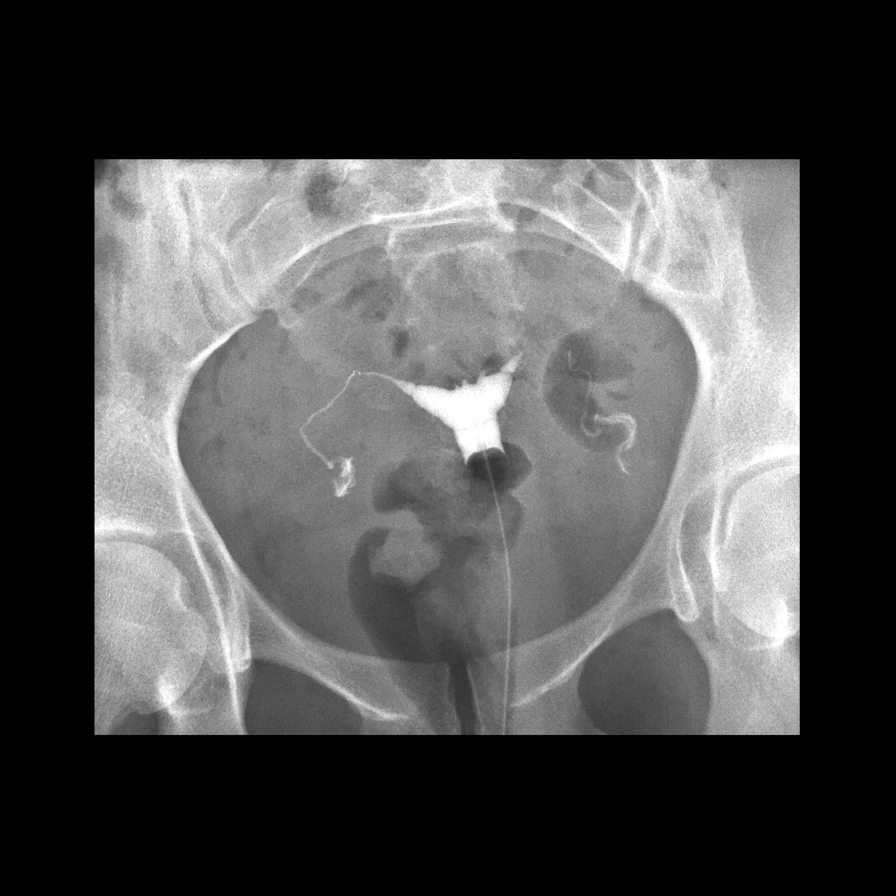
[im 5/5]
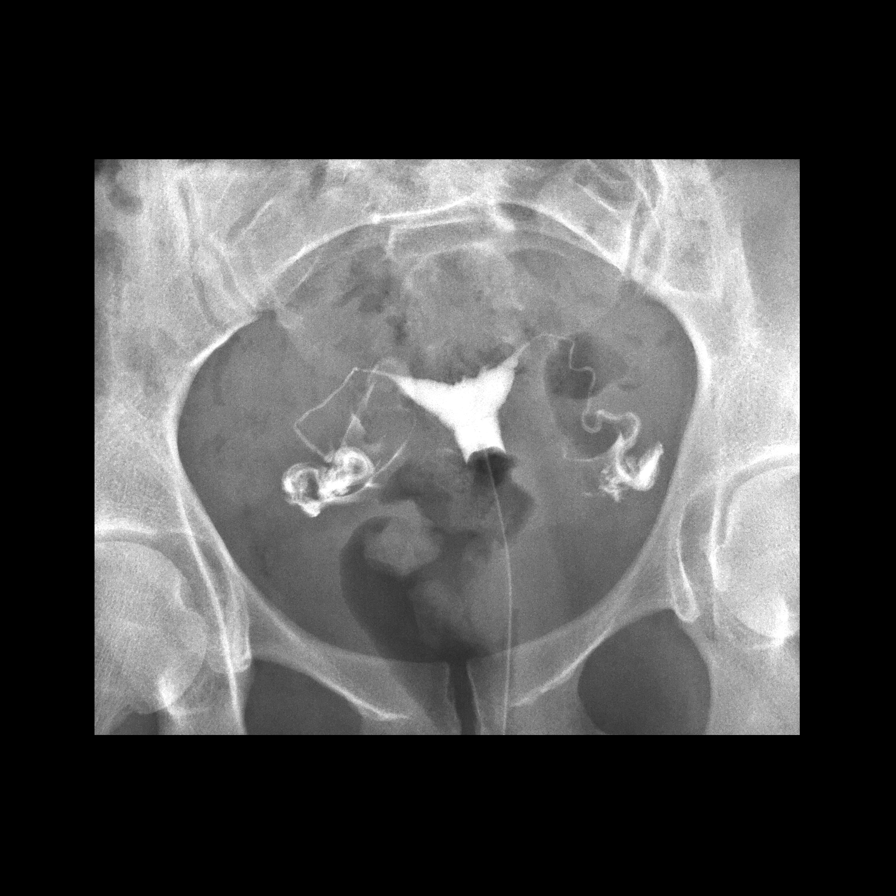

[5 of 5 positions shown; findings below may reference images not displayed]

FLUOROSCOPY TIME:  Radiation Exposure Index (as provided by the
fluoroscopic device): 29 mGy

If the device does not provide the exposure index:

Fluoroscopy Time:  48 seconds

Number of Acquired Images:  0
FINDINGS: Endometrial cavity is normal. Both fallopian tubes fill and have a
normal appearance. Normal spillage bilaterally.
IMPRESSION: Normal study.

## 2022-04-24 NOTE — Progress Notes (Signed)
   PRENATAL VISIT NOTE  Subjective:  Madeline Wilkins is a 27 y.o. G4P1021 at 79w0dbeing seen today for ongoing prenatal care.  She is currently monitored for the following issues for this low-risk pregnancy and has Hyperprolactinemia ([place] and Encounter for supervision of low-risk pregnancy on their problem list.  Patient reports no complaints.  Contractions: Irregular. Vag. Bleeding: None.  Movement: Present. Denies leaking of fluid.   The following portions of the patient's history were reviewed and updated as appropriate: allergies, current medications, past family history, past medical history, past social history, past surgical history and problem list.   Objective:   Vitals:   04/29/22 1124  BP: 119/73  Pulse: 98  Weight: 187 lb (84.8 kg)    Fetal Status: Fetal Heart Rate (bpm): 140   Movement: Present     General:  Alert, oriented and cooperative. Patient is in no acute distress.  Skin: Skin is warm and dry. No rash noted.   Cardiovascular: Normal heart rate noted  Respiratory: Normal respiratory effort, no problems with respiration noted  Abdomen: Soft, gravid, appropriate for gestational age.  Pain/Pressure: Present     Pelvic: Cervical exam deferred        Extremities: Normal range of motion.  Edema: Trace  Mental Status: Normal mood and affect. Normal behavior. Normal judgment and thought content.   Assessment and Plan:  Pregnancy: G4P1021 at 358w0d. Encounter for supervision of low-risk pregnancy in third trimester Normal 1 hr and other 28w labs.  Low risk pregnancy overall.   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 She would like to be off work starting mid-March. She does not qualify for FMLA and she  understands this does not meet the requirement for Short Term Disability to start so it would be unpaid.    Preterm labor symptoms and general obstetric precautions including but not limited to vaginal bleeding, contractions, leaking of fluid and fetal movement were reviewed in detail with the patient. Please refer to After Visit Summary for other counseling recommendations.   Return in about 2 weeks (around 05/13/2022) for OB VISIT, MD or APP.  Future Appointments  Date Time Provider [place]3/01/2023 11:15 AM DuRadene GunningMD [facility]3/25/2024 11:15 AM PiAletha HalimMD [facility]  PaRadene GunningMD

## 2022-04-25 ENCOUNTER — Telehealth: Payer: Self-pay | Admitting: Family Medicine

## 2022-04-25 NOTE — Telephone Encounter (Signed)
Called patient stating I am returning her phone call. 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 Discussed with patient only our providers like doctors or midwives can approve her coming out of work normally. Advised she will have to speak with the doctor on Monday regarding that. Patient verbalized understanding.

## 2022-04-25 NOTE — Telephone Encounter (Signed)
Patient called in wanting to discuss going on bed rest next month.

## 2022-04-29 ENCOUNTER — Other Ambulatory Visit: Payer: Self-pay

## 2022-04-29 ENCOUNTER — Ambulatory Visit (INDEPENDENT_AMBULATORY_CARE_PROVIDER_SITE_OTHER): Payer: Commercial Managed Care - HMO | Admitting: Obstetrics and Gynecology

## 2022-04-29 ENCOUNTER — Encounter: Payer: Self-pay | Admitting: Obstetrics and Gynecology

## 2022-04-29 VITALS — BP 119/73 | HR 98 | Wt 187.0 lb

## 2022-04-29 DIAGNOSIS — Z3A32 32 weeks gestation of pregnancy: Secondary | ICD-10-CM

## 2022-04-29 DIAGNOSIS — Z3493 Encounter for supervision of normal pregnancy, unspecified, third trimester: Secondary | ICD-10-CM

## 2022-05-09 DIAGNOSIS — F321 Major depressive disorder, single episode, moderate: Secondary | ICD-10-CM | POA: Diagnosis not present

## 2022-05-11 DIAGNOSIS — F321 Major depressive disorder, single episode, moderate: Secondary | ICD-10-CM | POA: Diagnosis not present

## 2022-05-13 ENCOUNTER — Ambulatory Visit (INDEPENDENT_AMBULATORY_CARE_PROVIDER_SITE_OTHER): Payer: Commercial Managed Care - HMO | Admitting: Family Medicine

## 2022-05-13 ENCOUNTER — Other Ambulatory Visit: Payer: Self-pay

## 2022-05-13 VITALS — BP 102/63 | HR 101 | Wt 189.0 lb

## 2022-05-13 DIAGNOSIS — Z3493 Encounter for supervision of normal pregnancy, unspecified, third trimester: Secondary | ICD-10-CM

## 2022-05-13 DIAGNOSIS — Z3A34 34 weeks gestation of pregnancy: Secondary | ICD-10-CM

## 2022-05-13 NOTE — Progress Notes (Signed)
   PRENATAL VISIT NOTE  Subjective:  Madeline Wilkins is a 27 y.o. G4P1021 at [redacted]w[redacted]d being seen today for ongoing prenatal care.  She is currently monitored for the following issues for this low-risk pregnancy and has Hyperprolactinemia ([place]) and Encounter for supervision of low-risk pregnancy on their problem list.  Patient reports no complaints.  Contractions: Irregular.  .  Movement: Present. Denies leaking of fluid.   The following portions of the patient's history were reviewed and updated as appropriate: allergies, current medications, past family history, past medical history, past social history, past surgical history and problem list.   Objective:   Vitals:   05/13/22 1145  BP: 102/63  Pulse: (!) 101  Weight: 189 lb (85.7 kg)    Fetal Status: Fetal Heart Rate (bpm): 142 Fundal Height: 36 cm Movement: Present     General:  Alert, oriented and cooperative. Patient is in no acute distress.  Skin: Skin is warm and dry. No rash noted.   Cardiovascular: Normal heart rate noted  Respiratory: Normal respiratory effort, no problems with respiration noted  Abdomen: Soft, gravid, appropriate for gestational age.  Pain/Pressure: Present     Pelvic: Cervical exam deferred        Extremities: Normal range of motion.     Mental Status: Normal mood and affect. Normal behavior. Normal judgment and thought content.   Assessment and Plan:  Pregnancy: G4P1021 at [redacted]w[redacted]d 1. Encounter for supervision of low-risk pregnancy in third trimester No acute concerns. 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 [redacted] weeks gestation of pregnancy Follow up in 2 weeks. Discussed nature of 36 week visit.    Preterm labor symptoms and general obstetric precautions including but not limited to vaginal bleeding, contractions, leaking of fluid and fetal movement were reviewed in detail with the patient. Please refer to After Visit Summary for other counseling  recommendations.   No follow-ups on file.  Future Appointments  Date Time Provider [place]  05/27/2022 11:15 AM Aletha Halim, MD [facility]    Simone Autry-Lott, DO

## 2022-05-27 ENCOUNTER — Encounter: Payer: Commercial Managed Care - HMO | Admitting: Obstetrics and Gynecology

## 2022-05-29 ENCOUNTER — Other Ambulatory Visit: Payer: Self-pay

## 2022-05-29 ENCOUNTER — Other Ambulatory Visit (HOSPITAL_COMMUNITY)
Admission: RE | Admit: 2022-05-29 | Discharge: 2022-05-29 | Disposition: A | Discharge status: Home / Self Care | Payer: Commercial Managed Care - HMO | Source: Ambulatory Visit | Attending: Obstetrics and Gynecology | Admitting: Obstetrics and Gynecology

## 2022-05-29 ENCOUNTER — Encounter: Payer: Commercial Managed Care - HMO | Admitting: Obstetrics & Gynecology

## 2022-05-29 ENCOUNTER — Ambulatory Visit (INDEPENDENT_AMBULATORY_CARE_PROVIDER_SITE_OTHER): Payer: Commercial Managed Care - HMO | Admitting: Obstetrics & Gynecology

## 2022-05-29 ENCOUNTER — Encounter: Payer: Self-pay | Admitting: Obstetrics & Gynecology

## 2022-05-29 VITALS — BP 120/70 | HR 108 | Wt 190.3 lb

## 2022-05-29 DIAGNOSIS — Z3A36 36 weeks gestation of pregnancy: Secondary | ICD-10-CM

## 2022-05-29 DIAGNOSIS — Z3483 Encounter for supervision of other normal pregnancy, third trimester: Secondary | ICD-10-CM | POA: Insufficient documentation

## 2022-05-29 DIAGNOSIS — Z3493 Encounter for supervision of normal pregnancy, unspecified, third trimester: Secondary | ICD-10-CM

## 2022-05-29 NOTE — Progress Notes (Signed)
   PRENATAL VISIT NOTE  Subjective:  Madeline Wilkins is a 27 y.o. G4P1021 at [redacted]w[redacted]d being seen today for ongoing prenatal care.  She is currently monitored for the following issues for this low-risk pregnancy and has Hyperprolactinemia ([place]) and Encounter for supervision of low-risk pregnancy in third trimester on their problem list.  Patient reports occasional contractions.  Contractions: Irregular. Vag. Bleeding: None.  Movement: Present. Denies leaking of fluid.   The following portions of the patient's history were reviewed and updated as appropriate: allergies, current medications, past family history, past medical history, past social history, past surgical history and problem list.   Objective:   Vitals:   05/29/22 1629  BP: 120/70  Pulse: (!) 108  Weight: 190 lb 4.8 oz (86.3 kg)    Fetal Status: Fetal Heart Rate (bpm): 150 Fundal Height: 36 cm Movement: Present  Presentation: Vertex  General:  Alert, oriented and cooperative. Patient is in no acute distress.  Skin: Skin is warm and dry. No rash noted.   Cardiovascular: Normal heart rate noted  Respiratory: Normal respiratory effort, no problems with respiration noted  Abdomen: Soft, gravid, appropriate for gestational age.  Pain/Pressure: Present     Pelvic: Cervical exam performed in the presence of a chaperone Dilation: 1 Effacement (%): Thick Station: Ballotable.Cultures done.  Extremities: Normal range of motion.  Edema: None  Mental Status: Normal mood and affect. Normal behavior. Normal judgment and thought content.   Assessment and Plan:  Pregnancy: G4P1021 at [redacted]w[redacted]d 1. [redacted] weeks gestation of pregnancy 2. Encounter for supervision of low-risk pregnancy in third trimester Pelvic cultures done today, will follow up results and manage accordingly. - Cervicovaginal ancillary only( [place]) - Culture, beta strep (group b only) Preterm labor symptoms and general obstetric precautions including but not limited to vaginal  bleeding, contractions, leaking of fluid and fetal movement were reviewed in detail with the patient. Please refer to After Visit Summary for other counseling recommendations.   Return in about 1 week (around 06/05/2022) for OFFICE OB VISIT (MD or APP).  Future Appointments  Date Time Provider [place]  06/05/2022  4:05 PM Darliss Cheney, MD [facility]    Verita Schneiders, MD

## 2022-05-29 NOTE — Patient Instructions (Signed)
Return to office for any scheduled appointments. Call the office or go to the MAU at [facility] if: You begin to have strong, frequent contractions Your water breaks.  Sometimes it is a big gush of fluid, sometimes it is just a trickle that keeps getting your underwear wet or running down your legs You have vaginal bleeding.  It is normal to have a small amount of spotting if your cervix was checked.  You do not feel your baby moving like normal.  If you do not, get something to eat and drink and lay down and focus on feeling your baby move.   If your baby is still not moving like normal, you should call the office or go to MAU. Any other obstetric concerns.  

## 2022-05-30 LAB — CERVICOVAGINAL ANCILLARY ONLY
Chlamydia: NEGATIVE
Comment: NEGATIVE
Comment: NORMAL
Neisseria Gonorrhea: NEGATIVE

## 2022-06-02 LAB — CULTURE, BETA STREP (GROUP B ONLY): Strep Gp B Culture: NEGATIVE

## 2022-06-04 ENCOUNTER — Encounter: Payer: Self-pay | Admitting: Obstetrics and Gynecology

## 2022-06-05 ENCOUNTER — Encounter: Payer: Commercial Managed Care - HMO | Admitting: Obstetrics and Gynecology

## 2022-06-05 ENCOUNTER — Telehealth: Payer: Self-pay | Admitting: Family Medicine

## 2022-06-05 NOTE — Telephone Encounter (Signed)
Patient would like a call back from a nurse regarding question she has.

## 2022-06-05 NOTE — Telephone Encounter (Signed)
Addressed concerns via MyChart message.

## 2022-06-06 DIAGNOSIS — F321 Major depressive disorder, single episode, moderate: Secondary | ICD-10-CM | POA: Diagnosis not present

## 2022-06-07 ENCOUNTER — Encounter: Payer: Self-pay | Admitting: Advanced Practice Midwife

## 2022-06-07 ENCOUNTER — Other Ambulatory Visit: Payer: Self-pay

## 2022-06-07 ENCOUNTER — Ambulatory Visit (INDEPENDENT_AMBULATORY_CARE_PROVIDER_SITE_OTHER): Payer: Commercial Managed Care - HMO | Admitting: Advanced Practice Midwife

## 2022-06-07 VITALS — BP 139/75 | HR 118 | Wt 191.7 lb

## 2022-06-07 DIAGNOSIS — Z3493 Encounter for supervision of normal pregnancy, unspecified, third trimester: Secondary | ICD-10-CM

## 2022-06-07 DIAGNOSIS — F321 Major depressive disorder, single episode, moderate: Secondary | ICD-10-CM | POA: Diagnosis not present

## 2022-06-07 DIAGNOSIS — E221 Hyperprolactinemia: Secondary | ICD-10-CM

## 2022-06-07 DIAGNOSIS — Z3A37 37 weeks gestation of pregnancy: Secondary | ICD-10-CM

## 2022-06-07 NOTE — Progress Notes (Signed)
   PRENATAL VISIT NOTE  Subjective:  Madeline Wilkins is a 27 y.o. [id number] at [redacted]w[redacted]d being seen today for ongoing prenatal care.  She is currently monitored for the following issues for this low-risk pregnancy and has Hyperprolactinemia and Encounter for supervision of low-risk pregnancy in third trimester on their problem list.  Patient reports occasional contractions.  Contractions: Irritability. Vag. Bleeding: None.  Movement: Present. Denies leaking of fluid.   The following portions of the patient's history were reviewed and updated as appropriate: allergies, current medications, past family history, past medical history, past social history, past surgical history and problem list.   Objective:   Vitals:   06/07/22 1120  BP: 139/75  Pulse: (!) 118  Weight: 191 lb 11.2 oz (87 kg)    Fetal Status: Fetal Heart Rate (bpm): 140 Fundal Height: 37 cm Movement: Present     General:  Alert, oriented and cooperative. Patient is in no acute distress.  Skin: Skin is warm and dry. No rash noted.   Cardiovascular: Normal heart rate noted  Respiratory: Normal respiratory effort, no problems with respiration noted  Abdomen: Soft, gravid, appropriate for gestational age.  Pain/Pressure: Present     Pelvic: Cervical exam performed in the presence of a chaperone Dilation: 1 Effacement (%): 50 Station: -3  Extremities: Normal range of motion.  Edema: Trace  Mental Status: Normal mood and affect. Normal behavior. Normal judgment and thought content.   Assessment and Plan:  Pregnancy: G4P1021 at [redacted]w[redacted]d 1. Encounter for supervision of low-risk pregnancy in third trimester --Anticipatory guidance about next visits/weeks of pregnancy given.  --Reviewed labor readiness with patient including the [facility], evening primrose oil, and raspberry leaf tea.    2. [redacted] weeks gestation of pregnancy   3. Hyperprolactinemia --F/U PP  Term labor symptoms and general obstetric precautions including but not  limited to vaginal bleeding, contractions, leaking of fluid and fetal movement were reviewed in detail with the patient. Please refer to After Visit Summary for other counseling recommendations.   Return in about 1 week (around 06/14/2022).  Future Appointments  Date Time Provider Department Center  06/19/2022  3:55 PM Warden Fillers, MD [facility]    Sharen Counter, CNM

## 2022-06-11 DIAGNOSIS — F321 Major depressive disorder, single episode, moderate: Secondary | ICD-10-CM | POA: Diagnosis not present

## 2022-06-12 DIAGNOSIS — F321 Major depressive disorder, single episode, moderate: Secondary | ICD-10-CM | POA: Diagnosis not present

## 2022-06-19 ENCOUNTER — Other Ambulatory Visit: Payer: Self-pay

## 2022-06-19 ENCOUNTER — Ambulatory Visit (INDEPENDENT_AMBULATORY_CARE_PROVIDER_SITE_OTHER): Payer: Medicaid Other | Admitting: Obstetrics and Gynecology

## 2022-06-19 VITALS — BP 115/81 | HR 105 | Wt 192.5 lb

## 2022-06-19 DIAGNOSIS — F321 Major depressive disorder, single episode, moderate: Secondary | ICD-10-CM | POA: Diagnosis not present

## 2022-06-19 DIAGNOSIS — Z3493 Encounter for supervision of normal pregnancy, unspecified, third trimester: Secondary | ICD-10-CM

## 2022-06-19 DIAGNOSIS — Z3A39 39 weeks gestation of pregnancy: Secondary | ICD-10-CM

## 2022-06-19 DIAGNOSIS — Z3490 Encounter for supervision of normal pregnancy, unspecified, unspecified trimester: Secondary | ICD-10-CM

## 2022-06-19 NOTE — Progress Notes (Signed)
Always having cramps in left leg

## 2022-06-19 NOTE — Progress Notes (Signed)
   PRENATAL VISIT NOTE  Subjective:  Madeline Wilkins is a 27 y.o. G4P1021 at [redacted]w[redacted]d being seen today for ongoing prenatal care.  She is currently monitored for the following issues for this low-risk pregnancy and has Hyperprolactinemia and Encounter for supervision of low-risk pregnancy in third trimester on their problem list.  Patient doing well with no acute concerns today. She reports no complaints.  Contractions: Irritability. Vag. Bleeding: None.  Movement: Present.Denies leaking of fluid.   The following portions of the patient's history were reviewed and updated as appropriate: allergies, current medications, past family history, past medical history, past social history, past surgical history and problem list. Problem list updated.  Objective:   Vitals:   06/19/22 1629  BP: 115/81  Pulse: (!) 105  Weight: 192 lb 8 oz (87.3 kg)    Fetal Status: Fetal Heart Rate (bpm): 147 Fundal Height: 40 cm Movement: Present     General:  Alert, oriented and cooperative. Patient is in no acute distress.  Skin: Skin is warm and dry. No rash noted.   Cardiovascular: Normal heart rate noted  Respiratory: Normal respiratory effort, no problems with respiration noted  Abdomen: Soft, gravid, appropriate for gestational age.  Pain/Pressure: Present     Pelvic: Cervical exam performed Dilation: 2.5 Effacement (%): 70 Station: -3  Extremities: Normal range of motion.  Edema: None  Mental Status:  Normal mood and affect. Normal behavior. Normal judgment and thought content.   Assessment and Plan:  Pregnancy: G4P1021 at [redacted]w[redacted]d  1. [redacted] weeks gestation of pregnancy   2. Encounter for supervision of low-risk pregnancy in third trimester Pt scheduled for IOL at 41 weeks, NST next week  Term labor symptoms and general obstetric precautions including but not limited to vaginal bleeding, contractions, leaking of fluid and fetal movement were reviewed in detail with the patient.  Please refer to After  Visit Summary for other counseling recommendations.   Return in about 1 week (around 06/26/2022) for ROB, in person, with NST.   Mariel Aloe, MD [facility] Attending [facility]

## 2022-06-20 ENCOUNTER — Telehealth (HOSPITAL_COMMUNITY): Payer: Self-pay | Admitting: *Deleted

## 2022-06-20 ENCOUNTER — Encounter (HOSPITAL_COMMUNITY): Payer: Self-pay | Admitting: *Deleted

## 2022-06-20 DIAGNOSIS — F321 Major depressive disorder, single episode, moderate: Secondary | ICD-10-CM | POA: Diagnosis not present

## 2022-06-20 NOTE — Telephone Encounter (Signed)
Preadmission screen  

## 2022-06-23 ENCOUNTER — Inpatient Hospital Stay (HOSPITAL_COMMUNITY)
Admission: AD | Admit: 2022-06-23 | Discharge: 2022-06-25 | DRG: 806 | Disposition: A | Discharge status: Home / Self Care | Payer: Medicaid Other | Attending: Obstetrics and Gynecology | Admitting: Obstetrics and Gynecology

## 2022-06-23 ENCOUNTER — Other Ambulatory Visit: Payer: Self-pay

## 2022-06-23 ENCOUNTER — Ambulatory Visit (HOSPITAL_COMMUNITY): Payer: Medicaid Other

## 2022-06-23 ENCOUNTER — Encounter (HOSPITAL_COMMUNITY): Payer: Self-pay | Admitting: Obstetrics and Gynecology

## 2022-06-23 DIAGNOSIS — O480 Post-term pregnancy: Secondary | ICD-10-CM | POA: Diagnosis not present

## 2022-06-23 DIAGNOSIS — Z3A39 39 weeks gestation of pregnancy: Secondary | ICD-10-CM

## 2022-06-23 DIAGNOSIS — Z3A40 40 weeks gestation of pregnancy: Secondary | ICD-10-CM | POA: Diagnosis not present

## 2022-06-23 DIAGNOSIS — O99284 Endocrine, nutritional and metabolic diseases complicating childbirth: Secondary | ICD-10-CM | POA: Diagnosis not present

## 2022-06-23 DIAGNOSIS — Z3490 Encounter for supervision of normal pregnancy, unspecified, unspecified trimester: Secondary | ICD-10-CM

## 2022-06-23 DIAGNOSIS — Z8759 Personal history of other complications of pregnancy, childbirth and the puerperium: Secondary | ICD-10-CM

## 2022-06-23 DIAGNOSIS — Z3493 Encounter for supervision of normal pregnancy, unspecified, third trimester: Principal | ICD-10-CM

## 2022-06-23 DIAGNOSIS — Z87442 Personal history of urinary calculi: Secondary | ICD-10-CM

## 2022-06-23 DIAGNOSIS — O26893 Other specified pregnancy related conditions, third trimester: Secondary | ICD-10-CM | POA: Diagnosis not present

## 2022-06-23 DIAGNOSIS — E221 Hyperprolactinemia: Secondary | ICD-10-CM | POA: Diagnosis not present

## 2022-06-23 LAB — TYPE AND SCREEN
ABO/RH(D): A POS
Antibody Screen: NEGATIVE

## 2022-06-23 LAB — CBC
HCT: 32.9 % — ABNORMAL LOW (ref 36.0–46.0)
Hemoglobin: 11.2 g/dL — ABNORMAL LOW (ref 12.0–15.0)
MCH: 30.8 pg (ref 26.0–34.0)
MCHC: 34 g/dL (ref 30.0–36.0)
MCV: 90.4 fL (ref 80.0–100.0)
Platelets: 207 10*3/uL (ref 150–400)
RBC: 3.64 MIL/uL — ABNORMAL LOW (ref 3.87–5.11)
RDW: 12.4 % (ref 11.5–15.5)
WBC: 5.6 10*3/uL (ref 4.0–10.5)
nRBC: 0 % (ref 0.0–0.2)

## 2022-06-23 LAB — RPR: RPR Ser Ql: NONREACTIVE

## 2022-06-23 MED ORDER — LACTATED RINGERS IV SOLN: 500.0000 mL | Freq: Once | INTRAVENOUS | Status: DC

## 2022-06-23 MED ORDER — ONDANSETRON HCL 4 MG/2ML IJ SOLN: 4.0000 mg | Freq: Four times a day (QID) | INTRAMUSCULAR | Status: DC | PRN

## 2022-06-23 MED ORDER — DIPHENHYDRAMINE HCL 50 MG/ML IJ SOLN: 12.5000 mg | INTRAMUSCULAR | Status: DC | PRN

## 2022-06-23 MED ORDER — LACTATED RINGERS IV SOLN: 500.0000 mL | INTRAVENOUS | Status: DC | PRN

## 2022-06-23 MED ORDER — DIPHENHYDRAMINE HCL 25 MG PO CAPS: 25.0000 mg | ORAL_CAPSULE | Freq: Four times a day (QID) | ORAL | Status: DC | PRN

## 2022-06-23 MED ORDER — MISOPROSTOL 50MCG HALF TABLET
50.0000 ug | ORAL_TABLET | Freq: Once | ORAL | Status: DC
Start: 2022-06-23 — End: 2022-06-23

## 2022-06-23 MED ORDER — FENTANYL CITRATE (PF) 100 MCG/2ML IJ SOLN
50.0000 ug | INTRAMUSCULAR | Status: DC | PRN
  Administered 2022-06-23: 100 ug via INTRAVENOUS
  Filled 2022-06-23 (×2): qty 2

## 2022-06-23 MED ORDER — SENNOSIDES-DOCUSATE SODIUM 8.6-50 MG PO TABS
2.0000 | ORAL_TABLET | ORAL | Status: DC
  Administered 2022-06-23 – 2022-06-25 (×3): 2 via ORAL
  Filled 2022-06-23 (×3): qty 2

## 2022-06-23 MED ORDER — ACETAMINOPHEN 325 MG PO TABS: 650.0000 mg | ORAL_TABLET | ORAL | Status: DC | PRN

## 2022-06-23 MED ORDER — SOD CITRATE-CITRIC ACID 500-334 MG/5ML PO SOLN: 30.0000 mL | ORAL | Status: DC | PRN

## 2022-06-23 MED ORDER — PHENYLEPHRINE 80 MCG/ML (10ML) SYRINGE FOR IV PUSH (FOR BLOOD PRESSURE SUPPORT): 80.0000 ug | PREFILLED_SYRINGE | INTRAVENOUS | Status: DC | PRN

## 2022-06-23 MED ORDER — OXYTOCIN-SODIUM CHLORIDE 30-0.9 UT/500ML-% IV SOLN
2.5000 [IU]/h | INTRAVENOUS | Status: DC
  Filled 2022-06-23: qty 500

## 2022-06-23 MED ORDER — EPHEDRINE 5 MG/ML INJ: 10.0000 mg | INTRAVENOUS | Status: DC | PRN

## 2022-06-23 MED ORDER — DIBUCAINE (PERIANAL) 1 % EX OINT: 1.0000 | TOPICAL_OINTMENT | CUTANEOUS | Status: DC | PRN

## 2022-06-23 MED ORDER — FENTANYL-BUPIVACAINE-NACL 0.5-0.125-0.9 MG/250ML-% EP SOLN: 12.0000 mL/h | EPIDURAL | Status: DC | PRN

## 2022-06-23 MED ORDER — OXYTOCIN BOLUS FROM INFUSION
333.0000 mL | Freq: Once | INTRAVENOUS | Status: AC
  Administered 2022-06-23: 333 mL via INTRAVENOUS

## 2022-06-23 MED ORDER — ZOLPIDEM TARTRATE 5 MG PO TABS: 5.0000 mg | ORAL_TABLET | Freq: Every evening | ORAL | Status: DC | PRN

## 2022-06-23 MED ORDER — BENZOCAINE-MENTHOL 20-0.5 % EX AERO
1.0000 | INHALATION_SPRAY | CUTANEOUS | Status: DC | PRN
  Administered 2022-06-23: 1 via TOPICAL
  Filled 2022-06-23: qty 56

## 2022-06-23 MED ORDER — IBUPROFEN 600 MG PO TABS
600.0000 mg | ORAL_TABLET | Freq: Four times a day (QID) | ORAL | Status: DC
  Administered 2022-06-23 – 2022-06-25 (×7): 600 mg via ORAL
  Filled 2022-06-23 (×7): qty 1

## 2022-06-23 MED ORDER — ACETAMINOPHEN 325 MG PO TABS
650.0000 mg | ORAL_TABLET | ORAL | Status: DC | PRN
  Administered 2022-06-23 – 2022-06-24 (×2): 650 mg via ORAL
  Filled 2022-06-23 (×2): qty 2

## 2022-06-23 MED ORDER — COCONUT OIL OIL
1.0000 | TOPICAL_OIL | Status: DC | PRN
  Administered 2022-06-23: 1 via TOPICAL

## 2022-06-23 MED ORDER — OXYCODONE-ACETAMINOPHEN 5-325 MG PO TABS: 1.0000 | ORAL_TABLET | ORAL | Status: DC | PRN

## 2022-06-23 MED ORDER — ONDANSETRON HCL 4 MG PO TABS: 4.0000 mg | ORAL_TABLET | ORAL | Status: DC | PRN

## 2022-06-23 MED ORDER — TERBUTALINE SULFATE 1 MG/ML IJ SOLN: 0.2500 mg | Freq: Once | INTRAMUSCULAR | Status: DC | PRN

## 2022-06-23 MED ORDER — TETANUS-DIPHTH-ACELL PERTUSSIS 5-2.5-18.5 LF-MCG/0.5 IM SUSY: 0.5000 mL | PREFILLED_SYRINGE | Freq: Once | INTRAMUSCULAR | Status: DC

## 2022-06-23 MED ORDER — SIMETHICONE 80 MG PO CHEW: 80.0000 mg | CHEWABLE_TABLET | ORAL | Status: DC | PRN

## 2022-06-23 MED ORDER — LIDOCAINE HCL (PF) 1 % IJ SOLN
30.0000 mL | INTRAMUSCULAR | Status: AC | PRN
  Administered 2022-06-23: 30 mL via SUBCUTANEOUS
  Filled 2022-06-23: qty 30

## 2022-06-23 MED ORDER — LACTATED RINGERS IV SOLN
INTRAVENOUS | Status: DC
  Administered 2022-06-23: 1000 mL via INTRAVENOUS

## 2022-06-23 MED ORDER — MISOPROSTOL 25 MCG QUARTER TABLET
25.0000 ug | ORAL_TABLET | Freq: Once | ORAL | Status: DC
Start: 2022-06-23 — End: 2022-06-23

## 2022-06-23 MED ORDER — ONDANSETRON HCL 4 MG/2ML IJ SOLN: 4.0000 mg | INTRAMUSCULAR | Status: DC | PRN

## 2022-06-23 MED ORDER — WITCH HAZEL-GLYCERIN EX PADS: 1.0000 | MEDICATED_PAD | CUTANEOUS | Status: DC | PRN

## 2022-06-23 MED ORDER — PRENATAL MULTIVITAMIN CH
1.0000 | ORAL_TABLET | Freq: Every day | ORAL | Status: DC
  Administered 2022-06-24: 1 via ORAL
  Filled 2022-06-23: qty 1

## 2022-06-23 MED ORDER — OXYTOCIN-SODIUM CHLORIDE 30-0.9 UT/500ML-% IV SOLN: 1.0000 m[IU]/min | INTRAVENOUS | Status: DC

## 2022-06-23 NOTE — Lactation Note (Signed)
This note was copied from a baby's chart. Lactation Consultation Note  Patient Name: Madeline Wilkins [id number] Date: 06/23/2022 Age:29 hours Reason for consult: Initial assessment;Term  Initial visit to 8 hours old infant. Infant is actively cueing upon arrival. [facility] assisted with latch. Infant unable to latch after several attempts, infant gets sleepy at lactating parent's breast. Demonstrated hand expression, collected ~4-mL of colostrum and spoonfed. Provided hand pump with 21-mm flange, parent reports sensitive nipples.  Discussed normal newborn behavior and patterns, signs of good milk transfer, hunger cues, tummy size and benefits of skin to skin.   Plan: 1-Breastfeeding on demand or 8-12 times in 24h period. 2-Use manual pump as needed 3-Encouraged birthing parent rest, hydration and food intake.   Contact LC as needed for feeds/support/concerns/questions. All questions answered at this time. Provided Lactation services brochure and promoted local resources. Sent WIC referral on behalf of dyad.    Maternal Data Has patient been taught Hand Expression?: Yes Does the patient have breastfeeding experience prior to this delivery?: Yes How long did the patient breastfeed?: 1 month  Feeding Mother's Current Feeding Choice: Breast Milk and Formula  LATCH Score Latch: Repeated attempts needed to sustain latch, nipple held in mouth throughout feeding, stimulation needed to elicit sucking reflex.  Audible Swallowing: None  Type of Nipple: Everted at rest and after stimulation  Comfort (Breast/Nipple): Soft / non-tender  Hold (Positioning): Assistance needed to correctly position infant at breast and maintain latch.  LATCH Score: 6   Lactation Tools Discussed/Used Tools: Pump;Flanges Flange Size: 21 Breast pump type: Manual Pump Education: Milk Storage;Setup, frequency, and cleaning Reason for Pumping: mother's request Pumping frequency: as needed Pumped volume: 2  mL  Interventions Interventions: Breast feeding basics reviewed;Assisted with latch;Skin to skin;Breast massage;Hand express;Breast compression;Hand pump;Expressed milk;Position options;Support pillows;Adjust position;Education;LC Services brochure  Discharge Pump: Manual WIC Program: No  Consult Status Consult Status: Follow-up Date: 06/24/22 Follow-up type: In-patient    Euan Wandler A Higuera Ancidey 06/23/2022, 3:21 PM

## 2022-06-23 NOTE — Lactation Note (Signed)
This note was copied from a baby's chart. Lactation Consultation Note  Patient Name: Girl Day'Jai Heater [id number] Date: 06/23/2022 Age:29 hours   Birthing parent and infant are sleeping upon visit. LC will come back to room at another time as possible.      LATCH Score Latch: Grasps breast easily, tongue down, lips flanged, rhythmical sucking.  Audible Swallowing: Spontaneous and intermittent  Type of Nipple: Everted at rest and after stimulation  Comfort (Breast/Nipple): Soft / non-tender  Hold (Positioning): Assistance needed to correctly position infant at breast and maintain latch.  LATCH Score: 9   Interventions Interventions: Breast feeding basics reviewed;Assisted with latch   Clorene Nerio A Higuera Ancidey 06/23/2022, 11:31 AM

## 2022-06-23 NOTE — Discharge Summary (Signed)
Postpartum Discharge Summary    Patient Name: Madeline Wilkins DOB: [birth year] MRN: [id number]  Date of admission: 06/23/2022 Delivery date:06/23/2022  Delivering provider: Celedonio Savage  Date of discharge: 06/24/2022  Admitting diagnosis: Post term pregnancy over 40 weeks [O48.0] Intrauterine pregnancy: [redacted]w[redacted]d     Secondary diagnosis:  Active Problems:   Hyperprolactinemia   Encounter for supervision of low-risk pregnancy in third trimester   Vaginal delivery  Additional problems: N/A    Discharge diagnosis: Term Pregnancy Delivered                                              Post partum procedures: None Augmentation:  None Complications: None  Hospital course: Onset of Labor With Vaginal Delivery      27 y.o. yo [id number] at [redacted]w[redacted]d was admitted in Active Labor on 06/23/2022. Labor course was uncomplicated. Membrane Rupture Time/Date:  ,   Delivery Method:Vaginal, Spontaneous  Episiotomy: None  Lacerations:  1st degree  Patient had a postpartum course that was uncomplicated.  She is ambulating, tolerating a regular diet, passing flatus, and urinating well. Patient is discharged home in stable condition on 06/24/22.  Newborn Data: Birth date:06/23/2022  Birth time:7:01 AM  Gender:Female  Living status:Living  Apgars:7 ,10  Weight:4380 g   Magnesium Sulfate received: No BMZ received: No Rhophylac:N/A MMR:N/A T-DaP: ordered postpartum  Flu: N/A Transfusion:No  Physical exam  Vitals:   06/23/22 0718 06/23/22 0731 06/23/22 0746 06/23/22 0801  BP: 115/68 97/66 124/72 119/81  Pulse: 86 80 84 83  Resp: 18 18 18 18   Temp: 97.8 F (36.6 C)     TempSrc: Oral     SpO2:       General: alert, cooperative, and no distress Lochia: appropriate Uterine Fundus: firm Incision: N/A DVT Evaluation: No evidence of DVT seen on physical exam. Labs: Lab Results  Component Value Date   WBC 5.6 06/23/2022   HGB 11.2 (L) 06/23/2022   HCT 32.9 (L) 06/23/2022   MCV 90.4  06/23/2022   PLT 207 06/23/2022      Latest Ref Rng & Units 12/12/2021   10:53 AM  CMP  Glucose 70 - 99 mg/dL 83   BUN 6 - 20 mg/dL 9   Creatinine 1.47 - 8.29 mg/dL 5.62   Sodium 130 - 865 mmol/L 135   Potassium 3.5 - 5.2 mmol/L 4.7   Chloride 96 - 106 mmol/L 103   CO2 20 - 29 mmol/L 20   Calcium 8.7 - 10.2 mg/dL 9.1   Total Protein 6.0 - 8.5 g/dL 6.3   Total Bilirubin 0.0 - 1.2 mg/dL 0.4   Alkaline Phos 44 - 121 IU/L 47   AST 0 - 40 IU/L 13   ALT 0 - 32 IU/L 12    Edinburgh Score:     No data to display           After visit meds:  Allergies as of 06/24/2022       Reactions   Shellfish Allergy         Medication List     TAKE these medications    acetaminophen 325 MG tablet Commonly known as: Tylenol Take 2 tablets (650 mg total) by mouth every 4 (four) hours as needed for up to 30 doses (for pain scale < 4).   ibuprofen 600 MG tablet Commonly known as: ADVIL  Take 1 tablet (600 mg total) by mouth every 6 (six) hours.   Prenatal Vitamin 27-0.8 MG Tabs Take 1 tablet by mouth daily.         Discharge home in stable condition Infant Feeding: Breast Infant Disposition:home with mother Discharge instruction: per After Visit Summary and Postpartum booklet. Activity: Advance as tolerated. Pelvic rest for 6 weeks.  Diet: routine diet Future Appointments: Future Appointments  Date Time Provider Department Center  06/25/2022  8:30 AM [facility] NURSE [facility]  06/25/2022  8:45 AM [facility] NST [facility]  06/27/2022  2:15 PM Celedonio Savage, MD [facility]  07/01/2022  7:00 AM [facility] SCHED ROOM [facility] None   Follow up Visit: Message sent   Please schedule this patient for a In person postpartum visit in 6 weeks with the following provider: Any provider. Additional Postpartum F/U: NA   Low risk pregnancy complicated by:  NA Delivery mode:  Vaginal, Spontaneous  Anticipated Birth Control: Depo Provera at discharge   Clayton Bibles, MSA, MSN,  CNM Certified Nurse Midwife, Biochemist, clinical for [employer], [facility]

## 2022-06-23 NOTE — H&P (Signed)
OBSTETRIC ADMISSION HISTORY AND PHYSICAL  Madeline Wilkins is a 27 y.o. female [phone] with IUP at [redacted]w[redacted]d by LMP presenting for SOL - been contracting regularly q79min since 0200. She reports +FMs, No LOF, no VB, no blurry vision, headaches or peripheral edema, and RUQ pain.  She plans on breast and formula feeding. She requests nothing for birth control. She received her prenatal care at [facility].  Dating: By LMP --->  Estimated Date of Delivery: 06/24/22 Sono:  , CWD, normal anatomy, breech presentation, fundal placenta, 1000g, 77% EFW  Prenatal History/Complications: hyperprolactinemia, fasting level 116, needs postpartum MRI  Nursing Staff Provider  Office Location [facility] Dating  06/24/2022, by Last Menstrual Period  [facility] Model Arly.Keller ] Traditional  Centering  Mom-Baby Dyad Anatomy US  Normal, additional views needed  Language  English     Flu Vaccine  Received at outside facility per patient Genetic/Carrier Screen  NIPS:   Low risk AFP:   not done Horizon: neg 4/4  TDaP Vaccine   declined Hgb A1C or  GTT Early - normal A1C 5.1 Third trimester - normal 1 hr  COVID Vaccine    LAB RESULTS   Rhogam  A/Positive/-- (09/27 1632)  Blood Type A/Positive/-- (09/27 1632)   Baby Feeding Plan Breast & Bottle  Antibody Negative (09/27 1632)  Contraception Declined  Rubella 1.81 (09/27 1632)  Circumcision If boy, Yes  RPR Non Reactive (09/27 1632)   Pediatrician  Family Medicine HBsAg Negative (09/27 1632)   Support Person FOB HCVAb Non Reactive (09/27 1632)   Prenatal Classes  HIV Non Reactive (09/27 1632)     BTL Consent  GBS  Negative  VBAC Consent  Pap Diagnosis  Date Value Ref Range Status  07/26/2019   Final   - Negative for intraepithelial lesion or malignancy (NILM)         DME Rx  BP cuff  Weight Scale Waterbirth   Class  Consent  CNM visit  PHQ9 & GAD7 [  ] new OB [  ] 28 weeks  [  ] 36 weeks Induction   Orders Entered Foley Y/N   Past Medical  History: 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   Patient will need to be screened every 3 months 02/2015 nonreactive; exposed in Summer 2016, former partner incarcerated March 2017  NR July 2017  NR   Sept 2017  NR   Infection    UTI   Kidney stone    Trichomonal infection    Urinary tract infection 02/23/2015   Past Surgical History: Past Surgical History:  Procedure Laterality Date   INDUCED ABORTION     WISDOM TOOTH EXTRACTION Bilateral    Bottom Teeth   Obstetrical History: OB History     Gravida  4   Para  1   Term  1   Preterm  0   AB  2   Living  1      SAB  0   IAB  2   Ectopic  0   Multiple  0   Live Births  1        Obstetric Comments  G2: mid December 2017 in office D&C (elective AB)        Social History Social History   Socioeconomic History   Marital status: Single    Spouse name: Not on file   Number of  children: Not on file   Years of education: Not on file   Highest education level: Not on file  Occupational History    Employer: [employer]  Tobacco Use   Smoking status: Never   Smokeless tobacco: Never  Vaping Use   Vaping Use: Never used  Substance and Sexual Activity   Alcohol use: Not Currently   Drug use: No   Sexual activity: Yes    Birth control/protection: None  Other Topics Concern   Not on file  Social History Narrative   Not on file   Social Determinants of Health   Financial Resource Strain: Not on file  Food Insecurity: No Food Insecurity (06/23/2022)   Hunger Vital Sign    Worried About Running Out of Food in the Last Year: Never true    Ran Out of Food in the Last Year: Never true  Transportation Needs: No Transportation Needs (06/23/2022)   PRAPARE - Administrator, Civil Service (Medical): No    Lack of Transportation (Non-Medical): No  Physical Activity: Not on file  Stress: Not on file  Social Connections:  Not on file   Family History: Family History  Problem Relation Age of Onset   Healthy Mother    Healthy Father    Cancer Neg Hx    Diabetes Neg Hx    Heart disease Neg Hx    Hypertension Neg Hx    Stroke Neg Hx    Hearing loss Neg Hx    Asthma Neg Hx    Other Neg Hx        elev prolactin   Allergies: Allergies  Allergen Reactions   Shellfish Allergy    Medications Prior to Admission  Medication Sig Dispense Refill Last Dose   Prenatal Vit-Fe Fumarate-FA (PRENATAL VITAMIN) 27-0.8 MG TABS Take 1 tablet by mouth daily. 30 tablet 2 Past Week   Review of Systems  All systems reviewed and negative except as stated in HPI  Blood pressure 124/72, pulse 84, temperature 97.8 F (36.6 C), temperature source Oral, resp. rate 18, last menstrual period 09/17/2021, SpO2 100 %. General appearance: alert, cooperative, and severe distress Lungs: normal work of breathing Heart: regular rate  Abdomen: soft, non-tender; Pelvic: 10/100/+2 Extremities: Homans sign is negative, no sign of DVT Presentation: cephalic Fetal monitoring - Baseline: 130 bpm, Variability: Good {> 6 bpm), Accelerations: Reactive, and Decelerations: Absent Uterine activity: Date/time of onset: 06/23/22 at 0200, Frequency: Every 3 minutes, Duration: 60 seconds, and Intensity: strong  Dilation: 4 Effacement (%): 90 Station: -3 Exam by:: [employer], RN  Prenatal labs: ABO, Rh: --/--/A POS (04/21 0543) Antibody: NEG (04/21 0543) Rubella: 1.81 (09/27 1632) RPR: Non Reactive (01/25 1125)  HBsAg: Negative (09/27 1632)  HIV: Non Reactive (01/25 1125)  GBS: Negative/-- (03/27 1655)  1 hr GTT: 84 Genetic screening: LR NIPS, negative carrier screen Anatomy US normal  Prenatal Transfer Tool  Maternal Diabetes: No Genetic Screening: Normal Maternal Ultrasounds/Referrals: Normal Fetal Ultrasounds or other Referrals:  None Maternal Substance Abuse:  No Significant Maternal Medications:  None Significant  Maternal Lab Results:  Group B Strep negative Number of Prenatal Visits:greater than 3 verified prenatal visits Other Comments:  None  Results for orders placed or performed during the hospital encounter of 06/23/22 (from the past 24 hour(s))  CBC   Collection Time: 06/23/22  5:43 AM  Result Value Ref Range   WBC 5.6 4.0 - 10.5 K/uL   RBC 3.64 (L) 3.87 - 5.11 MIL/uL  Hemoglobin 11.2 (L) 12.0 - 15.0 g/dL   HCT 16.1 (L) 09.6 - 04.5 %   MCV 90.4 80.0 - 100.0 fL   MCH 30.8 26.0 - 34.0 pg   MCHC 34.0 30.0 - 36.0 g/dL   RDW 40.9 81.1 - 91.4 %   Platelets 207 150 - 400 K/uL   nRBC 0.0 0.0 - 0.2 %  Type and screen   Collection Time: 06/23/22  5:43 AM  Result Value Ref Range   ABO/RH(D) A POS    Antibody Screen NEG    Sample Expiration      06/26/2022,2359 Performed at [facility], [address], [place], [place] [address]     Patient Active Problem List   Diagnosis Date Noted   Encounter for supervision of low-risk pregnancy in third trimester 11/28/2021   Hyperprolactinemia 07/20/2020    Assessment/Plan:  Madeline Wilkins is a 27 y.o. G4P1021 at [redacted]w[redacted]d here for spontaneous onset of labor  #Labor: Latent into active phase, expectant management #Pain: Considering epidural #FWB: Cat 1, became less reactive as she was moving from MAU to L&D #ID: GBS neg #MOF: both #MOC: none  Celedonio Savage, MD  06/23/2022, 7:54 AM

## 2022-06-23 NOTE — MAU Note (Signed)
.  Madeline Wilkins is a 27 y.o. at [redacted]w[redacted]d here in MAU reporting: contractions that began at 0200 this morning - Pt denies SROM, vaginal bleeding or bloody show. Endorses + fetal movement.   To room 123 SVE  4/90/-3 vertex Onset of complaint: 0200 Pain score: 10 There were no vitals filed for this visit.   FHT:143bpm Lab orders placed from triage:  mau labor

## 2022-06-24 DIAGNOSIS — F321 Major depressive disorder, single episode, moderate: Secondary | ICD-10-CM | POA: Diagnosis not present

## 2022-06-24 MED ORDER — MEDROXYPROGESTERONE ACETATE 150 MG/ML IM SUSP
150.0000 mg | Freq: Once | INTRAMUSCULAR | Status: AC
  Administered 2022-06-24: 150 mg via INTRAMUSCULAR
  Filled 2022-06-24: qty 1

## 2022-06-24 MED ORDER — ACETAMINOPHEN 325 MG PO TABS: 650.0000 mg | ORAL_TABLET | ORAL | 0 refills | Status: DC | PRN

## 2022-06-24 MED ORDER — IBUPROFEN 600 MG PO TABS: 600.0000 mg | ORAL_TABLET | Freq: Four times a day (QID) | ORAL | 0 refills | Status: DC

## 2022-06-24 NOTE — Progress Notes (Signed)
Patient has an early (24 hr) discharge order written by MD this AM. Infant unable to be discharged to home until tomorrow. Notified Dr. Torrie Mayers. Orders taken to cancel patients discharge order.

## 2022-06-24 NOTE — Lactation Note (Signed)
This note was copied from a baby's chart. Lactation Consultation Note  Patient Name: Madeline Wilkins [id number] Date: 06/24/2022 Age:29 hours Reason for consult: Follow-up assessment;Term RN stated baby has been BF a lot. LC assessed baby on the breast and it looked good. Suggested mom occasionally massage breast during feeding for now. Mom is trying to just BF for now. Praised mom for doing a good job. Mom stated the baby is peeing and pooping a lot. RN to update charting.  Maternal Data    Feeding Mother's Current Feeding Choice: Breast Milk  LATCH Score Latch: Grasps breast easily, tongue down, lips flanged, rhythmical sucking.  Audible Swallowing: A few with stimulation  Type of Nipple: Everted at rest and after stimulation  Comfort (Breast/Nipple): Soft / non-tender  Hold (Positioning): No assistance needed to correctly position infant at breast.  LATCH Score: 9   Lactation Tools Discussed/Used    Interventions    Discharge    Consult Status Consult Status: Follow-up Date: 06/24/22 Follow-up type: In-patient    Madeline Wilkins, Diamond Nickel 06/24/2022, 3:22 AM

## 2022-06-24 NOTE — Discharge Instructions (Signed)

## 2022-06-24 NOTE — Lactation Note (Signed)
This note was copied from a baby's chart. Lactation Consultation Note  Patient Name: Girl Day Sloka Volante [id number] Date: 06/24/2022 Age:29 hours  P2, Per Birth Parent BF is going well and infant is breastfeeding both breast for total of 30 minutes or longer. Having breast soreness she has coconut oil and knows to apply EBM to help with soreness. Birth Parent plans to exclusively BF infant. Birth Parent has Medela hand pump and given Spectra 2 DEBP ( Stork  pump) with her insurance. Birth Parent will try different BF positions, mostly using the cross cradle. Birth Parent will continue to BF infant by cues, on demand, 8 to 12+ times within 24 hours, STS. Birth Parent knows to stay hydrated, rest and have balance meals. Birth Parent has not been documenting voids and stools having RN to record it. LC did not observe latch infant recently BF for 30 minutes prior to [facility] entering the room. Infant is currently cluster feeding, Birth Parent understands that this is normal behavior on day 2 of life. Birth Parent demonstrated hand expression to [facility]  and when doing  hand expression  her  EBM shot across the room.    Maternal Data    Feeding    LATCH Score                    Lactation Tools Discussed/Used    Interventions    Discharge    Consult Status      Frederico Hamman 06/24/2022, 7:52 PM

## 2022-06-25 ENCOUNTER — Ambulatory Visit: Payer: Medicaid Other

## 2022-06-25 ENCOUNTER — Other Ambulatory Visit: Payer: Commercial Managed Care - HMO

## 2022-06-25 DIAGNOSIS — F321 Major depressive disorder, single episode, moderate: Secondary | ICD-10-CM | POA: Diagnosis not present

## 2022-06-25 NOTE — Lactation Note (Signed)
This note was copied from a baby's chart. Lactation Consultation Note  Patient Name: Madeline Wilkins [id number] Date: 06/25/2022 Age:29 hours Reason for consult: Follow-up assessment  P2, Mother's breasts are filling. Baby recently breastfed for 30 min and is sleeping next to mother.  Feed on demand with cues.  Goal 8-12+ times per day after first 24 hrs.  Place baby STS if not cueing.  Reviewed engorgement care and monitoring voids/stools.   Feeding Mother's Current Feeding Choice: Breast Milk Interventions Interventions: Education  Discharge Discharge Education: Engorgement and breast care;Warning signs for feeding baby  Consult Status Consult Status: Complete Date: 06/25/22    Dahlia Byes [facility] 06/25/2022, 10:44 AM

## 2022-06-25 NOTE — Discharge Summary (Signed)
Postpartum Discharge Summary    Patient Name: Madeline Wilkins DOB: 28-Mar-1995 MRN: [id number]  Date of admission: 06/23/2022 Delivery date:06/23/2022  Delivering provider: Celedonio Savage  Date of discharge: 06/25/2022  Admitting diagnosis: Post term pregnancy over 40 weeks [O48.0] Intrauterine pregnancy: 107w6d     Secondary diagnosis:  Active Problems:   Hyperprolactinemia   Encounter for supervision of low-risk pregnancy in third trimester   Vaginal delivery   History of delivery of macrosomal infant  Additional problems: N/A    Discharge diagnosis: Term Pregnancy Delivered                                              Post partum procedures: None Augmentation:  None Complications: None  Hospital course: Onset of Labor With Vaginal Delivery      27 y.o. yo [id number] at [redacted]w[redacted]d was admitted in Active Labor on 06/23/2022. Labor course was uncomplicated. Membrane Rupture Time/Date:  ,   Delivery Method:Vaginal, Spontaneous  Episiotomy: None  Lacerations:  1st degree  Patient had a postpartum course that was uncomplicated.  She is ambulating, tolerating a regular diet, passing flatus, and urinating well. Patient is discharged home in stable condition on 06/25/22.  Newborn Data: Birth date:06/23/2022  Birth time:7:01 AM  Gender:Female  Living status:Living  Apgars:7 ,10  Weight:4380 g   Magnesium Sulfate received: No BMZ received: No Rhophylac:N/A MMR:N/A T-DaP: ordered postpartum  Flu: N/A Transfusion:No  Physical exam  Vitals:   06/24/22 0510 06/24/22 1429 06/24/22 2040 06/25/22 0520  BP: 125/85 112/80 107/66 116/80  Pulse: 98 79 88 78  Resp: Temp: 98.2 F (36.8 C) 98.1 F (36.7 C) 98.4 F (36.9 C) 97.6 F (36.4 C)  TempSrc:  Oral Oral Oral  SpO2: 100% 99%     General: alert, cooperative, and no distress Lochia: appropriate Uterine Fundus: firm Incision: N/A DVT Evaluation: No evidence of DVT seen on physical exam. Labs: Lab Results   Component Value Date   WBC 5.6 06/23/2022   HGB 11.2 (L) 06/23/2022   HCT 32.9 (L) 06/23/2022   MCV 90.4 06/23/2022   PLT 207 06/23/2022      Latest Ref Rng & Units 12/12/2021   10:53 AM  CMP  Glucose 70 - 99 mg/dL 83   BUN 6 - 20 mg/dL 9   Creatinine 3.87 - 5.64 mg/dL 3.32   Sodium 951 - 884 mmol/L 135   Potassium 3.5 - 5.2 mmol/L 4.7   Chloride 96 - 106 mmol/L 103   CO2 20 - 29 mmol/L 20   Calcium 8.7 - 10.2 mg/dL 9.1   Total Protein 6.0 - 8.5 g/dL 6.3   Total Bilirubin 0.0 - 1.2 mg/dL 0.4   Alkaline Phos 44 - 121 IU/L 47   AST 0 - 40 IU/L 13   ALT 0 - 32 IU/L 12    Edinburgh Score:    06/24/2022    9:48 AM  Edinburgh Postnatal Depression Scale Screening Tool  I have been able to laugh and see the funny side of things. 0  I have looked forward with enjoyment to things. 0  I have blamed myself unnecessarily when things went wrong. 0  I have been anxious or worried for no good reason. 0  I have felt scared or panicky for no good reason. 0  Things have been getting  on top of me. 0  I have been so unhappy that I have had difficulty sleeping. 1  I have felt sad or miserable. 0  I have been so unhappy that I have been crying. 0  The thought of harming myself has occurred to me. 0  Edinburgh Postnatal Depression Scale Total 1     After visit meds:  Allergies as of 06/25/2022       Reactions   Shellfish Allergy         Medication List     TAKE these medications    acetaminophen 325 MG tablet Commonly known as: Tylenol Take 2 tablets (650 mg total) by mouth every 4 (four) hours as needed for up to 30 doses (for pain scale < 4).   ibuprofen 600 MG tablet Commonly known as: ADVIL Take 1 tablet (600 mg total) by mouth every 6 (six) hours.   Prenatal Vitamin 27-0.8 MG Tabs Take 1 tablet by mouth daily.         Discharge home in stable condition Infant Feeding: Breast Infant Disposition:home with mother Discharge instruction: per After Visit Summary  and Postpartum booklet. Activity: Advance as tolerated. Pelvic rest for 6 weeks.  Diet: routine diet Future Appointments: Future Appointments  Date Time Provider Department Center  06/27/2022  2:15 PM Celedonio Savage, MD [facility]   Follow up Visit:  Follow-up Information     Fayette Pho, MD Follow up.   Specialty: Family Medicine Why: We will have the front desk admin from the family medicine clinic call you to set up your new patient appointment. You will see Dr. Larita Fife, the assigned primary care doctor (PCP) for your two children. Contact information: [address] [place] [address] [phone]                Message sent   Please schedule this patient for a In person postpartum visit in 6 weeks with the following provider: Any provider. Additional Postpartum F/U: NA   Low risk pregnancy complicated by:  NA Delivery mode:  Vaginal, Spontaneous  Anticipated Birth Control: Depo Provera at discharge   Levie Heritage, DO  Certified Nurse Midwife, [facility] for [employer], [facility]

## 2022-06-27 ENCOUNTER — Encounter: Payer: Commercial Managed Care - HMO | Admitting: Family Medicine

## 2022-07-01 ENCOUNTER — Inpatient Hospital Stay (HOSPITAL_COMMUNITY): Payer: Medicaid Other

## 2022-07-01 ENCOUNTER — Inpatient Hospital Stay (HOSPITAL_COMMUNITY)
Admission: RE | Admit: 2022-07-01 | Payer: Medicaid Other | Source: Home / Self Care | Admitting: Obstetrics and Gynecology

## 2022-07-02 ENCOUNTER — Ambulatory Visit: Payer: Medicaid Other

## 2022-07-02 ENCOUNTER — Telehealth: Payer: Self-pay | Admitting: Family Medicine

## 2022-07-02 MED ORDER — POLYETHYLENE GLYCOL 3350 17 GM/SCOOP PO POWD: 17.0000 g | Freq: Every day | ORAL | 0 refills | Status: DC

## 2022-07-02 MED ORDER — ACETAMINOPHEN 500 MG PO TABS: 1000.0000 mg | ORAL_TABLET | Freq: Three times a day (TID) | ORAL | 0 refills | Status: DC | PRN

## 2022-07-02 NOTE — Telephone Encounter (Signed)
Patient said she has been having pelvic and side pain the past two days

## 2022-07-02 NOTE — Telephone Encounter (Signed)
Spoke with patient regarding pelvic pain and pressure.  Reports that she has been having major issues with constipation discussed MiraLAX.  Prescription sent to pharmacy for MiraLAX.  Also discussed use of Tylenol on top of the ibuprofen.  Prescription sent for Tylenol.  She will come in for STD screening this afternoon.  No further questions or concerns.

## 2022-07-02 NOTE — Telephone Encounter (Signed)
Patient called a second time and call transferred to clinical staff. Reports abdominal pain and pressure on pelvis, especially when standing or turning. Taking ibuprofen every 6 hours. Was not given Tylenol when she went to [facility] to pick up medication. Pt reports recently going 3 days without bowel movement. Reports single bowel movement yesterday that was painful. Call transferred to [facility], MD, who recommends adding Tylenol for pain and Miralax for constipation. Pt to come in for nurse visit today at 3:40 to complete vaginal swab for infection.

## 2022-07-03 ENCOUNTER — Other Ambulatory Visit: Payer: Self-pay

## 2022-07-03 ENCOUNTER — Other Ambulatory Visit (HOSPITAL_COMMUNITY)
Admission: RE | Admit: 2022-07-03 | Discharge: 2022-07-03 | Disposition: A | Discharge status: Home / Self Care | Payer: Medicaid Other | Source: Ambulatory Visit | Attending: Family Medicine | Admitting: Family Medicine

## 2022-07-03 ENCOUNTER — Ambulatory Visit (INDEPENDENT_AMBULATORY_CARE_PROVIDER_SITE_OTHER): Payer: Medicaid Other

## 2022-07-03 VITALS — BP 126/78 | HR 69 | Ht 67.0 in

## 2022-07-03 DIAGNOSIS — Z113 Encounter for screening for infections with a predominantly sexual mode of transmission: Secondary | ICD-10-CM

## 2022-07-03 NOTE — Progress Notes (Signed)
Pt here today for STD screening.  Pt explained how to obtain self swab and that we will call with abnormal results.  Pt verbalized understanding.   Leonette Nutting   07/03/22

## 2022-07-04 DIAGNOSIS — F321 Major depressive disorder, single episode, moderate: Secondary | ICD-10-CM | POA: Diagnosis not present

## 2022-07-04 LAB — HIV ANTIBODY (ROUTINE TESTING W REFLEX): HIV Screen 4th Generation wRfx: NONREACTIVE

## 2022-07-04 LAB — CERVICOVAGINAL ANCILLARY ONLY
Bacterial Vaginitis (gardnerella): POSITIVE — AB
Candida Glabrata: NEGATIVE
Candida Vaginitis: NEGATIVE
Chlamydia: NEGATIVE
Comment: NEGATIVE
Comment: NEGATIVE
Comment: NEGATIVE
Comment: NEGATIVE
Comment: NEGATIVE
Comment: NORMAL
Neisseria Gonorrhea: NEGATIVE
Trichomonas: NEGATIVE

## 2022-07-04 LAB — HEPATITIS B SURFACE ANTIGEN: Hepatitis B Surface Ag: NEGATIVE

## 2022-07-04 LAB — HEPATITIS C ANTIBODY: Hep C Virus Ab: NONREACTIVE

## 2022-07-06 DIAGNOSIS — F321 Major depressive disorder, single episode, moderate: Secondary | ICD-10-CM | POA: Diagnosis not present

## 2022-07-06 NOTE — Progress Notes (Unsigned)
   SUBJECTIVE:   CHIEF COMPLAINT / HPI:   Annual physical  [name] is a pleasant 27 y.o. woman who presents for her annual physical exam.  Medications and allergies updated.  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   Postpartum care following vaginal delivery 07/08/2022   History of delivery of macrosomal infant 06/23/2022   Hyperprolactinemia (HCC) 07/20/2020    OBJECTIVE:   BP 123/86   Pulse 93   Ht 5\' 7"  (1.702 m)   Wt 169 lb 12.8 oz (77 kg)   LMP 09/17/2021   SpO2 94%   Breastfeeding Yes   BMI 26.59 kg/m    PHQ-9:     12/12/2021   11:04 AM 05/10/2016    9:42 AM 01/15/2016    1:08 PM  Depression screen PHQ 2/9  Decreased Interest 0 0 0  Down, Depressed, Hopeless 0 0 0  PHQ - 2 Score 0 0 0  Altered sleeping 0 0 0  Tired, decreased energy 0 0 0  Change in appetite 0 0 0  Feeling bad or failure about yourself  0 0 0  Trouble concentrating 0 0 0  Moving slowly or fidgety/restless 0 0 0  Suicidal thoughts 0 0 0  PHQ-9 Score 0 0 0    Physical Exam General: Awake, alert, oriented Cardiovascular: Regular rate and rhythm, S1 and S2 present, no murmurs auscultated Respiratory: Lung fields clear to auscultation bilaterally  ASSESSMENT/PLAN:   Healthcare maintenance Patient presents today for establishment of care with our clinic.  Her children also go here for care.  She is up-to-date on labs and is 2 weeks postpartum from a vaginal delivery.  No labs indicated at this time.  Depot contraception Patient received Depo-Provera 4/22 in the hospital s/p SVD.  Next due Depo Provera July 8-22.    Cervical cancer screening Due late May, however  will postpone until July when she gets her Depo Provera given that she is currently 2 weeks postpartum.  Postpartum care following vaginal delivery Today patient verbally reports that her mood is stable and has no concerns for postpartum blues or depression.  No thoughts of self-harm or hurting the baby.  Discussed that I am very glad she is having no issues with mood at this time.  Reassured her that her depression can occur up to 12 months after delivery.  Encouraged her to seek support if she begins to have these feelings.    Madeline Pho, MD [facility]

## 2022-07-07 DIAGNOSIS — F321 Major depressive disorder, single episode, moderate: Secondary | ICD-10-CM | POA: Diagnosis not present

## 2022-07-08 ENCOUNTER — Encounter: Payer: Self-pay | Admitting: Family Medicine

## 2022-07-08 ENCOUNTER — Ambulatory Visit (INDEPENDENT_AMBULATORY_CARE_PROVIDER_SITE_OTHER): Payer: Medicaid Other | Admitting: Family Medicine

## 2022-07-08 ENCOUNTER — Other Ambulatory Visit: Payer: Self-pay

## 2022-07-08 VITALS — BP 123/86 | HR 93 | Ht 67.0 in | Wt 169.8 lb

## 2022-07-08 DIAGNOSIS — Z124 Encounter for screening for malignant neoplasm of cervix: Secondary | ICD-10-CM | POA: Diagnosis not present

## 2022-07-08 DIAGNOSIS — F321 Major depressive disorder, single episode, moderate: Secondary | ICD-10-CM | POA: Diagnosis not present

## 2022-07-08 DIAGNOSIS — Z3042 Encounter for surveillance of injectable contraceptive: Secondary | ICD-10-CM

## 2022-07-08 DIAGNOSIS — Z0000 Encounter for general adult medical examination without abnormal findings: Secondary | ICD-10-CM | POA: Diagnosis not present

## 2022-07-08 NOTE — Assessment & Plan Note (Signed)
Today patient verbally reports that her mood is stable and has no concerns for postpartum blues or depression.  No thoughts of self-harm or hurting the baby.  Discussed that I am very glad she is having no issues with mood at this time.  Reassured her that her depression can occur up to 12 months after delivery.  Encouraged her to seek support if she begins to have these feelings.

## 2022-07-08 NOTE — Assessment & Plan Note (Signed)
Patient presents today for establishment of care with our clinic.  Her children also go here for care.  She is up-to-date on labs and is 2 weeks postpartum from a vaginal delivery.  No labs indicated at this time.

## 2022-07-08 NOTE — Assessment & Plan Note (Signed)
Patient received Depo-Provera 4/22 in the hospital s/p SVD.  Next due Depo Provera July 8-22.

## 2022-07-08 NOTE — Assessment & Plan Note (Signed)
Due late May, however will postpone until July when she gets her Depo Provera given that she is currently 2 weeks postpartum.

## 2022-07-08 NOTE — Patient Instructions (Signed)
It was wonderful to see you today. Thank you for allowing me to be a part of your care. Below is a short summary of what we discussed at your visit today:  Establishing here at [facility] You are all up-to-date on blood work at this time.  We will keep you up-to-date with routine blood work as needed.  Pap smear You are due at the end of May, however we can delay this a little bit since you just gave birth. Come back at your convenience for a Pap smear.  This can happen at the same time as your next Depo shot if you would like.  Depo You received a Depo shot in the hospital on April 22. This means you are next due for your next Depo shot July 8 - July 22. Please make an appointment to be seen during this time period.   Please bring all of your medications to every appointment!  If you have any questions or concerns, please do not hesitate to contact us via phone or MyChart message.   Fayette Pho, MD

## 2022-07-10 ENCOUNTER — Telehealth: Payer: Self-pay | Admitting: Lactation Services

## 2022-07-10 MED ORDER — METRONIDAZOLE 500 MG PO TABS: 500.0000 mg | ORAL_TABLET | Freq: Two times a day (BID) | ORAL | 0 refills | Status: DC

## 2022-07-10 NOTE — Telephone Encounter (Signed)
Called patient with results of vaginal swab showing she has BV. Patient did not answer. LM that a prescription sent to Pharmacy and to check her My Chart message.

## 2022-07-13 ENCOUNTER — Telehealth: Payer: Self-pay | Admitting: Family Medicine

## 2022-07-13 NOTE — Telephone Encounter (Signed)
Erroneous encounter

## 2022-08-08 ENCOUNTER — Ambulatory Visit: Payer: Medicaid Other | Admitting: Obstetrics & Gynecology

## 2022-09-05 DIAGNOSIS — F321 Major depressive disorder, single episode, moderate: Secondary | ICD-10-CM | POA: Diagnosis not present

## 2022-09-10 ENCOUNTER — Ambulatory Visit: Payer: Medicaid Other

## 2022-09-16 ENCOUNTER — Ambulatory Visit: Payer: Medicaid Other

## 2022-09-20 ENCOUNTER — Ambulatory Visit (INDEPENDENT_AMBULATORY_CARE_PROVIDER_SITE_OTHER): Payer: Medicaid Other

## 2022-09-20 DIAGNOSIS — Z3042 Encounter for surveillance of injectable contraceptive: Secondary | ICD-10-CM

## 2022-09-20 MED ORDER — MEDROXYPROGESTERONE ACETATE 150 MG/ML IM SUSY
150.0000 mg | PREFILLED_SYRINGE | Freq: Once | INTRAMUSCULAR | Status: AC
Start: 2022-09-20 — End: 2022-09-20
  Administered 2022-09-20: 150 mg via INTRAMUSCULAR

## 2022-09-20 NOTE — Progress Notes (Signed)
Patient here today for Depo Provera injection and is within her dates.    Last contraceptive appt was 07/08/22  Depo given in RUOQ today.  Site unremarkable & patient tolerated injection.    Next injection due 12/06/22-12/20/22.  Reminder card given.    Veronda Prude, RN

## 2022-10-15 ENCOUNTER — Encounter: Payer: Self-pay | Admitting: Student

## 2022-10-15 ENCOUNTER — Ambulatory Visit (INDEPENDENT_AMBULATORY_CARE_PROVIDER_SITE_OTHER): Payer: Medicaid Other | Admitting: Student

## 2022-10-15 ENCOUNTER — Other Ambulatory Visit: Payer: Self-pay

## 2022-10-15 VITALS — BP 128/86 | HR 86 | Wt 154.4 lb

## 2022-10-15 DIAGNOSIS — E221 Hyperprolactinemia: Secondary | ICD-10-CM

## 2022-10-15 NOTE — Patient Instructions (Signed)
Day,  I have ordered your MRI to be done at [facility]. Once this is approved by your insurance, they should call you to schedule it. Please come back and let us know if you develop new symptoms such as headache or vision changes.  Eliezer Mccoy, MD

## 2022-10-15 NOTE — Progress Notes (Unsigned)
    SUBJECTIVE:   CHIEF COMPLAINT / HPI:   Hyperprolactinemia Noted during recent pregnancy. Fasting prolactin levels were 80.1 and 116.0. Per OB notes, planned for MRI brain post-partum but this has not yet happened.   PERTINENT  PMH / PSH: ***  OBJECTIVE:   There were no vitals taken for this visit.  ***  ASSESSMENT/PLAN:   No problem-specific Assessment & Plan notes found for this encounter.     Eliezer Mccoy, MD [facility]

## 2022-10-16 ENCOUNTER — Ambulatory Visit (HOSPITAL_COMMUNITY): Payer: Self-pay

## 2022-10-17 NOTE — Assessment & Plan Note (Signed)
Has not had a brain MRI. Will need one as part of her workup. She is presently 3 months post-partum and breastfeeding, so obviously difficult to assess for symptoms.  - MRI Brain W WO Contrast

## 2022-10-30 ENCOUNTER — Encounter (HOSPITAL_COMMUNITY): Payer: Self-pay | Admitting: Emergency Medicine

## 2022-10-30 ENCOUNTER — Ambulatory Visit (HOSPITAL_COMMUNITY)
Admission: EM | Admit: 2022-10-30 | Discharge: 2022-10-30 | Disposition: A | Discharge status: Home / Self Care | Payer: Medicaid Other | Attending: Emergency Medicine | Admitting: Emergency Medicine

## 2022-10-30 DIAGNOSIS — Z113 Encounter for screening for infections with a predominantly sexual mode of transmission: Secondary | ICD-10-CM | POA: Insufficient documentation

## 2022-10-30 DIAGNOSIS — N898 Other specified noninflammatory disorders of vagina: Secondary | ICD-10-CM | POA: Insufficient documentation

## 2022-10-30 LAB — HIV ANTIBODY (ROUTINE TESTING W REFLEX): HIV Screen 4th Generation wRfx: NONREACTIVE

## 2022-10-30 LAB — POCT URINE PREGNANCY: Preg Test, Ur: NEGATIVE

## 2022-10-30 NOTE — ED Triage Notes (Signed)
Patient c/o vaginal discharge x 1 week, concern for STI.  Would like testing.  Denies any odor or color to discharge.

## 2022-10-30 NOTE — Discharge Instructions (Addendum)
We will call you if anything on your swab or blood work returns positive. You can also see these results on MyChart. Please abstain from sexual intercourse until your results return.

## 2022-10-30 NOTE — ED Provider Notes (Signed)
[facility]    CSN: [id number] Arrival date & time: 10/30/22  1707     History   Chief Complaint Chief Complaint  Patient presents with   Vaginal Discharge   HPI Madeline Wilkins is a 27 y.o. female.  Presents for STD testing 1 week history of vaginal discharge. No odor She is sexually active but denies known exposure. LMP unknown, gets Depo injections  For a few days was having some left lower abdominal discomfort. Intermittent, comes and goes. Non severe. No nausea or vomiting. 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   Postpartum care following vaginal delivery 07/08/2022   History of delivery of macrosomal infant 06/23/2022   Hyperprolactinemia (HCC) 07/20/2020    Past Surgical History:  Procedure Laterality Date   INDUCED ABORTION     WISDOM TOOTH EXTRACTION Bilateral    Bottom Teeth    OB History     Gravida  4   Para  2   Term  2   Preterm  0   AB  2   Living  2      SAB  0   IAB  2   Ectopic  0   Multiple  0   Live Births  2        Obstetric Comments  G2: mid December 2017 in office D&C (elective AB)          Home Medications    Prior to Admission medications   Medication Sig Start Date End Date Taking? Authorizing Provider  ibuprofen (ADVIL) 600 MG tablet Take 1 tablet (600 mg total) by mouth every 6 (six) hours. 06/24/22  Yes Weinhold, Lelon Mast C, CNM  medroxyPROGESTERone  (DEPO-PROVERA) 150 MG/ML injection Inject 150 mg into the muscle every 3 (three) months.   Yes [provider]  Prenatal Vit-Fe Fumarate-FA (PRENATAL VITAMIN) 27-0.8 MG TABS Take 1 tablet by mouth daily. 10/24/21  Yes Zenia Resides, MD  polyethylene glycol powder (GLYCOLAX/MIRALAX) 17 GM/SCOOP powder Take 17 g by mouth daily. Patient not taking: Reported on 07/03/2022 07/02/22   Celedonio Savage, MD    Family History Family History  Problem Relation Age of Onset   Healthy Mother    Healthy Father    Cancer Neg Hx    Diabetes Neg Hx    Heart disease Neg Hx    Hypertension Neg Hx    Stroke Neg Hx    Hearing loss Neg Hx    Asthma Neg Hx    Other Neg Hx        elev prolactin    Social History Social History   Tobacco Use   Smoking status: Never   Smokeless tobacco: Never  Vaping Use   Vaping status: Never Used  Substance Use Topics   Alcohol use: Not Currently   Drug use: No     Allergies   Shellfish allergy  Review of Systems Review of Systems  Genitourinary:  Positive for vaginal discharge.   Per HPI  Physical Exam Triage Vital Signs ED Triage Vitals  Encounter Vitals Group     BP 10/30/22 1758 115/73     Systolic BP Percentile --      Diastolic BP Percentile --      Pulse Rate 10/30/22 1758 81     Resp 10/30/22 1758 16     Temp 10/30/22 1758 98.3 F (36.8 C)     Temp Source 10/30/22 1758 Oral     SpO2 10/30/22 1758 94 %     Weight 10/30/22 1759 150 lb (68 kg)     Height 10/30/22 1759 5\' 8"  (1.727 m)     Head Circumference --      Peak Flow --      Pain Score 10/30/22 1759 0     Pain Loc --      Pain Education --      Exclude from Growth Chart --    No data found.  Updated Vital Signs BP 115/73 (BP Location: Right Arm)   Pulse 81   Temp 98.3 F (36.8 C) (Oral)   Resp 16   Ht 5\' 8"  (1.727 m)   Wt 150 lb (68 kg)   SpO2 94%   Breastfeeding Yes   BMI 22.81 kg/m   Physical Exam Vitals and nursing note reviewed.  Constitutional:       General: She is not in acute distress.    Appearance: Normal appearance.  Cardiovascular:     Rate and Rhythm: Normal rate and regular rhythm.     Heart sounds: Normal heart sounds.  Pulmonary:     Effort: Pulmonary effort is normal.     Breath sounds: Normal breath sounds.  Abdominal:     General: Abdomen is flat.     Palpations: Abdomen is soft.     Tenderness: There is no abdominal tenderness.  Neurological:     Mental Status: She is alert and oriented to person, place, and time.    UC Treatments / Results  Labs (all labs ordered are listed, but only abnormal results are displayed) Labs Reviewed  HIV ANTIBODY (ROUTINE TESTING W REFLEX)  RPR  POCT URINE PREGNANCY  CERVICOVAGINAL ANCILLARY ONLY    EKG  Radiology No results found.  Procedures Procedures  Medications Ordered in UC Medications - No data to display  Initial Impression / Assessment and Plan / UC Course  I have reviewed the triage vital signs and the nursing notes.  Pertinent labs & imaging results that were available during my care of the patient were reviewed by me and considered in my medical decision making (see chart for details).  UPT negative  Cytology swab is pending HIV/RPR pending as well Treat positive result as indicated No questions at this time Patient agrees to plan   Final Clinical Impressions(s) / UC Diagnoses   Final diagnoses:  Vaginal discharge  Screen for STD (sexually transmitted disease)     Discharge Instructions      We will call you if anything on your swab or blood work returns positive. You can also see these results on MyChart. Please abstain from sexual intercourse until your results return.     ED Prescriptions   None    PDMP not reviewed this encounter.   Marlow Baars, [place] 10/30/22 1859

## 2022-10-31 LAB — RPR: RPR Ser Ql: NONREACTIVE

## 2022-11-01 ENCOUNTER — Telehealth (HOSPITAL_COMMUNITY): Payer: Self-pay | Admitting: Emergency Medicine

## 2022-11-01 MED ORDER — METRONIDAZOLE 0.75 % VA GEL: 1.0000 | Freq: Every day | VAGINAL | 0 refills | Status: AC

## 2022-11-01 NOTE — Telephone Encounter (Signed)
Metrogel for positive Bv while breastfeeding

## 2022-11-07 LAB — CERVICOVAGINAL ANCILLARY ONLY
Bacterial Vaginitis (gardnerella): POSITIVE — AB
Candida Glabrata: NEGATIVE
Candida Vaginitis: NEGATIVE
Chlamydia: NEGATIVE
Comment: NEGATIVE
Comment: NEGATIVE
Comment: NEGATIVE
Comment: NEGATIVE
Comment: NEGATIVE
Comment: NORMAL
Neisseria Gonorrhea: NEGATIVE
Trichomonas: NEGATIVE

## 2022-11-08 ENCOUNTER — Other Ambulatory Visit: Payer: Medicaid Other

## 2022-11-14 ENCOUNTER — Encounter (HOSPITAL_COMMUNITY): Payer: Self-pay

## 2022-11-14 ENCOUNTER — Ambulatory Visit (HOSPITAL_COMMUNITY)
Admission: EM | Admit: 2022-11-14 | Discharge: 2022-11-14 | Disposition: A | Discharge status: Home / Self Care | Payer: Medicaid Other | Attending: Family Medicine | Admitting: Family Medicine

## 2022-11-14 DIAGNOSIS — J069 Acute upper respiratory infection, unspecified: Secondary | ICD-10-CM | POA: Diagnosis not present

## 2022-11-14 DIAGNOSIS — Z1152 Encounter for screening for COVID-19: Secondary | ICD-10-CM | POA: Diagnosis not present

## 2022-11-14 MED ORDER — PROMETHAZINE-DM 6.25-15 MG/5ML PO SYRP: 5.0000 mL | ORAL_SOLUTION | Freq: Four times a day (QID) | ORAL | 0 refills | Status: DC | PRN

## 2022-11-14 NOTE — Discharge Instructions (Signed)
You have been tested for COVID-19 today. °If your test returns positive, you will receive a phone call from [facility] regarding your results. °Negative test results are not called. °Both positive and negative results area always visible on MyChart. °If you do not have a MyChart account, sign up instructions are provided in your discharge papers. °Please do not hesitate to contact us should you have questions or concerns. ° °

## 2022-11-14 NOTE — ED Triage Notes (Signed)
Patient c/o cough,congestion and sore throat x 3 days.

## 2022-11-14 NOTE — ED Provider Notes (Signed)
[facility]   [id number] 11/14/22 Arrival Time: 1735  ASSESSMENT & PLAN:  1. Viral URI with cough    Discussed typical duration of likely viral illness. COVID testing sent at pt request. OTC symptom care as needed.  Meds ordered this encounter  Medications   promethazine-dextromethorphan (PROMETHAZINE-DM) 6.25-15 MG/5ML syrup    Sig: Take 5 mLs by mouth 4 (four) times daily as needed for cough.    Dispense:  118 mL    Refill:  0  Work note provided.  Labs Reviewed  SARS CORONAVIRUS 2 (TAT 6-24 HRS)      Follow-up Information     Glendale Chard, DO.   Specialty: Family Medicine Why: If worsening or failing to improve as anticipated. Contact information: [address] [place] [address] [phone]                 Reviewed expectations re: course of current medical issues. Questions answered. Outlined signs and symptoms indicating need for more acute intervention. Understanding verbalized. After Visit Summary given.   SUBJECTIVE: History from: Patient. Madeline Wilkins is a 27 y.o. female. Reports: cough, nasal congestion, ST; abrupt onset; x 3 days. Denies: fever and difficulty breathing. Normal PO intake without n/v/d. Children with same now.  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   Patient will need to be screened every 3 months 02/2015 nonreactive; exposed in  Summer 2016, former partner incarcerated March 2017 [x]  NR July 2017 [x]  NR   Sept 2017 [x]  NR   Infection    UTI   Kidney stone    Trichomonal infection    Urinary tract infection 02/23/2015   Social History   Socioeconomic History   Marital status: Single    Spouse name: Not on file   Number of children: Not on file   Years of education: Not on file   Highest education level: Not on file  Occupational History    Employer: [employer]  Tobacco Use   Smoking status: Never   Smokeless tobacco: Never  Vaping Use   Vaping status: Never Used  Substance and Sexual Activity   Alcohol use: Not Currently   Drug use: No   Sexual activity: Yes    Birth control/protection: None  Other Topics Concern   Not on file  Social History Narrative   Not on file   Social Determinants of Health   Financial Resource Strain: Not on file  Food Insecurity: No Food Insecurity (06/23/2022)   Hunger Vital Sign    Worried About Running Out of Food in the Last Year: Never true    Ran Out of Food in the Last Year: Never true  Transportation Needs: No Transportation Needs (06/23/2022)   PRAPARE - Administrator, Civil Service (Medical): No    Lack of Transportation (Non-Medical): No  Physical Activity: Not on file  Stress: Not on file  Social Connections: Not on file  Intimate Partner Violence: Not At Risk (06/23/2022)   Humiliation, Afraid, Rape, and Kick questionnaire    Fear of Current or Ex-Partner: No    Emotionally Abused: No    Physically Abused: No    Sexually Abused: No   Family History  Problem Relation Age of Onset   Healthy Mother    Healthy Father    Cancer Neg Hx    Diabetes Neg Hx    Heart disease Neg Hx    Hypertension Neg Hx    Stroke Neg Hx    Hearing loss Neg Hx    Asthma Neg Hx    Other Neg Hx        elev prolactin   Past Surgical History:  Procedure Laterality Date   INDUCED ABORTION     WISDOM TOOTH EXTRACTION Bilateral    Bottom Teeth      Mardella Layman, MD 11/14/22 1927

## 2022-11-15 LAB — SARS CORONAVIRUS 2 (TAT 6-24 HRS): SARS Coronavirus 2: NEGATIVE

## 2022-11-24 ENCOUNTER — Ambulatory Visit (HOSPITAL_COMMUNITY): Payer: Self-pay

## 2022-12-19 DIAGNOSIS — Z111 Encounter for screening for respiratory tuberculosis: Secondary | ICD-10-CM | POA: Diagnosis not present

## 2022-12-22 DIAGNOSIS — Z111 Encounter for screening for respiratory tuberculosis: Secondary | ICD-10-CM | POA: Diagnosis not present

## 2022-12-31 ENCOUNTER — Ambulatory Visit (HOSPITAL_COMMUNITY)
Admission: EM | Admit: 2022-12-31 | Discharge: 2022-12-31 | Disposition: A | Discharge status: Home / Self Care | Payer: Medicaid Other | Attending: Internal Medicine | Admitting: Internal Medicine

## 2022-12-31 ENCOUNTER — Encounter (HOSPITAL_COMMUNITY): Payer: Self-pay

## 2022-12-31 DIAGNOSIS — Z113 Encounter for screening for infections with a predominantly sexual mode of transmission: Secondary | ICD-10-CM

## 2022-12-31 LAB — POCT URINALYSIS DIP (MANUAL ENTRY)
Bilirubin, UA: NEGATIVE
Blood, UA: NEGATIVE
Glucose, UA: NEGATIVE mg/dL
Ketones, POC UA: NEGATIVE mg/dL
Leukocytes, UA: NEGATIVE
Nitrite, UA: NEGATIVE
Protein Ur, POC: NEGATIVE mg/dL
Spec Grav, UA: 1.025 (ref 1.010–1.025)
Urobilinogen, UA: 0.2 U/dL
pH, UA: 5.5 (ref 5.0–8.0)

## 2022-12-31 LAB — HIV ANTIBODY (ROUTINE TESTING W REFLEX): HIV Screen 4th Generation wRfx: NONREACTIVE

## 2022-12-31 LAB — POCT URINE PREGNANCY: Preg Test, Ur: NEGATIVE

## 2022-12-31 NOTE — ED Triage Notes (Signed)
Pt is wanting to be tested for STD's at this time. Pt reports "my partner is sexually active with other women and has unprotected sex. I started to have white vaginal discharge one week ago with some pain in my abdomen, but the pain has now resolved." Pt denies taking medications for symptoms. Pt also denies dysuria/pain with urination.

## 2022-12-31 NOTE — ED Provider Notes (Signed)
[facility]    CSN: [id number] Arrival date & time: 12/31/22  0840      History   Chief Complaint Chief Complaint  Patient presents with   STD Testing     HPI [name] is a 27 y.o. female.   27 year old female who presents to urgent care with complaints of vaginal pain, vaginal discharge and flank pain.  She reports that this started about a week and a half ago.  She is concerned as she found out that her partner had been sleeping around with other women and she is concerned that she may have contracted a STD.  She is also concerned for urinary tract infection.  She is using Depo but would like to be sure that she is not pregnant.  She denies any fevers or chills.  The vaginal discharge is whitish colored and very thick.     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   Postpartum care following vaginal delivery 07/08/2022   History of delivery of macrosomal infant 06/23/2022   Hyperprolactinemia (HCC) 07/20/2020    Past Surgical History:  Procedure Laterality Date   INDUCED ABORTION     WISDOM TOOTH EXTRACTION Bilateral    Bottom Teeth    OB History     Gravida  4   Para  2   Term  2   Preterm  0   AB  2   Living  2      SAB  0   IAB  2   Ectopic  0   Multiple  0   Live Births  2        Obstetric Comments  G2: mid December 2017 in office D&C (elective AB)          Home Medications    Prior to Admission medications    Medication Sig Start Date End Date Taking? Authorizing Provider  ibuprofen (ADVIL) 600 MG tablet Take 1 tablet (600 mg total) by mouth every 6 (six) hours. 06/24/22   Calvert Cantor, CNM  medroxyPROGESTERone (DEPO-PROVERA) 150 MG/ML injection Inject 150 mg into the muscle every 3 (three) months.    [provider]  polyethylene glycol powder (GLYCOLAX/MIRALAX) 17 GM/SCOOP powder Take 17 g by mouth daily. 07/02/22   Celedonio Savage, MD  Prenatal Vit-Fe Fumarate-FA (PRENATAL VITAMIN) 27-0.8 MG TABS Take 1 tablet by mouth daily. 10/24/21   Zenia Resides, MD  promethazine-dextromethorphan (PROMETHAZINE-DM) 6.25-15 MG/5ML syrup Take 5 mLs by mouth 4 (four) times daily as needed for cough. 11/14/22   Mardella Layman, MD    Family History Family History  Problem Relation Age of Onset   Healthy Mother    Healthy Father    Cancer Neg Hx    Diabetes Neg Hx    Heart disease Neg Hx    Hypertension Neg Hx    Stroke Neg Hx    Hearing loss Neg Hx  Asthma Neg Hx    Other Neg Hx        elev prolactin    Social History Social History   Tobacco Use   Smoking status: Never   Smokeless tobacco: Never  Vaping Use   Vaping status: Never Used  Substance Use Topics   Alcohol use: Not Currently   Drug use: No     Allergies   Shellfish allergy   Review of Systems Review of Systems  Constitutional:  Negative for chills and fever.  HENT:  Negative for ear pain and sore throat.   Eyes:  Negative for pain and visual disturbance.  Respiratory:  Negative for cough and shortness of breath.   Cardiovascular:  Negative for chest pain and palpitations.  Gastrointestinal:  Negative for abdominal pain and vomiting.  Genitourinary:  Positive for flank pain, vaginal discharge and vaginal pain. Negative for difficulty urinating, dysuria and hematuria.  Musculoskeletal:  Negative for arthralgias and back pain.  Skin:  Negative for color change and rash.  Neurological:  Negative for  seizures and syncope.  All other systems reviewed and are negative.    Physical Exam Triage Vital Signs ED Triage Vitals  Encounter Vitals Group     BP 12/31/22 0942 115/73     Systolic BP Percentile --      Diastolic BP Percentile --      Pulse Rate 12/31/22 0942 85     Resp 12/31/22 0942 18     Temp 12/31/22 0942 98.2 F (36.8 C)     Temp Source 12/31/22 0942 Oral     SpO2 12/31/22 0942 94 %     Weight --      Height --      Head Circumference --      Peak Flow --      Pain Score 12/31/22 0943 0     Pain Loc --      Pain Education --      Exclude from Growth Chart --    No data found.  Updated Vital Signs BP 115/73 (BP Location: Right Arm)   Pulse 85   Temp 98.2 F (36.8 C) (Oral)   Resp 18   LMP 10/27/2022 (Approximate) Comment: pt reports having depo injections, however did have a period in August.  SpO2 94%   Breastfeeding Yes   Visual Acuity Right Eye Distance:   Left Eye Distance:   Bilateral Distance:    Right Eye Near:   Left Eye Near:    Bilateral Near:     Physical Exam Vitals and nursing note reviewed.  Constitutional:      General: She is not in acute distress.    Appearance: She is well-developed.  HENT:     Head: Normocephalic and atraumatic.  Eyes:     Conjunctiva/sclera: Conjunctivae normal.  Cardiovascular:     Rate and Rhythm: Normal rate and regular rhythm.     Heart sounds: No murmur heard. Pulmonary:     Effort: Pulmonary effort is normal. No respiratory distress.     Breath sounds: Normal breath sounds.  Abdominal:     Palpations: Abdomen is soft.     Tenderness: There is no abdominal tenderness.  Musculoskeletal:        General: No swelling.     Cervical back: Neck supple.  Skin:    General: Skin is warm and dry.     Capillary Refill: Capillary refill takes less than 2 seconds.  Neurological:     Mental Status: She  is alert.  Psychiatric:        Mood and Affect: Mood normal.      UC Treatments / Results   Labs (all labs ordered are listed, but only abnormal results are displayed) Labs Reviewed - No data to display  EKG   Radiology No results found.  Procedures Procedures (including critical care time)  Medications Ordered in UC Medications - No data to display  Initial Impression / Assessment and Plan / UC Course  I have reviewed the triage vital signs and the nursing notes.  Pertinent labs & imaging results that were available during my care of the patient were reviewed by me and considered in my medical decision making (see chart for details).     Screening examination for STI   Urinalysis is negative for infection Urine pregnancy was negative  Screening swab and lab work done today and results will be available in 24-28 hours. We will contact you if we need to arrange additional treatment based on your testing. Negative results will be on your MyChart account. Abstain from sex until you receive your final results.  Use a condom for sexual encounters. If you have any worsening or changing symptoms including abnormal discharge, pelvic pain, abdominal pain, fever, nausea, or vomiting, then you should be reevaluated.   Final Clinical Impressions(s) / UC Diagnoses   Final diagnoses:  Screening examination for STI     Discharge Instructions      Screening swab done today and results will be available in 24-28 hours. We will contact you if we need to arrange additional treatment based on your testing. Negative results will be on your MyChart account. Abstain from sex until you receive your final results.  Use a condom for sexual encounters. If you have any worsening or changing symptoms including abnormal discharge, pelvic pain, abdominal pain, fever, nausea, or vomiting, then you should be reevaluated.     ED Prescriptions   None    PDMP not reviewed this encounter.   Landis Martins, [place] 12/31/22 1034

## 2022-12-31 NOTE — Discharge Instructions (Addendum)
Urinalysis is negative for infection Urine pregnancy was negative  Screening swab done today and results will be available in 24-28 hours. We will contact you if we need to arrange additional treatment based on your testing. Negative results will be on your MyChart account. Abstain from sex until you receive your final results.  Use a condom for sexual encounters. If you have any worsening or changing symptoms including abnormal discharge, pelvic pain, abdominal pain, fever, nausea, or vomiting, then you should be reevaluated.

## 2022-12-31 NOTE — ED Notes (Addendum)
Pt currently reports she received a depo injection after birth of daughter. LMP reported to be 10/27/22. Pt reports "I am not going to get that injection anymore."

## 2023-01-01 LAB — CERVICOVAGINAL ANCILLARY ONLY
Bacterial Vaginitis (gardnerella): POSITIVE — AB
Candida Glabrata: NEGATIVE
Candida Vaginitis: NEGATIVE
Chlamydia: NEGATIVE
Comment: NEGATIVE
Comment: NEGATIVE
Comment: NEGATIVE
Comment: NEGATIVE
Comment: NEGATIVE
Comment: NORMAL
Neisseria Gonorrhea: NEGATIVE
Trichomonas: NEGATIVE

## 2023-01-01 LAB — RPR: RPR Ser Ql: NONREACTIVE

## 2023-01-02 ENCOUNTER — Telehealth: Payer: Self-pay

## 2023-01-02 NOTE — Telephone Encounter (Signed)
Created in error

## 2023-01-07 ENCOUNTER — Telehealth: Payer: Self-pay

## 2023-01-07 MED ORDER — METRONIDAZOLE 0.75 % VA GEL: 1.0000 | Freq: Every day | VAGINAL | 0 refills | Status: AC

## 2023-01-07 NOTE — Telephone Encounter (Signed)
Pt returned call. Per protocol, pt requires tx with metronidazole. Reviewed with patient, verified pharmacy, prescription sent.

## 2023-01-17 ENCOUNTER — Ambulatory Visit (HOSPITAL_COMMUNITY): Payer: Medicaid Other

## 2023-01-17 ENCOUNTER — Ambulatory Visit (HOSPITAL_COMMUNITY)
Admission: RE | Admit: 2023-01-17 | Discharge: 2023-01-17 | Disposition: A | Discharge status: Home / Self Care | Payer: Medicaid Other | Source: Ambulatory Visit | Attending: Emergency Medicine | Admitting: Emergency Medicine

## 2023-01-17 VITALS — BP 132/85 | HR 91 | Temp 98.5°F | Resp 16 | Ht 68.0 in | Wt 172.0 lb

## 2023-01-17 DIAGNOSIS — N760 Acute vaginitis: Secondary | ICD-10-CM | POA: Insufficient documentation

## 2023-01-17 DIAGNOSIS — B9689 Other specified bacterial agents as the cause of diseases classified elsewhere: Secondary | ICD-10-CM | POA: Diagnosis not present

## 2023-01-17 DIAGNOSIS — N898 Other specified noninflammatory disorders of vagina: Secondary | ICD-10-CM | POA: Diagnosis not present

## 2023-01-17 DIAGNOSIS — Z113 Encounter for screening for infections with a predominantly sexual mode of transmission: Secondary | ICD-10-CM | POA: Insufficient documentation

## 2023-01-17 MED ORDER — METRONIDAZOLE 1 % EX GEL: Freq: Every evening | CUTANEOUS | 0 refills | Status: DC

## 2023-01-17 MED ORDER — METRONIDAZOLE 0.75 % VA GEL: 1.0000 | Freq: Every evening | VAGINAL | 0 refills | Status: AC

## 2023-01-17 NOTE — ED Triage Notes (Signed)
Patient c/o vaginal discharge and odor.  Patient would like to have STI screening after having unprotected sex.  Patient was recently diagnosed with "BV" but did not pick up medication due to not having a pharmacy on file.

## 2023-01-17 NOTE — ED Provider Notes (Signed)
[facility]    CSN: [id number] Arrival date & time: 01/17/23  1056     History   Chief Complaint Chief Complaint  Patient presents with   SEXUALLY TRANSMITTED DISEASE    Entered by patient    HPI Madeline Wilkins is a 27 y.o. female.  Here for STD testing Reports recent unprotected intercourse. No known exposure Vaginal discharge with odor for several weeks She was seen 10/29 and had positive BV Did not start medication. 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   Postpartum care following vaginal delivery 07/08/2022   History of delivery of macrosomal infant 06/23/2022   Hyperprolactinemia (HCC) 07/20/2020    Past Surgical History:  Procedure Laterality Date   INDUCED ABORTION     WISDOM TOOTH EXTRACTION Bilateral    Bottom Teeth    OB History     Gravida  4   Para  2   Term  2   Preterm  0   AB  2   Living  2      SAB  0   IAB  2   Ectopic  0   Multiple  0   Live Births  2        Obstetric Comments  G2: mid December 2017 in office D&C (elective AB)          Home Medications    Prior to Admission medications   Medication Sig Start Date End Date Taking? Authorizing Provider  metroNIDAZOLE (METROGEL) 0.75 % vaginal gel Place 1 Applicatorful vaginally at bedtime for 5 days. 01/17/23 01/22/23 Yes Delores Thelen, Lurena Joiner, PA-C    Family History Family History   Problem Relation Age of Onset   Healthy Mother    Healthy Father    Cancer Neg Hx    Diabetes Neg Hx    Heart disease Neg Hx    Hypertension Neg Hx    Stroke Neg Hx    Hearing loss Neg Hx    Asthma Neg Hx    Other Neg Hx        elev prolactin    Social History Social History   Tobacco Use   Smoking status: Never   Smokeless tobacco: Never  Vaping Use   Vaping status: Never Used  Substance Use Topics   Alcohol use: Not Currently   Drug use: No     Allergies   Shellfish allergy   Review of Systems Review of Systems Per HPI  Physical Exam Triage Vital Signs ED Triage Vitals  Encounter Vitals Group     BP 01/17/23 1112 132/85     Systolic BP Percentile --      Diastolic BP Percentile --      Pulse Rate 01/17/23 1112 91     Resp 01/17/23 1112 16     Temp 01/17/23 1112 98.5  F (36.9 C)     Temp Source 01/17/23 1112 Oral     SpO2 01/17/23 1112 97 %     Weight 01/17/23 1115 172 lb (78 kg)     Height 01/17/23 1114 5\' 8"  (1.727 m)     Head Circumference --      Peak Flow --      Pain Score 01/17/23 1114 0     Pain Loc --      Pain Education --      Exclude from Growth Chart --    No data found.  Updated Vital Signs BP 132/85 (BP Location: Right Arm)   Pulse 91   Temp 98.5 F (36.9 C) (Oral)   Resp 16   Ht 5\' 8"  (1.727 m)   Wt 172 lb (78 kg)   LMP 01/15/2023 (Approximate) Comment: pt reports having depo injections, however did have a period in August.  SpO2 97%   Breastfeeding Yes   BMI 26.15 kg/m    Physical Exam Vitals and nursing note reviewed.  Constitutional:      General: She is not in acute distress.    Appearance: Normal appearance.  Cardiovascular:     Rate and Rhythm: Normal rate and regular rhythm.     Heart sounds: Normal heart sounds.  Pulmonary:     Effort: Pulmonary effort is normal.     Breath sounds: Normal breath sounds.  Abdominal:     Tenderness: There is no abdominal tenderness.  Neurological:     Mental Status: She  is alert and oriented to person, place, and time.     UC Treatments / Results  Labs (all labs ordered are listed, but only abnormal results are displayed) Labs Reviewed  CERVICOVAGINAL ANCILLARY ONLY    EKG  Radiology No results found.  Procedures Procedures (including critical care time)  Medications Ordered in UC Medications - No data to display  Initial Impression / Assessment and Plan / UC Course  I have reviewed the triage vital signs and the nursing notes.  Pertinent labs & imaging results that were available during my care of the patient were reviewed by me and considered in my medical decision making (see chart for details).  Cytology swab pending +BV 2 weeks ago, sent metrogel to use nightly x 5  Final Clinical Impressions(s) / UC Diagnoses   Final diagnoses:  Vaginal discharge  BV (bacterial vaginosis)  Screen for STD (sexually transmitted disease)     Discharge Instructions      We will call you if anything on your swab returns positive. You can also see these results on MyChart. Please abstain from sexual intercourse until your results return.  2 weeks ago you were positive for BV. This is not a sexually transmitted infection but can cause symptoms of discharge and odor. I am sending you the medication to treat this (Metrogel) You can ask for a GoodRx coupon at the pharmacy      ED Prescriptions     Medication Sig Dispense Auth. Provider   metroNIDAZOLE (METROGEL) 1 % gel  (Status: Discontinued) Apply topically at bedtime for 5 days. 45 g Shandra Szymborski, PA-C   metroNIDAZOLE (METROGEL) 0.75 % vaginal gel Place 1 Applicatorful vaginally at bedtime for 5 days. 70 g Nahal Wanless, Lurena Joiner, PA-C      PDMP not reviewed this encounter.   Marlow Baars, [place] 01/17/23 1159

## 2023-01-17 NOTE — Discharge Instructions (Addendum)
We will call you if anything on your swab returns positive. You can also see these results on MyChart. Please abstain from sexual intercourse until your results return.  2 weeks ago you were positive for BV. This is not a sexually transmitted infection but can cause symptoms of discharge and odor. I am sending you the medication to treat this (Metrogel) You can ask for a GoodRx coupon at the pharmacy

## 2023-01-20 LAB — CERVICOVAGINAL ANCILLARY ONLY
Chlamydia: NEGATIVE
Comment: NEGATIVE
Comment: NEGATIVE
Comment: NORMAL
Neisseria Gonorrhea: NEGATIVE
Trichomonas: NEGATIVE

## 2023-02-03 ENCOUNTER — Ambulatory Visit: Payer: Medicaid Other | Admitting: Student

## 2023-02-03 NOTE — Progress Notes (Unsigned)
    SUBJECTIVE:   CHIEF COMPLAINT / HPI: Obtain pap smear  Pap Smear: Patient is a 27 y.o. female presenting for pap smear.  She states she does not have any discharge, odor or vaginal symptosm.  She is not *** interested in screening for sexually transmitted infections today. She is on contraception with ***  PERTINENT  PMH / PSH: ***None relevant  OBJECTIVE:   LMP 01/15/2023 (Approximate) Comment: pt reports having depo injections, however did have a period in August.   General: NAD, pleasant, able to participate in exam Respiratory: Normal effort, no obvious respiratory distress Pelvic: VULVA: normal appearing vulva with no masses, tenderness or lesions, VAGINA: Normal appearing vagina with normal color, no lesions, with {GYN VAGINAL DISCHARGE:21986} discharge present, ***CERVIX: No lesions, {GYN VAGINAL DISCHARGE:21986} discharge present. Pap smear performed with cytobrush and spatula  Chaperone *** CMA present for pelvic exam  ASSESSMENT/PLAN:   No problem-specific Assessment & Plan notes found for this encounter.    Assessment:  27 y.o. female here for pap smear.  Physical exam significant for*** discharge.  Patient is not*** interested in STI screening.   Plan: -F/u pap smear results -Gonnorhea, chlamydia *** -RPR, HIV *** -Follow-up as needed  Levin Erp, MD [facility]

## 2023-02-07 ENCOUNTER — Ambulatory Visit: Payer: Medicaid Other | Admitting: Family Medicine

## 2023-02-07 NOTE — Progress Notes (Unsigned)
    SUBJECTIVE:   CHIEF COMPLAINT / HPI: Obtain pap smear  Pap Smear: Patient is a 27 y.o. female presenting for pap smear.  She states she does not have any discharge, odor or vaginal symptosm.  She is not *** interested in screening for sexually transmitted infections today. She is on contraception with ***  PERTINENT  PMH / PSH:  None relevant  OBJECTIVE:   LMP 01/15/2023 (Approximate) Comment: pt reports having depo injections, however did have a period in August.   General: NAD, pleasant, able to participate in exam Respiratory: Normal effort, no obvious respiratory distress Pelvic: VULVA: normal appearing vulva with no masses, tenderness or lesions, VAGINA: Normal appearing vagina with normal color, no lesions, with {GYN VAGINAL DISCHARGE:21986} discharge present, ***CERVIX: No lesions, {GYN VAGINAL DISCHARGE:21986} discharge present. Pap smear performed with cytobrush and spatula  Chaperone *** CMA present for pelvic exam  ASSESSMENT/PLAN:   No problem-specific Assessment & Plan notes found for this encounter.    Assessment:  27 y.o. female here for pap smear.  Physical exam significant for*** discharge.  Patient is not*** interested in STI screening.   Plan: -F/u pap smear results -Gonnorhea, chlamydia *** -RPR, HIV *** -Follow-up as needed  Cyndia Skeeters, DO [facility]

## 2023-02-13 ENCOUNTER — Ambulatory Visit: Payer: Medicaid Other | Admitting: Family Medicine

## 2023-02-13 ENCOUNTER — Other Ambulatory Visit (HOSPITAL_COMMUNITY)
Admission: RE | Admit: 2023-02-13 | Discharge: 2023-02-13 | Disposition: A | Discharge status: Home / Self Care | Payer: Medicaid Other | Source: Ambulatory Visit | Attending: Family Medicine | Admitting: Family Medicine

## 2023-02-13 ENCOUNTER — Encounter: Payer: Self-pay | Admitting: Family Medicine

## 2023-02-13 VITALS — BP 119/81 | HR 92 | Ht 68.5 in | Wt 172.2 lb

## 2023-02-13 DIAGNOSIS — Z3009 Encounter for other general counseling and advice on contraception: Secondary | ICD-10-CM

## 2023-02-13 DIAGNOSIS — N898 Other specified noninflammatory disorders of vagina: Secondary | ICD-10-CM | POA: Diagnosis not present

## 2023-02-13 LAB — POCT WET PREP (WET MOUNT)
Clue Cells Wet Prep Whiff POC: POSITIVE
Trichomonas Wet Prep HPF POC: ABSENT

## 2023-02-13 LAB — POCT URINE PREGNANCY: Preg Test, Ur: NEGATIVE

## 2023-02-13 MED ORDER — METRONIDAZOLE 500 MG PO TABS: 500.0000 mg | ORAL_TABLET | Freq: Two times a day (BID) | ORAL | 0 refills | Status: AC

## 2023-02-13 NOTE — Patient Instructions (Addendum)
Good to see you today - Thank you for coming in  Things we discussed today:  1) We will check your pap smear, STI testing, and urine pregnancy test today. - You are positive for BV. Take Metronidazole 500mg  twice a day for 7 days.  - I will call you any abnormal results.  If they are all normal, I will just update you via MyChart.  2) We will not give depo today.  - Avoid sexual intercourse for 1 week and then come back in 1 week for a repeat urine pregnancy test and then depo shot.

## 2023-02-13 NOTE — Progress Notes (Signed)
    SUBJECTIVE:   CHIEF COMPLAINT / HPI:   DM is a 27yo F previously on Depo that presents for STI screening and pregnancy screening - missed October depo shot - Has been sexually active since then, not using condoms - Gets irregular periods, but has had periods since October.  - Had abdominal pain, and worried about chlamydia. This happened briefly last night.  - Reports increase vaginal discharge for past week as well.  - Denies dysuria, fevers. -She is in monogamous relationship.  But reports that partner had previously given her chlamydia, and she is concerned.  OBJECTIVE:   BP 119/81   Pulse 92   Ht 5' 8.5" (1.74 m)   Wt 172 lb 3.2 oz (78.1 kg)   LMP 01/15/2023 (Approximate) Comment: pt reports having depo injections, however did have a period in August.  SpO2 97%   BMI 25.80 kg/m   General: Alert, pleasant woman. NAD. HEENT: NCAT. MMM. CV: RRR, no murmurs.  Resp: CTAB, no wheezing or crackles. Normal WOB on RA.  Abm: Soft, nontender, nondistended. BS present. Ext: Moves all ext spontaneously Skin: Warm, well perfused  GU: Thicker white discharge in vaginal vault with scant blood.  Chaperone was [employer].   ASSESSMENT/PLAN:   Assessment & Plan Vaginal discharge Most likely BV or yeast given appearance of discharge. Will also check for STI's including GC, Ch, trich, HIV, RPR. Birth control counseling Missed October Depo shot, and has been sexually active since.  Offered to give Depo shot today, and check urine preg today and repeat in 1-2 weeks. Discussed that depo is safe even if she is pregnanct, but pt would rather know for sure she is not pregnant before getting the shot.  -Check urine pregnancy today, advised to use consistent birth control method such as condoms or to abstain from sex for next week, then return in 1 week for repeat Upreg and potential Depo shot.    Lincoln Brigham, MD [facility]

## 2023-02-13 NOTE — Progress Notes (Deleted)
    SUBJECTIVE:   CHIEF COMPLAINT / HPI:   ***  - Prior pap 2021, normal.     pap  PERTINENT  PMH / PSH: ***  OBJECTIVE:   LMP 01/15/2023 (Approximate) Comment: pt reports having depo injections, however did have a period in August.  ***  ASSESSMENT/PLAN:   No problem-specific Assessment & Plan notes found for this encounter.     Lincoln Brigham, MD [facility]

## 2023-02-14 LAB — CERVICOVAGINAL ANCILLARY ONLY
Chlamydia: NEGATIVE
Comment: NEGATIVE
Comment: NEGATIVE
Comment: NORMAL
Neisseria Gonorrhea: NEGATIVE
Trichomonas: NEGATIVE

## 2023-02-14 LAB — RPR: RPR Ser Ql: NONREACTIVE

## 2023-02-14 LAB — CYTOLOGY - PAP: Diagnosis: NEGATIVE

## 2023-02-14 LAB — HIV ANTIBODY (ROUTINE TESTING W REFLEX): HIV Screen 4th Generation wRfx: NONREACTIVE

## 2023-02-24 ENCOUNTER — Ambulatory Visit: Payer: Self-pay | Admitting: Student

## 2023-03-30 ENCOUNTER — Encounter (HOSPITAL_COMMUNITY): Payer: Self-pay

## 2023-03-30 ENCOUNTER — Ambulatory Visit (HOSPITAL_COMMUNITY)
Admission: EM | Admit: 2023-03-30 | Discharge: 2023-03-30 | Disposition: A | Discharge status: Home / Self Care | Payer: Medicaid Other | Attending: Internal Medicine | Admitting: Internal Medicine

## 2023-03-30 DIAGNOSIS — N898 Other specified noninflammatory disorders of vagina: Secondary | ICD-10-CM | POA: Insufficient documentation

## 2023-03-30 DIAGNOSIS — K529 Noninfective gastroenteritis and colitis, unspecified: Secondary | ICD-10-CM | POA: Insufficient documentation

## 2023-03-30 LAB — POCT URINE PREGNANCY: Preg Test, Ur: NEGATIVE

## 2023-03-30 MED ORDER — ONDANSETRON 4 MG PO TBDP: 4.0000 mg | ORAL_TABLET | Freq: Three times a day (TID) | ORAL | 0 refills | Status: DC | PRN

## 2023-03-30 MED ORDER — METRONIDAZOLE 1 % EX GEL
Freq: Every day | CUTANEOUS | 1 refills | Status: DC
Start: 2023-03-30 — End: 2023-07-31

## 2023-03-30 NOTE — ED Triage Notes (Signed)
Mother reports that she has had diarrhea and body aches since yesterday.  Patient is requesting a urine pregnancy test and  STD test. Patient states she is having clear white vaginal discharge.

## 2023-03-30 NOTE — ED Provider Notes (Signed)
[facility]    CSN: [id number] Arrival date & time: 03/30/23  1622      History   Chief Complaint Chief Complaint  Patient presents with   Diarrhea   Generalized Body Aches   STD testing   Possible Pregnancy    HPI Madeline Wilkins is a 28 y.o. female.   28 year old female who presents to urgent care with complaints of nausea, diarrhea and bodyaches.  She also is having a clear whitish vaginal discharge.  She reports that the nausea diarrhea and bodyaches started yesterday.  She missed work secondary to this.  She denies fevers or chills.  Her daughter has similar symptoms.  She reports the nausea is gone today but she is still only able to take an liquids.  She feels better overall today.  She did want to be tested for pregnancy due to her symptoms.  She also would like to have STI testing done.   Diarrhea Associated symptoms: no abdominal pain, no arthralgias, no chills, no fever and no vomiting   Possible Pregnancy Pertinent negatives include no chest pain, no abdominal pain and no shortness of breath.    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   Postpartum care following vaginal delivery 07/08/2022   History of delivery of macrosomal infant 06/23/2022   Hyperprolactinemia (HCC) 07/20/2020    Past Surgical History:  Procedure Laterality Date   INDUCED ABORTION     WISDOM TOOTH EXTRACTION Bilateral    Bottom Teeth    OB History     Gravida  4   Para  2    Term  2   Preterm  0   AB  2   Living  2      SAB  0   IAB  2   Ectopic  0   Multiple  0   Live Births  2        Obstetric Comments  G2: mid December 2017 in office D&C (elective AB)          Home Medications    Prior to Admission medications   Medication Sig Start Date End Date Taking? Authorizing Provider  metroNIDAZOLE (METROGEL) 1 % gel Apply topically daily. For 5 days 03/30/23  Yes Landis Martins, PA-C  ondansetron (ZOFRAN-ODT) 4 MG disintegrating tablet Take 1 tablet (4 mg total) by mouth every 8 (eight) hours as needed for nausea or vomiting. 03/30/23  Yes Landis Martins, PA-C    Family History Family History  Problem Relation Age of Onset   Healthy Mother    Healthy Father    Cancer Neg Hx    Diabetes Neg Hx    Heart disease Neg Hx    Hypertension Neg Hx    Stroke Neg Hx    Hearing loss Neg Hx    Asthma Neg Hx    Other Neg Hx        elev prolactin  Social History Social History   Tobacco Use   Smoking status: Never   Smokeless tobacco: Never  Vaping Use   Vaping status: Never Used  Substance Use Topics   Alcohol use: Not Currently   Drug use: No     Allergies   Shellfish allergy   Review of Systems Review of Systems  Constitutional:  Negative for chills and fever.  HENT:  Negative for ear pain and sore throat.   Eyes:  Negative for pain and visual disturbance.  Respiratory:  Negative for cough and shortness of breath.   Cardiovascular:  Negative for chest pain and palpitations.  Gastrointestinal:  Positive for diarrhea and nausea. Negative for abdominal pain and vomiting.  Genitourinary:  Negative for dysuria and hematuria.  Musculoskeletal:  Negative for arthralgias and back pain.       Generalized body aches  Skin:  Negative for color change and rash.  Neurological:  Negative for seizures and syncope.  All other systems reviewed and are negative.    Physical Exam Triage Vital Signs ED Triage Vitals   Encounter Vitals Group     BP 03/30/23 1742 113/75     Systolic BP Percentile --      Diastolic BP Percentile --      Pulse Rate 03/30/23 1742 90     Resp 03/30/23 1742 14     Temp 03/30/23 1742 (!) 97.4 F (36.3 C)     Temp Source 03/30/23 1742 Oral     SpO2 03/30/23 1742 96 %     Weight --      Height --      Head Circumference --      Peak Flow --      Pain Score 03/30/23 1743 5     Pain Loc --      Pain Education --      Exclude from Growth Chart --    No data found.  Updated Vital Signs BP 113/75 (BP Location: Left Arm)   Pulse 90   Temp (!) 97.4 F (36.3 C) (Oral)   Resp 14   LMP 12/25/2022 (Approximate) Comment: patienat states she stopped getting depo 2 months ago.  SpO2 96%   Visual Acuity Right Eye Distance:   Left Eye Distance:   Bilateral Distance:    Right Eye Near:   Left Eye Near:    Bilateral Near:     Physical Exam Vitals and nursing note reviewed.  Constitutional:      General: She is not in acute distress.    Appearance: She is well-developed.  HENT:     Head: Normocephalic and atraumatic.  Eyes:     Conjunctiva/sclera: Conjunctivae normal.  Cardiovascular:     Rate and Rhythm: Normal rate and regular rhythm.     Heart sounds: No murmur heard. Pulmonary:     Effort: Pulmonary effort is normal. No respiratory distress.     Breath sounds: Normal breath sounds.  Abdominal:     Palpations: Abdomen is soft.     Tenderness: There is no abdominal tenderness.  Musculoskeletal:        General: No swelling.     Cervical back: Neck supple.  Skin:    General: Skin is warm and dry.     Capillary Refill: Capillary refill takes less than 2 seconds.  Neurological:     Mental Status: She is alert.  Psychiatric:        Mood and Affect: Mood normal.      UC Treatments /  Results  Labs (all labs ordered are listed, but only abnormal results are displayed) Labs Reviewed  POCT URINE PREGNANCY  CERVICOVAGINAL ANCILLARY ONLY     EKG   Radiology No results found.  Procedures Procedures (including critical care time)  Medications Ordered in UC Medications - No data to display  Initial Impression / Assessment and Plan / UC Course  I have reviewed the triage vital signs and the nursing notes.  Pertinent labs & imaging results that were available during my care of the patient were reviewed by me and considered in my medical decision making (see chart for details).     Gastroenteritis  Vaginal discharge  Symptoms consistent with a stomach virus but this is likely already resolving. This does not require antibiotics. Recommend continuing to stay hydrated and slowly increase your diet to solid foods that are bland. May use zofran if needed for nausea.   Pregnancy test is negative.   We are treating you for bacterial vaginosis.  Please use metrogel daily for 5 days. We will contact you if we need to arrange additional treatment based on your testing. Abstain from sex until you receive your final results.  Use a condom for the sexual encounter.  Use hypoallergenic soaps and detergents and wear loosefitting cotton underwear.  If you have any worsening or changing symptoms including abnormal discharge, pelvic pain, abdominal pain, fever, nausea, vomiting you should be reevaluated.    Final Clinical Impressions(s) / UC Diagnoses   Final diagnoses:  Gastroenteritis  Vaginal discharge     Discharge Instructions      Symptoms consistent with a stomach virus but this is likely already resolving. This does not require antibiotics. Recommend continuing to stay hydrated and slowly increase your diet to solid foods that are bland. May use zofran if needed for nausea.   Pregnancy test is negative.   We are treating you for bacterial vaginosis.  Please use metrogel daily for 5 days. We will contact you if we need to arrange additional treatment based on your testing. Abstain from sex until you receive your final  results.  Use a condom for the sexual encounter.  Use hypoallergenic soaps and detergents and wear loosefitting cotton underwear.  If you have any worsening or changing symptoms including abnormal discharge, pelvic pain, abdominal pain, fever, nausea, vomiting you should be reevaluated.    ED Prescriptions     Medication Sig Dispense Auth. Provider   metroNIDAZOLE (METROGEL) 1 % gel Apply topically daily. For 5 days 45 g Zakye Baby A, PA-C   ondansetron (ZOFRAN-ODT) 4 MG disintegrating tablet Take 1 tablet (4 mg total) by mouth every 8 (eight) hours as needed for nausea or vomiting. 20 tablet Landis Martins, [place]      PDMP not reviewed this encounter.   Landis Martins, [place] 03/30/23 1813

## 2023-03-30 NOTE — Discharge Instructions (Addendum)
Symptoms consistent with a stomach virus but this is likely already resolving. This does not require antibiotics. Recommend continuing to stay hydrated and slowly increase your diet to solid foods that are bland. May use zofran if needed for nausea.   Pregnancy test is negative.   We are treating you for bacterial vaginosis.  Please use metrogel daily for 5 days. We will contact you if we need to arrange additional treatment based on your testing. Abstain from sex until you receive your final results.  Use a condom for the sexual encounter.  Use hypoallergenic soaps and detergents and wear loosefitting cotton underwear.  If you have any worsening or changing symptoms including abnormal discharge, pelvic pain, abdominal pain, fever, nausea, vomiting you should be reevaluated.

## 2023-03-31 LAB — CERVICOVAGINAL ANCILLARY ONLY
Bacterial Vaginitis (gardnerella): POSITIVE — AB
Candida Glabrata: NEGATIVE
Candida Vaginitis: NEGATIVE
Chlamydia: NEGATIVE
Comment: NEGATIVE
Comment: NEGATIVE
Comment: NEGATIVE
Comment: NEGATIVE
Comment: NEGATIVE
Comment: NORMAL
Neisseria Gonorrhea: NEGATIVE
Trichomonas: NEGATIVE

## 2023-05-12 ENCOUNTER — Ambulatory Visit (HOSPITAL_COMMUNITY)

## 2023-05-12 ENCOUNTER — Other Ambulatory Visit: Payer: Self-pay

## 2023-05-12 ENCOUNTER — Inpatient Hospital Stay (HOSPITAL_COMMUNITY)
Admission: AD | Admit: 2023-05-12 | Discharge: 2023-05-12 | Disposition: A | Discharge status: Home / Self Care | Attending: Obstetrics and Gynecology | Admitting: Obstetrics and Gynecology

## 2023-05-12 DIAGNOSIS — N920 Excessive and frequent menstruation with regular cycle: Secondary | ICD-10-CM | POA: Insufficient documentation

## 2023-05-12 DIAGNOSIS — N939 Abnormal uterine and vaginal bleeding, unspecified: Secondary | ICD-10-CM

## 2023-05-12 DIAGNOSIS — Z3202 Encounter for pregnancy test, result negative: Secondary | ICD-10-CM | POA: Diagnosis not present

## 2023-05-12 DIAGNOSIS — N921 Excessive and frequent menstruation with irregular cycle: Secondary | ICD-10-CM

## 2023-05-12 LAB — POCT PREGNANCY, URINE: Preg Test, Ur: NEGATIVE

## 2023-05-12 LAB — CBC
HCT: 36 % (ref 36.0–46.0)
Hemoglobin: 12 g/dL (ref 12.0–15.0)
MCH: 31.6 pg (ref 26.0–34.0)
MCHC: 33.3 g/dL (ref 30.0–36.0)
MCV: 94.7 fL (ref 80.0–100.0)
Platelets: 228 10*3/uL (ref 150–400)
RBC: 3.8 MIL/uL — ABNORMAL LOW (ref 3.87–5.11)
RDW: 12.1 % (ref 11.5–15.5)
WBC: 4.6 10*3/uL (ref 4.0–10.5)
nRBC: 0 % (ref 0.0–0.2)

## 2023-05-12 LAB — WET PREP, GENITAL
Sperm: NONE SEEN
Trich, Wet Prep: NONE SEEN
WBC, Wet Prep HPF POC: <10 (ref <10)
Yeast Wet Prep HPF POC: NONE SEEN

## 2023-05-12 MED ORDER — MEGESTROL ACETATE 40 MG PO TABS: 40.0000 mg | ORAL_TABLET | Freq: Two times a day (BID) | ORAL | 5 refills | Status: DC

## 2023-05-12 NOTE — Discharge Instructions (Signed)
 Please take Megace twice a day until your outpatient follow up with [facility].

## 2023-05-12 NOTE — MAU Note (Signed)
.  Madeline Wilkins is a 28 y.o. at Unknown here in MAU reporting: states she has gone 6 months since receiving her last DEPO shot. Has had ongoing heavy VB for the past month - using 2 maxi pads a time 4x/day. Denies pain. Reports feeling weak. Does not believe she is pregnant.   Onset of complaint: 1 month.  Pain score: 0 Vitals:   05/12/23 2059  BP: 124/76  Pulse: 81  Resp: 16  Temp: 97.9 F (36.6 C)  SpO2: 98%     FHT: NA  Lab orders placed from triage: urine preg; UA

## 2023-05-12 NOTE — MAU Provider Note (Signed)
 AUB    S [name] is a 28 y.o. [phone] non-pregnant female at who presents to MAU today with complaint of heavy vaginal bleeding. Pt states she stopped receiving depo for birthcontrol for about 6months.  She states this past month she has had continuous bleeding daily and is currently having to wear 2 maxi pads at a time and change those roughly 4x/day.  UPT negative.  Denies any other symptoms of F/C, abd pain, dysuria, N/V or other vaginal d/c.  VSS.     Pertinent items noted in HPI and remainder of comprehensive ROS otherwise negative.   O BP 124/76   Pulse 81   Temp 97.9 F (36.6 C) (Oral)   Resp 16   Ht 5\' 7"  (1.702 m)   Wt 80 kg   SpO2 98%   BMI 27.61 kg/m  Physical Exam Vitals and nursing note reviewed. Exam conducted with a chaperone present.  Constitutional:      General: She is not in acute distress.    Appearance: Normal appearance. She is normal weight. She is not ill-appearing.  HENT:     Head: Normocephalic and atraumatic.     Right Ear: External ear normal.     Left Ear: External ear normal.     Nose: Nose normal.     Mouth/Throat:     Mouth: Mucous membranes are moist.     Pharynx: Oropharynx is clear.  Eyes:     Extraocular Movements: Extraocular movements intact.     Conjunctiva/sclera: Conjunctivae normal.  Cardiovascular:     Rate and Rhythm: Normal rate.  Pulmonary:     Effort: Pulmonary effort is normal. No respiratory distress.  Abdominal:     General: Abdomen is flat. There is no distension.     Palpations: Abdomen is soft.     Tenderness: There is no abdominal tenderness.  Genitourinary:    General: Normal vulva.     Vagina: Vaginal discharge (bleeding, small ooze from external os) present.  Musculoskeletal:        General: No swelling. Normal range of motion.     Cervical back: Normal range of motion.  Skin:    General: Skin is warm and dry.  Neurological:     Mental Status: She is alert and oriented to person, place, and time.  Mental status is at baseline.     Motor: No weakness.     Gait: Gait normal.  Psychiatric:        Mood and Affect: Mood normal.        Behavior: Behavior normal.      MDM: MAU Course:  Pt with dark blood in posterior fornix and small ooze from external os.  No pooling noted.  Hgb 12, VSS, SORA.  Will send out on Megace with plans to follow up OP with PCP within the week for further evaluation.   AP #Menorrhagia  - Megace 40mg  BID until seen outpatient by PCP   Discharge from MAU in stable condition with strict/usual precautions Follow up at [facility] as scheduled for ongoing women's health care  Allergies as of 05/12/2023       Reactions   Shellfish Allergy         Medication List     TAKE these medications    megestrol 40 MG tablet Commonly known as: MEGACE Take 1 tablet (40 mg total) by mouth 2 (two) times daily. Can increase to two tablets twice a day in the event of heavy bleeding   metroNIDAZOLE  1 % gel Commonly known as: Metrogel Apply topically daily. For 5 days   ondansetron 4 MG disintegrating tablet Commonly known as: ZOFRAN-ODT Take 1 tablet (4 mg total) by mouth every 8 (eight) hours as needed for nausea or vomiting.        Hessie Dibble, MD 05/12/2023 10:45 PM

## 2023-05-13 ENCOUNTER — Ambulatory Visit: Admitting: Student

## 2023-05-13 LAB — HEPATITIS B CORE ANTIBODY, IGM: Hep B C IgM: NONREACTIVE

## 2023-05-13 LAB — HIV ANTIBODY (ROUTINE TESTING W REFLEX): HIV Screen 4th Generation wRfx: NONREACTIVE

## 2023-05-13 LAB — GC/CHLAMYDIA PROBE AMP (~~LOC~~) NOT AT ARMC
Chlamydia: NEGATIVE
Comment: NEGATIVE
Comment: NORMAL
Neisseria Gonorrhea: NEGATIVE

## 2023-05-13 LAB — RPR: RPR Ser Ql: NONREACTIVE

## 2023-05-13 LAB — HEPATITIS C ANTIBODY: HCV Ab: NONREACTIVE

## 2023-06-02 ENCOUNTER — Telehealth

## 2023-06-02 ENCOUNTER — Telehealth: Admitting: Physician Assistant

## 2023-06-02 DIAGNOSIS — N921 Excessive and frequent menstruation with irregular cycle: Secondary | ICD-10-CM

## 2023-06-02 MED ORDER — EQL SLOW RELEASE IRON 160 (50 FE) MG PO TBCR: 160.0000 mg | EXTENDED_RELEASE_TABLET | Freq: Every day | ORAL | 0 refills | Status: DC

## 2023-06-02 MED ORDER — IBUPROFEN 800 MG PO TABS: 800.0000 mg | ORAL_TABLET | Freq: Three times a day (TID) | ORAL | 0 refills | Status: DC | PRN

## 2023-06-02 NOTE — Patient Instructions (Signed)
 Madeline Wilkins, thank you for joining Margaretann Loveless, PA-C for today's virtual visit.  While this provider is not your primary care provider (PCP), if your PCP is located in our provider database this encounter information will be shared with them immediately following your visit.   A [facility] MyChart account gives you access to today's visit and all your visits, tests, and labs performed at [facility] " click here if you don't have a [facility] MyChart account or go to [email or web address]  Consent: (Patient) Madeline Wilkins provided verbal consent for this virtual visit at the beginning of the encounter.  Current Medications:  Current Outpatient Medications:    ferrous sulfate (EQL SLOW RELEASE IRON) 160 (50 Fe) MG TBCR SR tablet, Take 1 tablet (160 mg total) by mouth daily., Disp: 30 tablet, Rfl: 0   ibuprofen (ADVIL) 800 MG tablet, Take 1 tablet (800 mg total) by mouth every 8 (eight) hours as needed., Disp: 30 tablet, Rfl: 0   megestrol (MEGACE) 40 MG tablet, Take 1 tablet (40 mg total) by mouth 2 (two) times daily. Can increase to two tablets twice a day in the event of heavy bleeding, Disp: 60 tablet, Rfl: 5   metroNIDAZOLE (METROGEL) 1 % gel, Apply topically daily. For 5 days, Disp: 45 g, Rfl: 1   ondansetron (ZOFRAN-ODT) 4 MG disintegrating tablet, Take 1 tablet (4 mg total) by mouth every 8 (eight) hours as needed for nausea or vomiting., Disp: 20 tablet, Rfl: 0   Medications ordered in this encounter:  Meds ordered this encounter  Medications   ibuprofen (ADVIL) 800 MG tablet    Sig: Take 1 tablet (800 mg total) by mouth every 8 (eight) hours as needed.    Dispense:  30 tablet    Refill:  0    Supervising Provider:   Merrilee Jansky [[id number]]   ferrous sulfate (EQL SLOW RELEASE IRON) 160 (50 Fe) MG TBCR SR tablet    Sig: Take 1 tablet (160 mg total) by mouth daily.    Dispense:  30 tablet    Refill:  0    Supervising Provider:   Merrilee Jansky  [[id number]]     *If you need refills on other medications prior to your next appointment, please contact your pharmacy*  Follow-Up: Call back or seek an in-person evaluation if the symptoms worsen or if the condition fails to improve as anticipated.  [facility] [phone]  Other Instructions Abnormal Uterine Bleeding  Abnormal uterine bleeding is unusual bleeding from the uterus. It includes bleeding after sex, or bleeding or spotting between menstrual periods. It may also include bleeding that is heavier than normal, menstrual periods that last longer than usual, or bleeding that occurs after menopause. Abnormal uterine bleeding can affect teenagers, women in their reproductive years, pregnant women, and women who have reached menopause. Common causes of abnormal uterine bleeding include: Pregnancy. Abnormal growths within the lining of the uterus (polyps). Benign tumors or growths in the uterus (fibroids). These are not cancer. Infection. Cancer. Too much or too little of some hormones in the body (hormonal imbalances). Any type of abnormal bleeding should be checked by a health care provider. Many cases are minor and simple to treat, but others may be more serious. Treatment will depend on the cause of the bleeding and how severe it is. Follow these instructions at home: Medicines Take over-the-counter and prescription medicines only as told by your health care provider. Ask your health care provider about: Taking  medicines such as aspirin and ibuprofen. These medicines can thin your blood. Do not take these medicines unless your health care provider tells you to take them. Taking over-the-counter medicines, vitamins, herbs, and supplements. If you were prescribed iron pills, take them as told by your health care provider. Iron pills help to replace iron that your body loses because of this condition. Managing constipation In cases of severe bleeding, you may be asked  to increase your iron intake to treat anemia. Doing this may cause constipation. To prevent or treat constipation, you may need to: Drink enough fluid to keep your urine pale yellow. Take over-the-counter or prescription medicines. Eat foods that are high in fiber, such as beans, whole grains, and fresh fruits and vegetables. Limit foods that are high in fat and processed sugars, such as fried or sweet foods. Activity Alter your activity to decrease bleeding if you need to change your sanitary pad more than one time every 2 hours: Lie in bed with your feet raised (elevated). Place a cold pack on your lower abdomen. Rest as much as possible until the bleeding stops or slows down. General instructions Do not use tampons, douche, or have sex until your health care provider says these things are okay. Change your sanitary pads often. Get regular exams. These include pelvic exams and cervical cancer screenings. It is up to you to get the results of any tests that are done. Ask your health care provider, or the department that is doing the tests, when your results will be ready. Monitor your condition for any changes. For 2 months, write down: When your menstrual period starts. When your menstrual period ends. When any abnormal vaginal bleeding occurs. What problems you notice. Keep all follow-up visits. This is important. Contact a health care provider if: You have bleeding that lasts for more than one week. You feel dizzy at times. You feel nauseous or you vomit. You feel light-headed or weak. You notice any other changes that show that your condition is getting worse. Get help right away if: You faint. You have bleeding that soaks through a sanitary pad every hour. You have pain in the abdomen. You have a fever or chills. You become sweaty or weak. You pass large blood clots from your vagina. These symptoms may represent a serious problem that is an emergency. Do not wait to see if the  symptoms will go away. Get medical help right away. Call your local emergency services (911 in the [place] Do not drive yourself to the hospital. Summary Abnormal uterine bleeding is unusual bleeding from the uterus. Any type of abnormal bleeding should be checked by a health care provider. Many cases are minor and simple to treat, but others may be more serious. Treatment will depend on the cause of the bleeding and how severe it is. Get help right away if you faint, you have bleeding that soaks through a sanitary pad every hour, or you pass large blood clots from your vagina. This information is not intended to replace advice given to you by your health care provider. Make sure you discuss any questions you have with your health care provider. Document Revised: 09/23/2022 Document Reviewed: 06/20/2020 Elsevier Patient Education  2024 [employer]   If you have been instructed to have an in-person evaluation today at a local Urgent Care facility, please use the link below. It will take you to a list of all of our available [facility], including address, phone number and hours  of operation. Please do not delay care.  [facility]  If you or a family member do not have a primary care provider, use the link below to schedule a visit and establish care. When you choose a [facility] primary care physician or advanced practice provider, you gain a long-term partner in health. Find a Primary Care Provider  Learn more about [facility]'s in-office and virtual care options: [facility] - Get Care Now

## 2023-06-02 NOTE — Progress Notes (Signed)
 Virtual Visit Consent   Madeline Wilkins, you are scheduled for a virtual visit with a [facility] provider today. Just as with appointments in the office, your consent must be obtained to participate. Your consent will be active for this visit and any virtual visit you may have with one of our providers in the next 365 days. If you have a MyChart account, a copy of this consent can be sent to you electronically.  As this is a virtual visit, video technology does not allow for your provider to perform a traditional examination. This may limit your provider's ability to fully assess your condition. If your provider identifies any concerns that need to be evaluated in person or the need to arrange testing (such as labs, EKG, etc.), we will make arrangements to do so. Although advances in technology are sophisticated, we cannot ensure that it will always work on either your end or our end. If the connection with a video visit is poor, the visit may have to be switched to a telephone visit. With either a video or telephone visit, we are not always able to ensure that we have a secure connection.  By engaging in this virtual visit, you consent to the provision of healthcare and authorize for your insurance to be billed (if applicable) for the services provided during this visit. Depending on your insurance coverage, you may receive a charge related to this service.  I need to obtain your verbal consent now. Are you willing to proceed with your visit today? Madeline Thackeray has provided verbal consent on 06/02/2023 for a virtual visit (video or telephone). Margaretann Loveless, PA-C  Date: 06/02/2023 5:13 PM   Virtual Visit via Video Note   IMargaretann Loveless, connected with  Necha Harries  ([id number], 08-04-1995) on 06/02/23 at  5:00 PM EDT by a video-enabled telemedicine application and verified that I am speaking with the correct person using two identifiers.  Location: Patient: Virtual Visit Location  Patient: Home Provider: Virtual Visit Location Provider: Home Office   I discussed the limitations of evaluation and management by telemedicine and the availability of in person appointments. The patient expressed understanding and agreed to proceed.    History of Present Illness: Madeline Wilkins is a 28 y.o. who identifies as a female who was assigned female at birth, and is being seen today for myalgias and cramping.  Got Depo-Provera injection 3-6 months ago and has had intermittent bleeding since. Particularly worse over the last 2 months with fairly heavy bleeding. Does report it is slowly getting lighter, but still persistent.   She did go to the [facility] on 05/12/23 and was told everything seemed okay. Labs did not show anemia.  She has not had any imaging completed recently.   Today felt weak and was having cramping so she did not go to work.    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   Postpartum care following vaginal delivery 07/08/2022   History of delivery of macrosomal infant 06/23/2022   Hyperprolactinemia (HCC) 07/20/2020    Allergies:  Allergies  Allergen Reactions   Shellfish Allergy    Medications:  Current Outpatient Medications:    ferrous sulfate (EQL SLOW RELEASE IRON) 160 (50 Fe) MG TBCR SR tablet, Take 1 tablet (160 mg total) by mouth daily., Disp: 30 tablet, Rfl: 0   ibuprofen (ADVIL) 800 MG tablet, Take 1  tablet (800 mg total) by mouth every 8 (eight) hours as needed., Disp: 30 tablet, Rfl: 0   megestrol (MEGACE) 40 MG tablet, Take 1 tablet (40 mg total) by mouth 2 (two) times daily. Can increase to two tablets twice a day in the event of heavy bleeding, Disp: 60 tablet, Rfl: 5   metroNIDAZOLE (METROGEL) 1 % gel, Apply topically daily. For 5 days, Disp: 45 g, Rfl: 1   ondansetron (ZOFRAN-ODT) 4 MG disintegrating tablet, Take 1  tablet (4 mg total) by mouth every 8 (eight) hours as needed for nausea or vomiting., Disp: 20 tablet, Rfl: 0  Observations/Objective: Patient is well-developed, well-nourished in no acute distress.  Resting comfortably at home.  Head is normocephalic, atraumatic.  No labored breathing.  Speech is clear and coherent with logical content.  Patient is alert and oriented at baseline.    Assessment and Plan: 1. Menorrhagia with irregular cycle (Primary) - ibuprofen (ADVIL) 800 MG tablet; Take 1 tablet (800 mg total) by mouth every 8 (eight) hours as needed.  Dispense: 30 tablet; Refill: 0 - ferrous sulfate (EQL SLOW RELEASE IRON) 160 (50 Fe) MG TBCR SR tablet; Take 1 tablet (160 mg total) by mouth daily.  Dispense: 30 tablet; Refill: 0  - Add Ibuprofen as needed for body aches and cramping - Add slow release iron for fatigue and weakness - Add Magnesium Glycinate for cramping 200mg  daily - Warm compress - Epsom salt soaks - Work not provided - Follow up with PCP for further evaluation  Follow Up Instructions: I discussed the assessment and treatment plan with the patient. The patient was provided an opportunity to ask questions and all were answered. The patient agreed with the plan and demonstrated an understanding of the instructions.  A copy of instructions were sent to the patient via MyChart unless otherwise noted below.    The patient was advised to call back or seek an in-person evaluation if the symptoms worsen or if the condition fails to improve as anticipated.    Margaretann Loveless, PA-C

## 2023-07-19 ENCOUNTER — Ambulatory Visit (HOSPITAL_COMMUNITY): Payer: Self-pay

## 2023-07-31 ENCOUNTER — Encounter (HOSPITAL_COMMUNITY): Payer: Self-pay | Admitting: Emergency Medicine

## 2023-07-31 ENCOUNTER — Ambulatory Visit (HOSPITAL_COMMUNITY)
Admission: EM | Admit: 2023-07-31 | Discharge: 2023-07-31 | Disposition: A | Discharge status: Home / Self Care | Attending: Family Medicine | Admitting: Family Medicine

## 2023-07-31 DIAGNOSIS — Z202 Contact with and (suspected) exposure to infections with a predominantly sexual mode of transmission: Secondary | ICD-10-CM | POA: Insufficient documentation

## 2023-07-31 LAB — HIV ANTIBODY (ROUTINE TESTING W REFLEX): HIV Screen 4th Generation wRfx: NONREACTIVE

## 2023-07-31 NOTE — ED Provider Notes (Signed)
 [facility]    CSN: [id number] Arrival date & time: 07/31/23  1404      History   Chief Complaint Chief Complaint  Patient presents with   SEXUALLY TRANSMITTED DISEASE    HPI Madeline Wilkins is a 28 y.o. female.   HPI Here for possible exposure to an STD.  She recently had unprotected intercourse and is wanting to get tested for sexually transmitted diseases.  Currently, no abdominal pain or pelvic pain or discharge or itching.  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   Postpartum care following vaginal delivery 07/08/2022   History of delivery of macrosomal infant 06/23/2022   Hyperprolactinemia (HCC) 07/20/2020    Past Surgical History:  Procedure Laterality Date   INDUCED ABORTION     WISDOM TOOTH EXTRACTION Bilateral    Bottom Teeth    OB History     Gravida  4   Para  2   Term  2   Preterm  0   AB  2   Living  2      SAB  0   IAB  2   Ectopic  0   Multiple  0   Live Births  2        Obstetric Comments  G2: mid December 2017 in office D&C (elective AB)          Home Medications    Prior to Admission medications   Medication Sig Start Date End Date Taking? Authorizing Provider  ferrous sulfate (EQL SLOW RELEASE IRON ) 160 (50 Fe) MG TBCR SR tablet Take 1 tablet (160 mg total) by mouth daily. 06/02/23   Burnette, Jennifer M, PA-C  megestrol  (MEGACE ) 40 MG tablet Take 1 tablet (40 mg  total) by mouth 2 (two) times daily. Can increase to two tablets twice a day in the event of heavy bleeding 05/12/23   Ebony Goldstein, MD    Family History Family History  Problem Relation Age of Onset   Healthy Mother    Healthy Father    Cancer Neg Hx    Diabetes Neg Hx    Heart disease Neg Hx    Hypertension Neg Hx    Stroke Neg Hx    Hearing loss Neg Hx    Asthma Neg Hx    Other Neg Hx        elev prolactin    Social History Social History   Tobacco Use   Smoking status: Never   Smokeless tobacco: Never  Vaping Use   Vaping status: Never Used  Substance Use Topics   Alcohol use: Not Currently   Drug use: No     Allergies   Shellfish allergy   Review of Systems Review of Systems   Physical Exam Triage Vital Signs ED Triage Vitals  Encounter Vitals Group     BP 07/31/23 1426 121/79  Systolic BP Percentile --      Diastolic BP Percentile --      Pulse Rate 07/31/23 1426 76     Resp 07/31/23 1426 15     Temp 07/31/23 1426 97.7 F (36.5 C)     Temp src --      SpO2 07/31/23 1426 98 %     Weight --      Height --      Head Circumference --      Peak Flow --      Pain Score 07/31/23 1424 0     Pain Loc --      Pain Education --      Exclude from Growth Chart --    No data found.  Updated Vital Signs BP 121/79 (BP Location: Left Arm)   Pulse 76   Temp 97.7 F (36.5 C)   Resp 15   LMP 07/19/2023   SpO2 98%   Visual Acuity Right Eye Distance:   Left Eye Distance:   Bilateral Distance:    Right Eye Near:   Left Eye Near:    Bilateral Near:     Physical Exam Vitals reviewed.  Constitutional:      General: She is not in acute distress.    Appearance: She is not toxic-appearing.  Skin:    Coloration: Skin is not pale.  Neurological:     Mental Status: She is alert and oriented to person, place, and time.  Psychiatric:        Behavior: Behavior normal.      UC Treatments / Results  Labs (all labs ordered are listed, but only  abnormal results are displayed) Labs Reviewed  RPR  HIV ANTIBODY (ROUTINE TESTING W REFLEX)  CERVICOVAGINAL ANCILLARY ONLY    EKG   Radiology No results found.  Procedures Procedures (including critical care time)  Medications Ordered in UC Medications - No data to display  Initial Impression / Assessment and Plan / UC Course  I have reviewed the triage vital signs and the nursing notes.  Pertinent labs & imaging results that were available during my care of the patient were reviewed by me and considered in my medical decision making (see chart for details).     Blood work is drawn to check HIV and RPR.  Vaginal self swab is done, and we will notify of any positives on that and treat per protocol.  Final Clinical Impressions(s) / UC Diagnoses   Final diagnoses:  Potential exposure to STD     Discharge Instructions      Staff will notify you if there is anything positive on the swab or on the blood work.    ED Prescriptions   None    PDMP not reviewed this encounter.   Ann Keto, MD 07/31/23 1450

## 2023-07-31 NOTE — Discharge Instructions (Signed)
 Staff will notify you if there is anything positive on the swab or on the blood work.

## 2023-07-31 NOTE — ED Triage Notes (Signed)
 Pt reports 'had a slip up at [facility]" where had unprotected sexual intercourse.

## 2023-08-01 LAB — RPR: RPR Ser Ql: NONREACTIVE

## 2023-08-01 LAB — CERVICOVAGINAL ANCILLARY ONLY
Chlamydia: NEGATIVE
Comment: NEGATIVE
Comment: NEGATIVE
Comment: NORMAL
Neisseria Gonorrhea: NEGATIVE
Trichomonas: NEGATIVE

## 2023-09-09 ENCOUNTER — Ambulatory Visit (HOSPITAL_COMMUNITY)
Admission: RE | Admit: 2023-09-09 | Discharge: 2023-09-09 | Disposition: A | Discharge status: Home / Self Care | Source: Ambulatory Visit | Attending: Emergency Medicine | Admitting: Emergency Medicine

## 2023-09-09 ENCOUNTER — Encounter (HOSPITAL_COMMUNITY): Payer: Self-pay

## 2023-09-09 VITALS — BP 133/80 | HR 101 | Temp 98.3°F | Resp 18

## 2023-09-09 DIAGNOSIS — Z113 Encounter for screening for infections with a predominantly sexual mode of transmission: Secondary | ICD-10-CM | POA: Diagnosis not present

## 2023-09-09 LAB — POCT URINE PREGNANCY: Preg Test, Ur: NEGATIVE

## 2023-09-09 NOTE — Discharge Instructions (Addendum)
 We will call you if anything on your swab returns positive. You can also see these results on MyChart. Please abstain from sexual intercourse until your results return.

## 2023-09-09 NOTE — ED Provider Notes (Signed)
 [facility]    CSN: [id number] Arrival date & time: 09/09/23  1902     History   Chief Complaint Chief Complaint  Patient presents with   appt - STD    HPI Madeline Wilkins is a 28 y.o. female.  Requesting STD testing today She had LLQ pain 2 days ago that went away. Reports in the past she had an STD with this symptom. However on chart review she's had frequent BV infections, and symptoms reported at the time were some lower abdominal discomfort.  Had chlamydia in 2023 during pregnancy. She does have some vaginal discharge. No itching or odor.  Unprotected intercourse 2 weeks ago LMP 6/20  Currently breastfeeding   She is requesting blood work today. 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   Postpartum care following vaginal delivery 07/08/2022   History of delivery of macrosomal infant 06/23/2022   Hyperprolactinemia (HCC) 07/20/2020    Past Surgical History:  Procedure Laterality Date   INDUCED ABORTION     WISDOM TOOTH EXTRACTION Bilateral    Bottom Teeth    OB History     Gravida  4   Para  2   Term  2   Preterm  0   AB  2   Living  2      SAB  0   IAB  2   Ectopic  0   Multiple  0   Live Births  2        Obstetric Comments  G2: mid December 2017 in office D&C (elective AB)          Home Medications    Prior to Admission medications   Medication Sig Start Date  End Date Taking? Authorizing Provider  megestrol  (MEGACE ) 40 MG tablet Take 1 tablet (40 mg total) by mouth 2 (two) times daily. Can increase to two tablets twice a day in the event of heavy bleeding 05/12/23   Jhonny Augustin BROCKS, MD    Family History Family History  Problem Relation Age of Onset   Healthy Mother    Healthy Father    Cancer Neg Hx    Diabetes Neg Hx    Heart disease Neg Hx    Hypertension Neg Hx    Stroke Neg Hx    Hearing loss Neg Hx    Asthma Neg Hx    Other Neg Hx        elev prolactin    Social History Social History   Tobacco Use   Smoking status: Never   Smokeless tobacco: Never  Vaping Use   Vaping status: Never Used  Substance Use Topics   Alcohol use: Not Currently   Drug use: No     Allergies   Shellfish allergy   Review of Systems Review of Systems As per HPI  Physical Exam Triage Vital Signs  ED Triage Vitals  Encounter Vitals Group     BP 09/09/23 1933 133/80     Girls Systolic BP Percentile --      Girls Diastolic BP Percentile --      Boys Systolic BP Percentile --      Boys Diastolic BP Percentile --      Pulse Rate 09/09/23 1933 (!) 101     Resp 09/09/23 1933 18     Temp 09/09/23 1933 98.3 F (36.8 C)     Temp Source 09/09/23 1933 Oral     SpO2 09/09/23 1933 96 %     Weight --      Height --      Head Circumference --      Peak Flow --      Pain Score 09/09/23 1934 0     Pain Loc --      Pain Education --      Exclude from Growth Chart --    No data found.  Updated Vital Signs BP 133/80 (BP Location: Left Arm)   Pulse (!) 101   Temp 98.3 F (36.8 C) (Oral)   Resp 18   LMP 08/22/2023   SpO2 96%   Breastfeeding Yes    Physical Exam Vitals and nursing note reviewed.  Constitutional:      General: She is not in acute distress.    Appearance: Normal appearance.  HENT:     Mouth/Throat:     Pharynx: Oropharynx is clear.  Cardiovascular:     Rate and Rhythm: Normal rate and regular rhythm.     Pulses: Normal  pulses.     Heart sounds: Normal heart sounds.  Pulmonary:     Effort: Pulmonary effort is normal.     Breath sounds: Normal breath sounds.  Abdominal:     Palpations: Abdomen is soft.  Neurological:     Mental Status: She is alert and oriented to person, place, and time.     UC Treatments / Results  Labs (all labs ordered are listed, but only abnormal results are displayed) Labs Reviewed  POCT URINE PREGNANCY  CERVICOVAGINAL ANCILLARY ONLY    EKG  Radiology No results found.  Procedures Procedures (including critical care time)  Medications Ordered in UC Medications - No data to display  Initial Impression / Assessment and Plan / UC Course  I have reviewed the triage vital signs and the nursing notes.  Pertinent labs & imaging results that were available during my care of the patient were reviewed by me and considered in my medical decision making (see chart for details).   UPT negative  Cytology swab pending. Treat positive as indicated IF POSITIVE FOR BV, PATIENT PREFERS METROGEL . NOT FLAGYL . Defer blood work, was negative 1 month ago No questions   Final Clinical Impressions(s) / UC Diagnoses   Final diagnoses:  Screen for STD (sexually transmitted disease)     Discharge Instructions      We will call you if anything on your swab returns positive. You can also see these results on MyChart. Please abstain from sexual intercourse until your results return.     ED Prescriptions   None    PDMP not reviewed this encounter.   Julyana Woolverton, [place] RIGGERS 09/09/23 2013

## 2023-09-09 NOTE — ED Triage Notes (Addendum)
 Pt requesting STD testing. States 2 days ago had pain to LLQ which has had that before with STD. States had unprotected 2 wks ago. Pt requesting blood work.

## 2023-09-10 LAB — CERVICOVAGINAL ANCILLARY ONLY
Bacterial Vaginitis (gardnerella): POSITIVE — AB
Candida Glabrata: NEGATIVE
Candida Vaginitis: NEGATIVE
Chlamydia: NEGATIVE
Comment: NEGATIVE
Comment: NEGATIVE
Comment: NEGATIVE
Comment: NEGATIVE
Comment: NEGATIVE
Comment: NORMAL
Neisseria Gonorrhea: NEGATIVE
Trichomonas: NEGATIVE

## 2023-09-12 ENCOUNTER — Ambulatory Visit (HOSPITAL_COMMUNITY): Payer: Self-pay | Admitting: Emergency Medicine

## 2023-09-12 MED ORDER — METRONIDAZOLE 0.75 % VA GEL: 1.0000 | Freq: Every evening | VAGINAL | 0 refills | Status: AC

## 2023-09-18 ENCOUNTER — Encounter (HOSPITAL_COMMUNITY): Payer: Self-pay

## 2023-09-18 ENCOUNTER — Ambulatory Visit (HOSPITAL_COMMUNITY)
Admission: RE | Admit: 2023-09-18 | Discharge: 2023-09-18 | Disposition: A | Discharge status: Home / Self Care | Source: Ambulatory Visit

## 2023-09-18 ENCOUNTER — Encounter: Payer: Self-pay | Admitting: Student

## 2023-09-18 VITALS — BP 131/89 | HR 81 | Temp 98.6°F | Resp 16

## 2023-09-18 DIAGNOSIS — Z202 Contact with and (suspected) exposure to infections with a predominantly sexual mode of transmission: Secondary | ICD-10-CM

## 2023-09-18 NOTE — ED Triage Notes (Addendum)
 Pt states she has no sx, she states no outbreak. She wants HSV testing. Pt states she hasn't had money for metrogel , so she picked up these meds yet.   She said she realized that she wasn't tested for HSV last time.

## 2023-09-18 NOTE — ED Provider Notes (Signed)
 [facility]    CSN: [id number] Arrival date & time: 09/18/23  1908      History   Chief Complaint Chief Complaint  Patient presents with   Exposure to STD    I want to get tested for HSV - Entered by patient    HPI Aven Cegielski is a 28 y.o. female.   Patient presents to clinic requesting HSV testing.  She has never had any blistered sores or lesions.  Have been seen self on social media recently and would like to get tested.  The history is provided by the patient and medical records.  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   Postpartum care following vaginal delivery 07/08/2022   History of delivery of macrosomal infant 06/23/2022   Hyperprolactinemia (HCC) 07/20/2020    Past Surgical History:  Procedure Laterality Date   INDUCED ABORTION     WISDOM TOOTH EXTRACTION Bilateral    Bottom Teeth    OB History     Gravida  4   Para  2   Term  2   Preterm  0   AB  2   Living  2      SAB  0   IAB  2   Ectopic  0   Multiple  0   Live Births  2        Obstetric Comments  G2: mid December 2017 in office D&C (elective AB)          Home Medications    Prior to Admission medications   Medication Sig Start Date End Date Taking? Authorizing Provider  metroNIDAZOLE  (METROGEL ) 0.75 % vaginal gel Place 1 Applicatorful vaginally 2 (two) times daily.   Yes [provider]  megestrol  (MEGACE ) 40 MG tablet Take 1  tablet (40 mg total) by mouth 2 (two) times daily. Can increase to two tablets twice a day in the event of heavy bleeding 05/12/23   Jhonny Augustin BROCKS, MD    Family History Family History  Problem Relation Age of Onset   Healthy Mother    Healthy Father    Cancer Neg Hx    Diabetes Neg Hx    Heart disease Neg Hx    Hypertension Neg Hx    Stroke Neg Hx    Hearing loss Neg Hx    Asthma Neg Hx    Other Neg Hx        elev prolactin    Social History Social History   Tobacco Use   Smoking status: Never   Smokeless tobacco: Never  Vaping Use   Vaping status: Never Used  Substance Use Topics   Alcohol use: Not Currently   Drug use: No     Allergies   Shellfish allergy   Review of Systems Review of Systems  Per HPI  Physical Exam Triage Vital Signs ED Triage Vitals  Encounter Vitals Group  BP 09/18/23 1923 131/89     Girls Systolic BP Percentile --      Girls Diastolic BP Percentile --      Boys Systolic BP Percentile --      Boys Diastolic BP Percentile --      Pulse Rate 09/18/23 1923 81     Resp 09/18/23 1923 16     Temp 09/18/23 1923 98.6 F (37 C)     Temp Source 09/18/23 1923 Oral     SpO2 09/18/23 1923 96 %     Weight --      Height --      Head Circumference --      Peak Flow --      Pain Score 09/18/23 1921 0     Pain Loc --      Pain Education --      Exclude from Growth Chart --    No data found.  Updated Vital Signs BP 131/89 (BP Location: Left Arm)   Pulse 81   Temp 98.6 F (37 C) (Oral)   Resp 16   LMP 08/22/2023 (Exact Date)   SpO2 96%   Visual Acuity Right Eye Distance:   Left Eye Distance:   Bilateral Distance:    Right Eye Near:   Left Eye Near:    Bilateral Near:     Physical Exam Vitals and nursing note reviewed.  Constitutional:      Appearance: Normal appearance.  HENT:     Head: Normocephalic and atraumatic.     Nose: Nose normal.     Mouth/Throat:     Mouth: Mucous membranes are moist.  Cardiovascular:      Rate and Rhythm: Normal rate.  Pulmonary:     Effort: Pulmonary effort is normal. No respiratory distress.  Neurological:     General: No focal deficit present.     Mental Status: She is alert.  Psychiatric:        Mood and Affect: Mood normal.      UC Treatments / Results  Labs (all labs ordered are listed, but only abnormal results are displayed) Labs Reviewed - No data to display  EKG   Radiology No results found.  Procedures Procedures (including critical care time)  Medications Ordered in UC Medications - No data to display  Initial Impression / Assessment and Plan / UC Course  I have reviewed the triage vital signs and the nursing notes.  Pertinent labs & imaging results that were available during my care of the patient were reviewed by me and considered in my medical decision making (see chart for details).  Vitals and triage reviewed, patient is hemodynamically stable.  Discussed due to lack of active vesicular lesions, we are not able to do the HSV surface swab.  No history of HSV.  Encouraged PCP follow-up for HPV testing and to see if they offer serum testing.  Patient verbalized understanding, no questions at this time.     Final Clinical Impressions(s) / UC Diagnoses   Final diagnoses:  Possible exposure to STD     Discharge Instructions      If you have an active lesion please return to clinic for reevaluation and we can swab any blistered lesions for HSV.  Follow-up as scheduled for your routine Pap smear and HPV testing.  You can contact your primary care provider to see if they offer HSV serum testing.    ED Prescriptions   None    PDMP not reviewed this encounter.  Dreama, Pearlena Ow  N, FNP 09/18/23 1946

## 2023-09-18 NOTE — Telephone Encounter (Signed)
 Called patient.   She reports she just picked up the medication for recent BV infection at [facility].   She reports she has another apt with them this evening for possible HSV serum testing.   Nothing further needed at this time.

## 2023-09-18 NOTE — Discharge Instructions (Addendum)
 If you have an active lesion please return to clinic for reevaluation and we can swab any blistered lesions for HSV.  Follow-up as scheduled for your routine Pap smear and HPV testing.  You can contact your primary care provider to see if they offer HSV serum testing.

## 2023-10-08 ENCOUNTER — Ambulatory Visit (HOSPITAL_COMMUNITY): Payer: Self-pay

## 2023-10-14 ENCOUNTER — Ambulatory Visit

## 2023-10-20 ENCOUNTER — Telehealth: Admitting: Physician Assistant

## 2023-10-20 DIAGNOSIS — N898 Other specified noninflammatory disorders of vagina: Secondary | ICD-10-CM

## 2023-10-20 NOTE — Progress Notes (Signed)
  Because of the need for a pelvic exam, I feel your condition warrants further evaluation and I recommend that you be seen in a face-to-face visit.   NOTE: There will be NO CHARGE for this E-Visit   If you are having a true medical emergency, please call 911.     For an urgent face to face visit, [place] has multiple urgent care centers for your convenience.  Click the link below for the full list of locations and hours, walk-in wait times, appointment scheduling options and driving directions:  Urgent Care - [place], [place], [place], [place], [place], [place]  [place]     Your MyChart E-visit questionnaire answers were reviewed by a board certified advanced clinical practitioner to complete your personal care plan based on your specific symptoms.    Thank you for using e-Visits.

## 2023-10-24 ENCOUNTER — Ambulatory Visit (HOSPITAL_COMMUNITY)
Admission: EM | Admit: 2023-10-24 | Discharge: 2023-10-24 | Disposition: A | Discharge status: Home / Self Care | Attending: Emergency Medicine | Admitting: Emergency Medicine

## 2023-10-24 ENCOUNTER — Encounter (HOSPITAL_COMMUNITY): Payer: Self-pay

## 2023-10-24 DIAGNOSIS — N760 Acute vaginitis: Secondary | ICD-10-CM | POA: Diagnosis not present

## 2023-10-24 DIAGNOSIS — Z3202 Encounter for pregnancy test, result negative: Secondary | ICD-10-CM | POA: Diagnosis not present

## 2023-10-24 LAB — POCT URINALYSIS DIP (MANUAL ENTRY)
Bilirubin, UA: NEGATIVE
Blood, UA: NEGATIVE
Glucose, UA: NEGATIVE mg/dL
Ketones, POC UA: NEGATIVE mg/dL
Nitrite, UA: NEGATIVE
Protein Ur, POC: NEGATIVE mg/dL
Spec Grav, UA: 1.025 (ref 1.010–1.025)
Urobilinogen, UA: 0.2 U/dL
pH, UA: 5.5 (ref 5.0–8.0)

## 2023-10-24 LAB — POCT URINE PREGNANCY: Preg Test, Ur: NEGATIVE

## 2023-10-24 LAB — HIV ANTIBODY (ROUTINE TESTING W REFLEX): HIV Screen 4th Generation wRfx: NONREACTIVE

## 2023-10-24 MED ORDER — METRONIDAZOLE 500 MG PO TABS: 500.0000 mg | ORAL_TABLET | Freq: Two times a day (BID) | ORAL | 0 refills | Status: DC

## 2023-10-24 MED ORDER — METRONIDAZOLE 0.75 % EX GEL: 1.0000 | Freq: Two times a day (BID) | CUTANEOUS | 0 refills | Status: AC

## 2023-10-24 NOTE — Discharge Instructions (Addendum)
 Use Metrogel  vaginally as prescribed Will call with test results and change treatment plan if needed Return if no improvement or symptoms become worse.

## 2023-10-24 NOTE — ED Provider Notes (Incomplete)
 [facility]    CSN: [id number] Arrival date & time: 10/24/23  8147      History   Chief Complaint Chief Complaint  Patient presents with   Abdominal Pain    HPI Madeline Wilkins is a 28 y.o. female.   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   Postpartum care following vaginal delivery 07/08/2022   History of delivery of macrosomal infant 06/23/2022   Hyperprolactinemia (HCC) 07/20/2020    Past Surgical History:  Procedure Laterality Date   INDUCED ABORTION     WISDOM TOOTH EXTRACTION Bilateral    Bottom Teeth    OB History     Gravida  4   Para  2   Term  2   Preterm  0   AB  2   Living  2      SAB  0   IAB  2   Ectopic  0   Multiple  0   Live Births  2        Obstetric Comments  G2: mid December 2017 in office D&C (elective AB)          Home Medications    Prior to Admission medications   Medication Sig Start Date End Date Taking? Authorizing Provider  metroNIDAZOLE  (METROGEL ) 0.75 % gel Apply 1 Application topically 2 (two) times daily for 5 days. 10/24/23 10/29/23 Yes Ward, Harlene PEDLAR, PA-C    Family History Family History  Problem Relation Age of Onset   Healthy Mother    Healthy Father    Cancer Neg Hx    Diabetes Neg Hx    Heart disease Neg Hx    Hypertension Neg Hx    Stroke Neg Hx    Hearing loss Neg Hx    Asthma Neg Hx    Other Neg Hx        elev prolactin    Social History Social History   Tobacco  Use   Smoking status: Never   Smokeless tobacco: Never  Vaping Use   Vaping status: Never Used  Substance Use Topics   Alcohol use: Not Currently   Drug use: No     Allergies   Shellfish allergy   Review of Systems Review of Systems   Physical Exam Triage Vital Signs ED Triage Vitals  Encounter Vitals Group     BP 10/24/23 1943 121/81     Girls Systolic BP Percentile --      Girls Diastolic BP Percentile --      Boys Systolic BP Percentile --      Boys Diastolic BP Percentile --      Pulse Rate 10/24/23 1943 77     Resp 10/24/23 1943 16     Temp 10/24/23 1943 98.1 F (36.7 C)     Temp Source 10/24/23 1943 Oral     SpO2 10/24/23 1943 99 %     Weight --  Height --      Head Circumference --      Peak Flow --      Pain Score 10/24/23 1945 6     Pain Loc --      Pain Education --      Exclude from Growth Chart --    No data found.  Updated Vital Signs BP 121/81 (BP Location: Right Arm)   Pulse 77   Temp 98.1 F (36.7 C) (Oral)   Resp 16   LMP 09/08/2023 (Approximate)   SpO2 99%   Breastfeeding Yes   Visual Acuity Right Eye Distance:   Left Eye Distance:   Bilateral Distance:    Right Eye Near:   Left Eye Near:    Bilateral Near:     Physical Exam   UC Treatments / Results  Labs (all labs ordered are listed, but only abnormal results are displayed) Labs Reviewed  POCT URINALYSIS DIP (MANUAL ENTRY) - Abnormal; Notable for the following components:      Result Value   Leukocytes, UA Trace (*)    All other components within normal limits  HIV ANTIBODY (ROUTINE TESTING W REFLEX)  RPR  POCT URINE PREGNANCY  CERVICOVAGINAL ANCILLARY ONLY    EKG   Radiology No results found.  Procedures Procedures (including critical care time)  Medications Ordered in UC Medications - No data to display  Initial Impression / Assessment and Plan / UC Course  I have reviewed the triage vital signs and the nursing notes.  Pertinent labs & imaging  results that were available during my care of the patient were reviewed by me and considered in my medical decision making (see chart for details).     *** Final Clinical Impressions(s) / UC Diagnoses   Final diagnoses:  Vaginosis     Discharge Instructions      Use Metrogel  vaginally as prescribed Will call with test results and change treatment plan if needed Return if no improvement or symptoms become worse.     ED Prescriptions     Medication Sig Dispense Auth. Provider   metroNIDAZOLE  (FLAGYL ) 500 MG tablet  (Status: Discontinued) Take 1 tablet (500 mg total) by mouth 2 (two) times daily for 7 days. 14 tablet Ward, Hriday Stai Z, PA-C   metroNIDAZOLE  (METROGEL ) 0.75 % gel Apply 1 Application topically 2 (two) times daily for 5 days. 10 g Ward, Harlene PEDLAR, PA-C      PDMP not reviewed this encounter.

## 2023-10-24 NOTE — ED Triage Notes (Signed)
 Patient here today with c/o lower abd pain and tingling X 1 week. Patient has also had some vaginal odor. She had some cream for BV and she tried that with no relief.

## 2023-10-25 LAB — RPR: RPR Ser Ql: NONREACTIVE

## 2023-10-27 ENCOUNTER — Ambulatory Visit (HOSPITAL_COMMUNITY): Payer: Self-pay

## 2023-10-27 ENCOUNTER — Ambulatory Visit: Admitting: Student

## 2023-10-27 LAB — CERVICOVAGINAL ANCILLARY ONLY
Bacterial Vaginitis (gardnerella): POSITIVE — AB
Candida Glabrata: NEGATIVE
Candida Vaginitis: NEGATIVE
Chlamydia: NEGATIVE
Comment: NEGATIVE
Comment: NEGATIVE
Comment: NEGATIVE
Comment: NEGATIVE
Comment: NEGATIVE
Comment: NORMAL
Neisseria Gonorrhea: NEGATIVE
Trichomonas: NEGATIVE

## 2023-12-11 NOTE — Progress Notes (Incomplete)
 28 y.o. [id number] female here for annual exam. Single. PCP: Cleotilde Perkins, DO  No LMP recorded. (Menstrual status: Irregular Periods).    She reports ***. Urine sample provided: ***  Abnormal bleeding: *** Pelvic discharge or pain: *** Breast mass, nipple discharge or skin changes : ***  Sexually active: ***  Birth control: *** Last PAP:     Component Value Date/Time   DIAGPAP  02/13/2023 1055    - Negative for intraepithelial lesion or malignancy (NILM)   DIAGPAP  07/26/2019 1029    - Negative for intraepithelial lesion or malignancy (NILM)   ADEQPAP  02/13/2023 1055    Satisfactory for evaluation; transformation zone component PRESENT.   ADEQPAP  07/26/2019 1029    Satisfactory for evaluation; transformation zone component ABSENT.   Gardasil: ***  Exercising: *** Smoker: ***  Flowsheet Row Office Visit from 02/13/2023 in [facility] [place]  PHQ-2 Total Score 0    Flowsheet Row Office Visit from 02/13/2023 in [facility]  PHQ-9 Total Score 1     GYN HISTORY: ***  OB History  Gravida Para Term Preterm AB Living  4 2 2  0 2 2  SAB IAB Ectopic Multiple Live Births  0 2 0 0 2    # Outcome Date GA Lbr Len/2nd Weight Sex Type Anes PTL Lv  4 Term 06/23/22 [redacted]w[redacted]d  9 lb 10.5 oz (4.38 kg) F Vag-Spont None  LIV  3 IAB 2023 [redacted]w[redacted]d         2 Term 09/17/15 [redacted]w[redacted]d 08:09 / 00:04 7 lb 12.5 oz (3.53 kg) F Vag-Spont EPI  LIV  1 IAB 2017            Obstetric Comments  G2: mid December 2017 in office D&C (elective AB)   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   No current facility-administered medications on file prior to visit.   Social History   Socioeconomic History   Marital status: Single    Spouse name: Not on file   Number of children: Not on file   Years of education: Not on file   Highest education level: Not on file  Occupational History    Employer: [employer]  Tobacco Use   Smoking status: Never   Smokeless tobacco: Never  Vaping Use   Vaping status: Never Used  Substance and Sexual Activity   Alcohol use: Not Currently   Drug use: No   Sexual activity: Yes    Birth control/protection: None  Other Topics Concern   Not on file  Social History Narrative   Not on file   Social Drivers of Health   Financial Resource Strain: Not on file  Food Insecurity: No Food Insecurity (06/23/2022)   Hunger Vital Sign  Worried About Programme researcher, broadcasting/film/video in the Last Year: Never true    Ran Out of Food in the Last Year: Never true  Transportation Needs: No Transportation Needs (06/23/2022)   PRAPARE - Administrator, Civil Service (Medical): No    Lack of Transportation (Non-Medical): No  Physical Activity: Not on file  Stress: Not on file  Social Connections: Not on file  Intimate Partner Violence: Not At Risk (06/23/2022)   Humiliation, Afraid, Rape, and Kick questionnaire    Fear of Current or Ex-Partner: No    Emotionally Abused: No    Physically Abused: No    Sexually Abused: No   Family History  Problem Relation Age of Onset   Healthy Mother    Healthy Father    Cancer Neg Hx    Diabetes Neg Hx    Heart disease Neg Hx    Hypertension Neg Hx    Stroke Neg Hx    Hearing loss Neg Hx    Asthma Neg Hx    Other Neg Hx        elev prolactin   Allergies  Allergen Reactions   Shellfish  Allergy     PE There were no vitals filed for this visit. There is no height or weight on file to calculate BMI.  Physical Exam    Assessment and Plan:        There are no diagnoses linked to this encounter. Madeline FORBES Pa, CMA

## 2023-12-15 ENCOUNTER — Encounter: Payer: Self-pay | Admitting: Obstetrics and Gynecology

## 2023-12-17 NOTE — ED Provider Notes (Signed)
 [facility]    CSN: [id number] Arrival date & time: 10/24/23  8147      History   Chief Complaint Chief Complaint  Patient presents with   Abdominal Pain    HPI Madeline Wilkins is a 28 y.o. female.   Patient presents today with lower abdominal pain that started about 1 week ago.  She also reports vaginal discharge with a foul odor.  She has been trying cream that she already had for BV with no relief.  Denies dysuria, flank pain, fever, chills.    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   Postpartum care following vaginal delivery 07/08/2022   History of delivery of macrosomal infant 06/23/2022   Hyperprolactinemia 07/20/2020    Past Surgical History:  Procedure Laterality Date   INDUCED ABORTION     WISDOM TOOTH EXTRACTION Bilateral    Bottom Teeth    OB History     Gravida  4   Para  2   Term  2   Preterm  0   AB  2   Living  2      SAB  0   IAB  2   Ectopic  0   Multiple  0   Live Births  2        Obstetric Comments  G2: mid December 2017 in office D&C (elective AB)          Home Medications    Prior to Admission medications   Not on File    Family History Family History  Problem Relation Age of Onset   Healthy Mother    Healthy Father    Cancer Neg Hx    Diabetes Neg Hx    Heart disease Neg Hx    Hypertension Neg Hx    Stroke Neg Hx    Hearing loss Neg Hx    Asthma Neg Hx    Other Neg Hx        elev prolactin     Social History Social History   Tobacco Use   Smoking status: Never   Smokeless tobacco: Never  Vaping Use   Vaping status: Never Used  Substance Use Topics   Alcohol use: Not Currently   Drug use: No     Allergies   Shellfish allergy   Review of Systems Review of Systems  Constitutional:  Negative for chills and fever.  HENT:  Negative for ear pain and sore throat.   Eyes:  Negative for pain and visual disturbance.  Respiratory:  Negative for cough and shortness of breath.   Cardiovascular:  Negative for chest pain and palpitations.  Gastrointestinal:  Positive for abdominal pain. Negative for vomiting.  Genitourinary:  Positive for vaginal discharge. Negative for dysuria and hematuria.  Musculoskeletal:  Negative for arthralgias and back pain.  Skin:  Negative for color change and rash.  Neurological:  Negative for seizures and syncope.  All other systems  reviewed and are negative.    Physical Exam Triage Vital Signs ED Triage Vitals  Encounter Vitals Group     BP 10/24/23 1943 121/81     Girls Systolic BP Percentile --      Girls Diastolic BP Percentile --      Boys Systolic BP Percentile --      Boys Diastolic BP Percentile --      Pulse Rate 10/24/23 1943 77     Resp 10/24/23 1943 16     Temp 10/24/23 1943 98.1 F (36.7 C)     Temp Source 10/24/23 1943 Oral     SpO2 10/24/23 1943 99 %     Weight --      Height --      Head Circumference --      Peak Flow --      Pain Score 10/24/23 1945 6     Pain Loc --      Pain Education --      Exclude from Growth Chart --    No data found.  Updated Vital Signs BP 121/81 (BP Location: Right Arm)   Pulse 77   Temp 98.1 F (36.7 C) (Oral)   Resp 16   LMP 09/08/2023 (Approximate)   SpO2 99%   Breastfeeding Yes   Visual Acuity Right Eye Distance:   Left Eye Distance:   Bilateral Distance:    Right Eye Near:   Left Eye Near:    Bilateral Near:     Physical Exam Vitals and nursing note reviewed.   Constitutional:      General: She is not in acute distress.    Appearance: She is well-developed.  HENT:     Head: Normocephalic and atraumatic.  Eyes:     Conjunctiva/sclera: Conjunctivae normal.  Cardiovascular:     Rate and Rhythm: Normal rate and regular rhythm.     Heart sounds: No murmur heard. Pulmonary:     Effort: Pulmonary effort is normal. No respiratory distress.     Breath sounds: Normal breath sounds.  Abdominal:     Palpations: Abdomen is soft.     Tenderness: There is no abdominal tenderness.  Musculoskeletal:        General: No swelling.     Cervical back: Neck supple.  Skin:    General: Skin is warm and dry.     Capillary Refill: Capillary refill takes less than 2 seconds.  Neurological:     Mental Status: She is alert.  Psychiatric:        Mood and Affect: Mood normal.      UC Treatments / Results  Labs (all labs ordered are listed, but only abnormal results are displayed) Labs Reviewed  POCT URINALYSIS DIP (MANUAL ENTRY) - Abnormal; Notable for the following components:      Result Value   Leukocytes, UA Trace (*)    All other components within normal limits  CERVICOVAGINAL ANCILLARY ONLY - Abnormal; Notable for the following components:   Bacterial Vaginitis (gardnerella) Positive (*)    All other components within normal limits  HIV ANTIBODY (ROUTINE TESTING W REFLEX)  RPR  POCT URINE PREGNANCY    EKG   Radiology No results found.  Procedures Procedures (including critical care time)  Medications Ordered in UC Medications - No data to display  Initial Impression / Assessment and Plan / UC Course  I have reviewed the triage vital signs and the nursing notes.  Pertinent labs & imaging results that were available during my  care of the patient were reviewed by me and considered in my medical decision making (see chart for details).     UA normal and urine pregnancy negative in clinic today.  Cervicovaginal self swab in clinic today.   Results pending.  Will treat if indicated based on these results.  Patient also requesting HIV and RPR in clinic today. Final Clinical Impressions(s) / UC Diagnoses   Final diagnoses:  Vaginosis     Discharge Instructions      Use Metrogel  vaginally as prescribed Will call with test results and change treatment plan if needed Return if no improvement or symptoms become worse.     ED Prescriptions     Medication Sig Dispense Auth. Provider   metroNIDAZOLE  (FLAGYL ) 500 MG tablet  (Status: Discontinued) Take 1 tablet (500 mg total) by mouth 2 (two) times daily for 7 days. 14 tablet Ward, Erikka Follmer Z, PA-C   metroNIDAZOLE  (METROGEL ) 0.75 % gel Apply 1 Application topically 2 (two) times daily for 5 days. 10 g Ward, Harlene PEDLAR, PA-C      PDMP not reviewed this encounter.   Ward, Harlene PEDLAR, PA-C 12/17/23 [phone]

## 2023-12-31 ENCOUNTER — Ambulatory Visit: Admitting: Student

## 2024-01-01 ENCOUNTER — Ambulatory Visit (INDEPENDENT_AMBULATORY_CARE_PROVIDER_SITE_OTHER): Admitting: Student

## 2024-01-01 ENCOUNTER — Encounter: Payer: Self-pay | Admitting: Student

## 2024-01-01 ENCOUNTER — Other Ambulatory Visit (HOSPITAL_COMMUNITY)
Admission: RE | Admit: 2024-01-01 | Discharge: 2024-01-01 | Disposition: A | Discharge status: Home / Self Care | Source: Ambulatory Visit

## 2024-01-01 VITALS — BP 119/90 | HR 85 | Wt 166.0 lb

## 2024-01-01 DIAGNOSIS — Z8759 Personal history of other complications of pregnancy, childbirth and the puerperium: Secondary | ICD-10-CM

## 2024-01-01 DIAGNOSIS — Z113 Encounter for screening for infections with a predominantly sexual mode of transmission: Secondary | ICD-10-CM

## 2024-01-01 NOTE — Progress Notes (Signed)
    SUBJECTIVE:   CHIEF COMPLAINT / HPI:   Madeline Wilkins is a 28 y.o. female presenting for pap smear and follow up for galactorrhea.   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  OBJECTIVE:   BP (!) 119/90   Pulse 85   Wt 166 lb (75.3 kg)   SpO2 97%   BMI 26.00 kg/m   Well-appearing, no acute distress Cardio: Regular rate, regular rhythm, no murmurs on exam. Pulm: Clear, no wheezing, no crackles. No increased work of breathing Abdominal: bowel sounds present, soft, non-tender, non-distended  Pelvic Exam: MA chaperone present  Normal external genitalia No abnormal discharge, currently on menstrual cycle No cervical motion tenderness  Cervix visualized with no lesions    ASSESSMENT/PLAN:   Assessment & Plan Routine screening for STI (sexually transmitted infection) GC, trich collected  HIV, RPR sent  History of galactorrhea Ordered MRI brain w/wo contrast to rule out prolactinoma as previously had documentation for concern in 2023. With loss to follow up will reorder today.      Damien Pinal, DO [facility]

## 2024-01-01 NOTE — Patient Instructions (Addendum)
 I have ordered lab work today. I will send you a message through MyChart or send you a letter with your results. If there is an abnormal result, I will give you a call.    I have ordered and MRI. This will have to be approved by your insurance before you are able to get this scheduled. [facility] will call you once this has been approved.

## 2024-01-02 LAB — HIV ANTIBODY (ROUTINE TESTING W REFLEX): HIV Screen 4th Generation wRfx: NONREACTIVE

## 2024-01-02 LAB — RPR: RPR Ser Ql: NONREACTIVE

## 2024-01-05 LAB — CERVICOVAGINAL ANCILLARY ONLY
Chlamydia: NEGATIVE
Comment: NEGATIVE
Comment: NEGATIVE
Comment: NORMAL
Neisseria Gonorrhea: NEGATIVE
Trichomonas: NEGATIVE

## 2024-01-06 ENCOUNTER — Ambulatory Visit: Payer: Self-pay | Admitting: Student

## 2024-02-02 ENCOUNTER — Other Ambulatory Visit: Payer: Self-pay

## 2024-02-02 ENCOUNTER — Ambulatory Visit (HOSPITAL_COMMUNITY): Admission: EM | Admit: 2024-02-02 | Discharge: 2024-02-02 | Discharge status: Against Medical Advice

## 2024-02-02 NOTE — ED Notes (Signed)
 No answer in lobby.

## 2024-02-02 NOTE — ED Notes (Signed)
 Called pt from lobby no answer X3

## 2024-02-02 NOTE — ED Triage Notes (Addendum)
 SABRA

## 2024-02-02 NOTE — ED Triage Notes (Signed)
PT called with no answer.

## 2024-02-05 ENCOUNTER — Other Ambulatory Visit

## 2024-02-16 ENCOUNTER — Telehealth: Admitting: Family Medicine

## 2024-02-16 NOTE — Progress Notes (Signed)
 Pt had signed in for us  to see her daughter for constipation. Advised we can not see her daughter under her chart and she will need to sign in to be seen through her daughters chart. She understands. DWB
